# Patient Record
Sex: Female | Born: 1955
Health system: Southern US, Community
[De-identification: ages and names within clinical notes are randomized; demographics above are authoritative.]

## PROBLEM LIST (undated history)

## (undated) DIAGNOSIS — F419 Anxiety disorder, unspecified: Secondary | ICD-10-CM

## (undated) DIAGNOSIS — C801 Malignant (primary) neoplasm, unspecified: Secondary | ICD-10-CM

## (undated) DIAGNOSIS — H269 Unspecified cataract: Secondary | ICD-10-CM

## (undated) DIAGNOSIS — Z9889 Other specified postprocedural states: Secondary | ICD-10-CM

## (undated) DIAGNOSIS — F32A Depression, unspecified: Secondary | ICD-10-CM

## (undated) DIAGNOSIS — F329 Major depressive disorder, single episode, unspecified: Secondary | ICD-10-CM

## (undated) DIAGNOSIS — M1990 Unspecified osteoarthritis, unspecified site: Secondary | ICD-10-CM

## (undated) DIAGNOSIS — R112 Nausea with vomiting, unspecified: Secondary | ICD-10-CM

## (undated) DIAGNOSIS — R51 Headache: Secondary | ICD-10-CM

## (undated) HISTORY — PX: FRACTURE SURGERY: SHX138

## (undated) HISTORY — PX: OTHER SURGICAL HISTORY: SHX169

## (undated) HISTORY — DX: Anxiety disorder, unspecified: F41.9

## (undated) HISTORY — PX: TUBAL LIGATION: SHX77

## (undated) HISTORY — DX: Depression, unspecified: F32.A

## (undated) HISTORY — DX: Unspecified cataract: H26.9

## (undated) HISTORY — DX: Headache: R51

## (undated) HISTORY — PX: CHOLECYSTECTOMY: SHX55

## (undated) HISTORY — PX: ROTATOR CUFF REPAIR: SHX139

## (undated) HISTORY — PX: KNEE ARTHROSCOPY W/ MENISCAL REPAIR: SHX1877

## (undated) HISTORY — PX: COLONOSCOPY: SHX174

## (undated) HISTORY — DX: Major depressive disorder, single episode, unspecified: F32.9

## (undated) HISTORY — PX: BUNIONECTOMY: SHX129

---

## 1997-10-05 ENCOUNTER — Ambulatory Visit (HOSPITAL_COMMUNITY): Admission: RE | Admit: 1997-10-05 | Discharge: 1997-10-05 | Payer: Self-pay | Admitting: Obstetrics & Gynecology

## 1998-08-16 ENCOUNTER — Other Ambulatory Visit: Admission: RE | Admit: 1998-08-16 | Discharge: 1998-08-16 | Payer: Self-pay | Admitting: Obstetrics & Gynecology

## 1999-08-27 ENCOUNTER — Ambulatory Visit (HOSPITAL_BASED_OUTPATIENT_CLINIC_OR_DEPARTMENT_OTHER): Admission: RE | Admit: 1999-08-27 | Discharge: 1999-08-27 | Payer: Self-pay | Admitting: Orthopedic Surgery

## 1999-11-23 ENCOUNTER — Other Ambulatory Visit: Admission: RE | Admit: 1999-11-23 | Discharge: 1999-11-23 | Payer: Self-pay | Admitting: Obstetrics & Gynecology

## 2000-12-23 ENCOUNTER — Other Ambulatory Visit: Admission: RE | Admit: 2000-12-23 | Discharge: 2000-12-23 | Payer: Self-pay | Admitting: Obstetrics & Gynecology

## 2001-12-28 ENCOUNTER — Other Ambulatory Visit: Admission: RE | Admit: 2001-12-28 | Discharge: 2001-12-28 | Payer: Self-pay | Admitting: Obstetrics & Gynecology

## 2003-04-18 ENCOUNTER — Other Ambulatory Visit: Admission: RE | Admit: 2003-04-18 | Discharge: 2003-04-18 | Payer: Self-pay | Admitting: Obstetrics & Gynecology

## 2004-04-19 ENCOUNTER — Other Ambulatory Visit: Admission: RE | Admit: 2004-04-19 | Discharge: 2004-04-19 | Payer: Self-pay | Admitting: Obstetrics & Gynecology

## 2004-08-21 ENCOUNTER — Ambulatory Visit (HOSPITAL_COMMUNITY): Admission: RE | Admit: 2004-08-21 | Discharge: 2004-08-21 | Payer: Self-pay | Admitting: Gastroenterology

## 2005-05-29 ENCOUNTER — Other Ambulatory Visit: Admission: RE | Admit: 2005-05-29 | Discharge: 2005-05-29 | Payer: Self-pay | Admitting: Obstetrics & Gynecology

## 2006-01-28 ENCOUNTER — Ambulatory Visit (HOSPITAL_COMMUNITY): Admission: RE | Admit: 2006-01-28 | Discharge: 2006-01-28 | Payer: Self-pay | Admitting: Gastroenterology

## 2006-06-19 ENCOUNTER — Ambulatory Visit: Payer: Self-pay | Admitting: Vascular Surgery

## 2006-12-26 ENCOUNTER — Emergency Department (HOSPITAL_COMMUNITY): Admission: EM | Admit: 2006-12-26 | Discharge: 2006-12-26 | Payer: Self-pay | Admitting: Emergency Medicine

## 2007-07-06 ENCOUNTER — Encounter: Admission: RE | Admit: 2007-07-06 | Discharge: 2007-07-06 | Payer: Self-pay | Admitting: Obstetrics & Gynecology

## 2008-05-02 ENCOUNTER — Emergency Department (HOSPITAL_COMMUNITY): Admission: EM | Admit: 2008-05-02 | Discharge: 2008-05-02 | Payer: Self-pay | Admitting: Family Medicine

## 2008-05-11 ENCOUNTER — Encounter: Admission: RE | Admit: 2008-05-11 | Discharge: 2008-05-11 | Payer: Self-pay | Admitting: Obstetrics & Gynecology

## 2008-08-05 ENCOUNTER — Encounter: Admission: RE | Admit: 2008-08-05 | Discharge: 2008-08-05 | Payer: Self-pay | Admitting: Obstetrics & Gynecology

## 2008-10-28 ENCOUNTER — Emergency Department (HOSPITAL_COMMUNITY): Admission: EM | Admit: 2008-10-28 | Discharge: 2008-10-28 | Payer: Self-pay | Admitting: Family Medicine

## 2009-08-11 ENCOUNTER — Encounter: Admission: RE | Admit: 2009-08-11 | Discharge: 2009-08-11 | Payer: Self-pay | Admitting: Obstetrics & Gynecology

## 2010-02-12 ENCOUNTER — Encounter: Admission: RE | Admit: 2010-02-12 | Discharge: 2010-02-12 | Payer: Self-pay | Admitting: Family Medicine

## 2010-04-08 ENCOUNTER — Encounter: Payer: Self-pay | Admitting: Gastroenterology

## 2010-07-26 ENCOUNTER — Other Ambulatory Visit: Payer: Self-pay | Admitting: Obstetrics & Gynecology

## 2010-07-26 DIAGNOSIS — Z1231 Encounter for screening mammogram for malignant neoplasm of breast: Secondary | ICD-10-CM

## 2010-08-03 NOTE — Op Note (Signed)
[place]  Patient:    Sandra Clay, Sandra Clay                          MRN: [id number] Proc. Date: 08/27/99 Adm. Date:  [id number] Disc. Date: [id number] Attending:  Twana First                           Operative Report  PREOPERATIVE DIAGNOSIS:  Right knee medial meniscus tear.  POSTOPERATIVE DIAGNOSIS:  Right knee medial and lateral meniscal tears.  OPERATION:  Right knee EUA followed by arthroscopic partial medial and lateral meniscectomies.  SURGEON:  Elana Alm. Thurston Hole, M.D.  ASSISTANT:  Kirstin Adelberger, P.A.  ANESTHESIA:  Local MAC  OPERATIVE TIME:  30 minutes  COMPLICATIONS:  None.  INDICATION FOR PROCEDURE:  Ms. Brull is a 55 year old woman who has had six months of increasing right knee pain secondary to a twisting injury with signs and symptoms and MRI documenting meniscal tear who has failed conservative care and is now to undergo arthroscopy.  DESCRIPTION OF PROCEDURE:  Ms. Pellicano was brought to the operating room on August 27, 1999 after a block had been placed in the holding room. Placed on the operative table in the supine position. Right knee was examined under anesthesia.  Full range of motion.  1+ Lachman with a firm end point and an otherwise stable ligamentous exam and normal patellar tracking.  The knee was prepped using sterile Betadine and draped using sterile technique. Originally through an inferior lateral portal the arthroscope with a pump attached was placed and through an inferior medial portal an arthroscopic probe was placed. On initial inspection of the medial compartment, she had grade I and mild grade II changes median femoral condyle and medial tibial plateau which was debrided.  Medial meniscus tear noted posterior horn of which 30% was resected back to a stable rim.  Intercondylar notch inspected.  Anterior cruciate was intact but moderately lax with 3 to 5 mm of anterior laxity but did have an end point.  Posterior cruciate was intact and stable.  Lateral compartment was inspected.  Arterial cartilage, lateral femoral condyle and lateral tibial plateau showed mild grade I to II changes.  Lateral meniscus was probed. She had a tear of the lateral horn of which 20% was resected back to a stable rim. The patellar femoral joint showed mild grade I to II changes.   Patella tracked normally.  Moderate synovitis in the medial lateral gutters were debrided otherwise they are free of pathology.  After this was done and it is felt that all pathology had been satisfactorily addressed, the instruments were removed.  Portal was closed with 3-0 nylon suture after an intra-articular catheter was placed for postoperative pain pump due to the fact that the patient could not tolerate narcotics for pain medication.  We elected to use this postoperative pain pump. The catheter was placed in the inferior medial portal and then the portal was closed with 3-0 nylon suture and then also the joint was injected with 0.25% Marcaine with epinephrine and 5 mg of morphine and then the pump was set up as well. Sterile dressings were applied.  She did receive Ancef 1 gm IV intraoperatively for prophylaxis.  FOLLOW UP CARE:  She will be treated as an outpatient on ibuprofen and with her postoperative pain pump monitored closely.  See her back in the office in a week  for sutures out and follow up. The pain pump will be used for the next 48 hours. DD:  08/27/99 TD:  08/29/99 Job: [id number]

## 2010-08-03 NOTE — Op Note (Signed)
NAMEDIM, MEISINGER                   ACCOUNT NO.:  [id number]   MEDICAL RECORD NO.:  [id number]          PATIENT TYPE:  AMB   LOCATION:  ENDO                         FACILITY:  [facility]   PHYSICIAN:  Bernette Redbird, M.D.   DATE OF BIRTH:  Sep 30, 1955   DATE OF PROCEDURE:  08/21/2004  DATE OF DISCHARGE:                                 OPERATIVE REPORT   PROCEDURE:  Colonoscopy.   INDICATIONS:  Colon cancer screening (initial exam) in a patient without  worrisome risk factors or symptoms.   FINDINGS:  Normal exam to the terminal ileum.   DESCRIPTION OF PROCEDURE:  The nature, purpose and risks of the procedure  had been discussed with the patient who provided written consent. Sedation  was fentanyl 100 mcg and Versed 10 mg IV without arrhythmias or  desaturation. The Olympus adjustable tension pediatric video colonoscope was  advanced to the terminal ileum which had a normal appearance and pullback  was then performed. The quality of the prep was excellent and it is felt  that all areas were well seen.   This was a normal examination. No polyps, cancer, colitis, vascular  malformations or diverticulosis were noted. Retroflexion in the rectum  showed small internal hemorrhoids. Reinspection of the rectum was  unremarkable. No biopsies were obtained. The patient tolerated the procedure  well and there were no apparent complications.   IMPRESSION:  Normal screening colonoscopy.   PLAN:  Consider flexible sigmoidoscopy in five years for continued  screening.      RB/MEDQ  D:  08/21/2004  T:  08/21/2004  Job:  [id number]   cc:   Freddy Finner, M.D.  Fax: [phone]

## 2010-08-14 ENCOUNTER — Ambulatory Visit
Admission: RE | Admit: 2010-08-14 | Discharge: 2010-08-14 | Disposition: A | Discharge status: Home / Self Care | Payer: PRIVATE HEALTH INSURANCE | Source: Ambulatory Visit | Attending: Obstetrics & Gynecology | Admitting: Obstetrics & Gynecology

## 2010-08-14 DIAGNOSIS — Z1231 Encounter for screening mammogram for malignant neoplasm of breast: Secondary | ICD-10-CM

## 2010-12-27 LAB — POCT URINALYSIS DIP (DEVICE)
Ketones, ur: NEGATIVE
Nitrite: POSITIVE — AB
Operator id: 116391
Protein, ur: NEGATIVE
Urobilinogen, UA: 0.2
pH: 6

## 2011-06-26 ENCOUNTER — Other Ambulatory Visit: Payer: Self-pay | Admitting: Internal Medicine

## 2011-09-06 ENCOUNTER — Other Ambulatory Visit: Payer: Self-pay | Admitting: Obstetrics & Gynecology

## 2011-09-06 DIAGNOSIS — Z1231 Encounter for screening mammogram for malignant neoplasm of breast: Secondary | ICD-10-CM

## 2011-09-16 ENCOUNTER — Ambulatory Visit
Admission: RE | Admit: 2011-09-16 | Discharge: 2011-09-16 | Disposition: A | Discharge status: Home / Self Care | Payer: 59 | Source: Ambulatory Visit | Attending: Obstetrics & Gynecology | Admitting: Obstetrics & Gynecology

## 2011-09-16 DIAGNOSIS — Z1231 Encounter for screening mammogram for malignant neoplasm of breast: Secondary | ICD-10-CM

## 2012-05-14 ENCOUNTER — Ambulatory Visit (INDEPENDENT_AMBULATORY_CARE_PROVIDER_SITE_OTHER): Payer: 59 | Admitting: Licensed Clinical Social Worker

## 2012-05-14 ENCOUNTER — Encounter (HOSPITAL_COMMUNITY): Payer: Self-pay | Admitting: Licensed Clinical Social Worker

## 2012-05-14 DIAGNOSIS — F4323 Adjustment disorder with mixed anxiety and depressed mood: Secondary | ICD-10-CM
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
## 2012-05-21 ENCOUNTER — Ambulatory Visit (INDEPENDENT_AMBULATORY_CARE_PROVIDER_SITE_OTHER): Payer: 59 | Admitting: Licensed Clinical Social Worker

## 2012-05-21 DIAGNOSIS — F4323 Adjustment disorder with mixed anxiety and depressed mood: Secondary | ICD-10-CM
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
## 2012-05-28 ENCOUNTER — Ambulatory Visit (INDEPENDENT_AMBULATORY_CARE_PROVIDER_SITE_OTHER): Payer: 59 | Admitting: Licensed Clinical Social Worker

## 2012-05-28 DIAGNOSIS — F4323 Adjustment disorder with mixed anxiety and depressed mood: Secondary | ICD-10-CM
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
## 2012-06-04 ENCOUNTER — Ambulatory Visit (INDEPENDENT_AMBULATORY_CARE_PROVIDER_SITE_OTHER): Payer: 59 | Admitting: Licensed Clinical Social Worker

## 2012-06-04 DIAGNOSIS — F4323 Adjustment disorder with mixed anxiety and depressed mood: Secondary | ICD-10-CM
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
## 2012-06-11 ENCOUNTER — Ambulatory Visit (INDEPENDENT_AMBULATORY_CARE_PROVIDER_SITE_OTHER): Payer: 59 | Admitting: Licensed Clinical Social Worker

## 2012-06-11 DIAGNOSIS — F4323 Adjustment disorder with mixed anxiety and depressed mood: Secondary | ICD-10-CM

## 2012-06-11 NOTE — Progress Notes (Signed)
   THERAPIST PROGRESS NOTE  Session Time: 3:00pm-3:50pm  Participation Level: Active  Behavioral Response: Well GroomedAlertAnxious  Type of Therapy: Individual Therapy  Treatment Goals addressed: Coping  Interventions: CBT, Solution Focused, Strength-based, Supportive, Family Systems and Reframing  Summary: Sandra Clay is a 57 y.o. female who presents with anxious mood and affect. She reports that she only talked to one of her son's and avoided the other one because cutting off his phone feels like she is not loving him. She is willing to focus on this again this week. She feels disconnected from her daughter, feels as though she is neglecting her and has increased anxiety as a result of this. She is now exercising each night and feels good physically, but feels that this is more time spent away from her daughter. She struggles to find a way to express her love towards her daughter without rescuing her. She was able to step back and allow her daughter to experience natural negative consequences for not doing her homework. She felt the desire to call the teacher and the tutor and tell them not to come to the house, but she resisted. Her sleep and appetite are wnl.    Suicidal/Homicidal: Nowithout intent/plan  Therapist Response: Assessed patients current functioning and reviewed progress. Reviewed coping strategies. Assessed patients safety and assisted in identifying protective factors.  Reviewed crisis plan with patient. Assisted patient with the expression of anxiety. Reviewed patients self care plan. Assessed progress related to self care. Patients self care is good. Recommend daily exercise, increased socialization and recreation. Used CBT to assist patient with the identification of negative distortions and irrational thoughts. Encouraged patient to verbalize alternative and factual responses which challenge thought distortions. Reviewed healthy boundaries and assertive communication.  Patient is making consistent progress despite her increased anxiety. She needs frequent reassurance that her change in behavior towards her daughter is not neglectful, but rather an expression of love. Discussed talking to her daughter about the changes in their interactions and communication, to look for opportunities to connect.   Plan: Return again in one weeks.  Diagnosis: Axis I: Adjustment Disorder with Mixed Emotional Features    Axis II: No diagnosis    Alyce Inscore, LCSW 06/11/2012

## 2012-06-18 ENCOUNTER — Ambulatory Visit (INDEPENDENT_AMBULATORY_CARE_PROVIDER_SITE_OTHER): Payer: 59 | Admitting: Licensed Clinical Social Worker

## 2012-06-18 DIAGNOSIS — F4323 Adjustment disorder with mixed anxiety and depressed mood: Secondary | ICD-10-CM
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
## 2012-06-23 ENCOUNTER — Ambulatory Visit (INDEPENDENT_AMBULATORY_CARE_PROVIDER_SITE_OTHER): Payer: 59 | Admitting: Licensed Clinical Social Worker

## 2012-06-23 DIAGNOSIS — F4323 Adjustment disorder with mixed anxiety and depressed mood: Secondary | ICD-10-CM

## 2012-06-23 NOTE — Progress Notes (Signed)
   THERAPIST PROGRESS NOTE  Session Time: 8:30am-9:20am  Participation Level: Active  Behavioral Response: Well GroomedAlertAnxious  Type of Therapy: Individual Therapy  Treatment Goals addressed: Coping  Interventions: CBT, Solution Focused, Strength-based, Supportive and Reframing  Summary: Sandra Clay is a 57 y.o. female who presents with anxious mood and bright affect. She is pleased that she was able to follow up on one of her goals regarding telling her son in [place] that she will no longer pay for his phone bill. She processes her anxiety related to doing this and how she had to couple this with gifts for Easter to tolerate the experience herself.  She reports receiving an orchid from her daughter as a gift and how this is the first time she has ever received anything from her like this. She continues to struggle with the lack of movement in her daughters life. Every night she struggles with an argument about what to eat for dinner. Patient realizes that she tries to offer her daughter many choices for dinner only to feel hurt and frustrated. She wants to find a way to avoid this conflict every night.   Suicidal/Homicidal: Nowithout intent/plan  Therapist Response: Assessed patients current functioning and reviewed progress. Reviewed coping strategies. Assessed patients safety and assisted in identifying protective factors.  Reviewed crisis plan with patient. Assisted patient with the expression of frustration and guilt. Reviewed patients self care plan. Assessed progress related to self care. Patients self care is good. Recommend daily exercise, increased socialization and recreation. Reviewed healthy boundaries and assertive communication. Used CBT to assist patient with the identification of negative distortions and irrational thoughts. Encouraged patient to verbalize alternative and factual responses which challenge thought distortions. Discussed a specific plan of communication to address  dinner time conflict with daughter and put responsibility back on daughter.   Plan: Return again in one weeks.  Diagnosis: Axis I: Adjustment Disorder with Mixed Emotional Features    Axis II: No diagnosis    Antron Seth, LCSW 06/23/2012

## 2012-06-25 ENCOUNTER — Ambulatory Visit (HOSPITAL_COMMUNITY): Payer: Self-pay | Admitting: Licensed Clinical Social Worker

## 2012-07-01 ENCOUNTER — Ambulatory Visit (INDEPENDENT_AMBULATORY_CARE_PROVIDER_SITE_OTHER): Payer: 59 | Admitting: Licensed Clinical Social Worker

## 2012-07-01 DIAGNOSIS — F4323 Adjustment disorder with mixed anxiety and depressed mood: Secondary | ICD-10-CM

## 2012-07-01 NOTE — Progress Notes (Signed)
   THERAPIST PROGRESS NOTE  Session Time: 8:30am-9:20am  Participation Level: Active  Behavioral Response: Well GroomedAlertAnxious  Type of Therapy: Individual Therapy  Treatment Goals addressed: Anxiety and Coping  Interventions: CBT, Solution Focused, Strength-based, Supportive and Reframing  Summary: Sandra Clay is a 57 y.o. female who presents with anxious mood and affect. She reports having a very stressful and emotional week due to poor choices her daughter has made. She describes calling home and finding her daughter drunk or high. She processes her anger and fear over her daughters poor life choices. She processes her doubt that her and her ex-husband may have made the wrong decision to allow their daughter to go to the beach for Spring break. She is angry over how poorly this trip has been handled by the parent in charge. She questions her reactions in comparison to others since other parents are responding quite differently. She feels that she is not a good parent. Her sleep has been disrupted by this stress. Her appetite is wnl.    Suicidal/Homicidal: Nowithout intent/plan  Therapist Response: Assessed patients current functioning and reviewed progress. Reviewed coping strategies. Assessed patients safety and assisted in identifying protective factors.  Reviewed crisis plan with patient. Assisted patient with the expression of anxiety and fear. Reviewed patients self care plan. Assessed progress related to self care. Patients self care is good. Recommend daily exercise, increased socialization and recreation. Used CBT to assist patient with the identification of negative distortions and irrational thoughts. Encouraged patient to verbalize alternative and factual responses which challenge thought distortions. Discussed next steps over holding her daughter responsible for her poor choices. Discussed specific means of communication.   Plan: Return again in one weeks.  Diagnosis: Axis  I: Adjustment Disorder with Mixed Emotional Features    Axis II: No diagnosis    Zeth Buday, LCSW 07/01/2012

## 2012-07-08 ENCOUNTER — Ambulatory Visit (INDEPENDENT_AMBULATORY_CARE_PROVIDER_SITE_OTHER): Payer: 59 | Admitting: Licensed Clinical Social Worker

## 2012-07-08 DIAGNOSIS — F4323 Adjustment disorder with mixed anxiety and depressed mood: Secondary | ICD-10-CM

## 2012-07-08 NOTE — Progress Notes (Signed)
   THERAPIST PROGRESS NOTE  Session Time: 8:30am-9:20am  Participation Level: Active  Behavioral Response: Well GroomedAlertAnxious  Type of Therapy: Individual Therapy  Treatment Goals addressed: Anxiety and Coping  Interventions: CBT, Solution Focused, Supportive and Reframing  Summary: Sandra Clay is a 57 y.o. female who presents with anxious mood and bright affect. She is pleased to report that her daughter came home safely from the beach and describes her efforts to talk to her daughter about her substance abuse. She is uncertain how to proceed with the urine drug screens and plans to discuss this in her daughters session tomorrow with Cleophas Dunker, LMFT. She is pleased that she has been able to connect with her daughter a bit more through cooking and sharing a meal together. She processes her frustration with her boyfriend and how she is not made to feel a priority with him.  She is uncertain how to navigate a major ongoing conflict in their relationship. Her sleep and appetite are wnl.   Suicidal/Homicidal: Nowithout intent/plan  Therapist Response: Assessed patients current functioning and reviewed progress. Reviewed coping strategies. Assessed patients safety and assisted in identifying protective factors.  Reviewed crisis plan with patient. Assisted patient with the expression of frustration and anxiety. Reviewed patients self care plan. Assessed progress related to self care. Patients self care is good. Recommend daily exercise, increased socialization and recreation. Used CBT to assist patient with the identification of negative distortions and irrational thoughts. Encouraged patient to verbalize alternative and factual responses which challenge thought distortions. Reviewed healthy boundaries and assertive communication.   Plan: Return again in one weeks.  Diagnosis: Axis I: Adjustment Disorder with Mixed Emotional Features    Axis II: No diagnosis    Claudie Rathbone,  LCSW 07/08/2012

## 2012-07-15 ENCOUNTER — Ambulatory Visit (INDEPENDENT_AMBULATORY_CARE_PROVIDER_SITE_OTHER): Payer: 59 | Admitting: Licensed Clinical Social Worker

## 2012-07-15 DIAGNOSIS — F4323 Adjustment disorder with mixed anxiety and depressed mood: Secondary | ICD-10-CM

## 2012-07-15 NOTE — Progress Notes (Signed)
   THERAPIST PROGRESS NOTE  Session Time: 8:30am-9:20am  Participation Level: Active  Behavioral Response: Well GroomedAlertAnxious  Type of Therapy: Individual Therapy  Treatment Goals addressed: Anxiety and Coping  Interventions: CBT, Solution Focused, Strength-based, Supportive and Reframing  Summary: Sandra Clay is a 57 y.o. female who presents with euthymic mood and anxious affect. She reports that the situation with her daughter remains the same. She has begun drug testing and her daughter tested positive for THC and amphetamines from [facility]. She is not surprised by this test result. She remains frustrated with her daughters lack of willingness to participate in treatment and she questions what will become of her daughter. She frequently imagines her daughters life as she would like it to be. She continues to experience ongoing frustration in her romantic relationship and endorses growing feelings of resentment and anger. She questions if and how she can feel differently about the situation. She wants to be first in her boyfriends life and is not. She remains busy at work and enjoys her job. Her sleep and appetite are wnl.   Suicidal/Homicidal: Nowithout intent/plan  Therapist Response: Assessed patients current functioning and reviewed progress. Reviewed coping strategies. Assessed patients safety and assisted in identifying protective factors.  Reviewed crisis plan with patient. Assisted patient with the expression of frustration. Reviewed patients self care plan. Assessed progress related to self care. Patients self care is good. Recommend daily exercise, increased socialization and recreation. Reviewed healthy boundaries and assertive communication. Used CBT to assist patient with the identification of negative distortions and irrational thoughts. Encouraged patient to verbalize alternative and factual responses which challenge thought distortions. Patient continues to make strides in  managing her anxiety related to her daughter. She plans to consider expressing herself differently with her boyfriend.   Plan: Return again in one weeks.  Diagnosis: Axis I: Adjustment Disorder with Mixed Emotional Features    Axis II: No diagnosis    Lyn Joens, LCSW 07/15/2012

## 2012-07-23 ENCOUNTER — Telehealth (HOSPITAL_COMMUNITY): Payer: Self-pay

## 2012-07-23 ENCOUNTER — Ambulatory Visit (HOSPITAL_COMMUNITY): Payer: Self-pay | Admitting: Licensed Clinical Social Worker

## 2012-07-31 ENCOUNTER — Ambulatory Visit (INDEPENDENT_AMBULATORY_CARE_PROVIDER_SITE_OTHER): Payer: 59 | Admitting: Licensed Clinical Social Worker

## 2012-07-31 DIAGNOSIS — F4323 Adjustment disorder with mixed anxiety and depressed mood: Secondary | ICD-10-CM

## 2012-07-31 NOTE — Progress Notes (Signed)
   THERAPIST PROGRESS NOTE  Session Time: 8:30am-9:20am  Participation Level: Active  Behavioral Response: Well GroomedAlertAnxious and tearful  Type of Therapy: Individual Therapy  Treatment Goals addressed: Coping  Interventions: CBT, DBT, Strength-based, Supportive and Reframing  Summary: Sandra Clay is a 57 y.o. female who presents with anxious mood and tearful affect. She reports that that the past two weeks have been terrible. Her daughter has quit therapy and patient discovered more drugs in her daughters car, her room and saw that she pawned something on her twitter account. She tried to take away her daughters phone and car but she reports that her daughters sadness and despair are to painful for her to see and experience that she was unable to uphold this consequence. She becomes tearful throughout the session as she describes her fear that her daughter will kill herself in response to patients limit setting. She endorses shame associated with this and feels she is a "bad parent". She endorses feelings of hopelessness and helplessness about this situation. She understands that her daughter needs to attend rehab, but does not know what to do because her daughter refuses to go and patient does not have the financial resources to send her somewhere. She is able to work, but her sleep has been disrupted due to stress and her appetite is wnl.    Suicidal/Homicidal: Nowithout intent/plan  Therapist Response: Assessed patients current functioning and reviewed progress. Reviewed coping strategies. Assessed patients safety and assisted in identifying protective factors.  Reviewed crisis plan with patient. Assisted patient with the expression of fear and anxiety. Reviewed patients self care plan. Assessed progress related to self care. Patients self care is good. Recommend daily exercise, increased socialization and recreation. Reviewed healthy boundaries and assertive communication. Used CBT to  assist patient with the identification of negative distortions and irrational thoughts. Encouraged patient to verbalize alternative and factual responses which challenge thought distortions. Used DBT to practice mindfulness, review distraction list and improve distress tolerance skills. Used motivational interviewing to assist and encourage patient through the change process. Explored patients barriers to change. Patient struggles with distress tolerance. Her fear over her daughters manipulative use of suicidal threats paralyzes and she frequently states that she "can't" do it. Discussed consequences to paitent of allowing her daughter to avoid natural consequences associated with drug use. Patient left with a plan to systematically increase her distress with small steps she feels she can manage.   Plan: Return again in one weeks.  Diagnosis: Axis I: Adjustment Disorder with Mixed Emotional Features    Axis II: No diagnosis    Florabel Faulks, LCSW 07/31/2012

## 2012-08-06 ENCOUNTER — Ambulatory Visit (INDEPENDENT_AMBULATORY_CARE_PROVIDER_SITE_OTHER): Payer: 59 | Admitting: Licensed Clinical Social Worker

## 2012-08-06 DIAGNOSIS — F4323 Adjustment disorder with mixed anxiety and depressed mood: Secondary | ICD-10-CM

## 2012-08-06 NOTE — Progress Notes (Signed)
   THERAPIST PROGRESS NOTE  Session Time: 4:00pm-4:50pm  Participation Level: Active  Behavioral Response: Well GroomedAlertAnxious and Euthymic  Type of Therapy: Individual Therapy  Treatment Goals addressed: Coping  Interventions: CBT, DBT, Strength-based, Supportive and Reframing  Summary: Sandra Clay is a 57 y.o. female who presents with euthymic mood and anxious affect. She reports that she is pleased with the progress she is making. She has been focusing on increasing her distress tolerance skills and tolerating her daughters unhappiness. She admits that this will be easier in the summer because she her daughter won't be in school and she won't be concerned about "runing her day". She admits that this is an excuse. She processes her frustration and hopelessness over her daughter. She expresses fear that she will one day find her dead. She wants to bring her boyfriend in next session and talk about how she feels he judges her daughter and how painful that is for her. Her sleep and appetite are wnl.    Suicidal/Homicidal: Nowithout intent/plan  Therapist Response: Assessed patients current functioning and reviewed progress. Reviewed coping strategies. Assessed patients safety and assisted in identifying protective factors.  Reviewed crisis plan with patient. Assisted patient with the expression of fear and anxiety. Reviewed patients self care plan. Assessed progress related to self care. Patients self care is good. Recommend daily exercise, increased socialization and recreation. Used CBT to assist patient with the identification of negative distortions and irrational thoughts. Encouraged patient to verbalize alternative and factual responses which challenge thought distortions. Used DBT to practice mindfulness, review distraction list and improve distress tolerance skills. Reviewed healthy boundaries and assertive communication.   Plan: Return again in one weeks.  Diagnosis: Axis I:  Adjustment Disorder with Mixed Emotional Features    Axis II: Deferred    Firmin Belisle, LCSW 08/06/2012

## 2012-08-12 ENCOUNTER — Ambulatory Visit (INDEPENDENT_AMBULATORY_CARE_PROVIDER_SITE_OTHER): Payer: 59 | Admitting: Licensed Clinical Social Worker

## 2012-08-12 DIAGNOSIS — F4323 Adjustment disorder with mixed anxiety and depressed mood: Secondary | ICD-10-CM

## 2012-08-12 NOTE — Progress Notes (Signed)
   THERAPIST PROGRESS NOTE  Session Time: 8:30am-9:20am  Participation Level: Active  Behavioral Response: Well GroomedAlertAnxious  Type of Therapy: Family Therapy  Treatment Goals addressed: Coping  Interventions: CBT, DBT, Strength-based, Supportive and Reframing  Summary: Sandra Clay is a 57 y.o. female who presents with her boyfriend Sandra Clay. She has brought him along today to discuss their relationship and how it is impacted by her daughter. Patient reports that her daughter continues with the same behavior. She endorses feelings helplessness and is concerned about how this impacts her relationship with Sandra Clay. She has spoken to him about his frustration and expression of this which upsets her at times. Both patient and Sandra Clay process how they are impacted, how their time together is limited and driven by [name]. Patient expresses concern that this stress will drive Sandra Clay away. She is able to express her need for reassurance. Patient expresses her continued struggle to tolerate her daughters pain.   Suicidal/Homicidal: Nowithout intent/plan  Therapist Response: Assessed patients current functioning and reviewed progress. Reviewed coping strategies. Assessed patients safety and assisted in identifying protective factors.  Reviewed crisis plan with patient. Assisted patient with the expression of anxiety and fear. Reviewed patients self care plan. Assessed progress related to self care. Patients self care is good. Recommend daily exercise, increased socialization and recreation. Used CBT to assist patient with the identification of negative distortions and irrational thoughts. Encouraged patient to verbalize alternative and factual responses which challenge thought distortions. Used DBT to practice mindfulness, review distraction list and improve distress tolerance skills. Reviewed healthy boundaries and assertive communication. Processed and normalized patients grief reaction.   Plan: Return  again in one weeks.  Diagnosis: Axis I: Adjustment Disorder with Mixed Emotional Features    Axis II: No diagnosis    Willoughby Doell, LCSW 08/12/2012

## 2012-08-19 ENCOUNTER — Ambulatory Visit (INDEPENDENT_AMBULATORY_CARE_PROVIDER_SITE_OTHER): Payer: 59 | Admitting: Licensed Clinical Social Worker

## 2012-08-19 DIAGNOSIS — F4323 Adjustment disorder with mixed anxiety and depressed mood: Secondary | ICD-10-CM

## 2012-08-19 NOTE — Progress Notes (Signed)
   THERAPIST PROGRESS NOTE  Session Time: 8:30am-9:20am  Participation Level: Active  Behavioral Response: Well GroomedAlertAnxious and Euthymic  Type of Therapy: Individual Therapy  Treatment Goals addressed: Coping  Interventions: CBT, DBT, Motivational Interviewing, Solution Focused, Strength-based, Supportive and Reframing  Summary: Sandra Clay is a 57 y.o. female who presents with euthymic mood and anxious affect. She reports that both her and her boyfriend found the last session very helpful. She has an improved understanding of her boyfriends intentions and frustrations and is able to understand that his frustration does not reflect upon his feelings about her daughter. Patient processes her frustration with her boyfriend's poor attempts to assert himself on patients behalf in front of his ex wife and children. She explores her options and how she plans to interact with his children when they are on vacation together. She continues to focus on communication of boundaries and expectations with her daughter. She has a desire to "make things happen" for her over the summer, but is able to redirect her intentions to the realization that her daughter is responsible for her own wellness. She seeks assistance with communicating her expectations about her daughters drug use and how she does not clean up after herself. She is sleeping and eating well.    Suicidal/Homicidal: Nowithout intent/plan  Therapist Response: Assessed patients current functioning and reviewed progress. Reviewed coping strategies. Assessed patients safety and assisted in identifying protective factors.  Reviewed crisis plan with patient. Assisted patient with the expression of frustration. Reviewed patients self care plan. Assessed progress related to self care. Patients self care is good. Recommend daily exercise, increased socialization and recreation. Used CBT to assist patient with the identification of negative  distortions and irrational thoughts. Encouraged patient to verbalize alternative and factual responses which challenge thought distortions. Reviewed healthy boundaries and assertive communication. Used DBT to practice mindfulness, review distraction list and improve distress tolerance skills. Patient is developing improved distress tolerance skills for her daughters sadness.   Plan: Return again in one weeks.  Diagnosis: Axis I: Adjustment Disorder with Mixed Emotional Features    Axis II: No diagnosis    Emmarae Cowdery, LCSW 08/19/2012

## 2012-08-27 ENCOUNTER — Ambulatory Visit (HOSPITAL_COMMUNITY): Payer: Self-pay | Admitting: Licensed Clinical Social Worker

## 2012-09-03 ENCOUNTER — Ambulatory Visit (INDEPENDENT_AMBULATORY_CARE_PROVIDER_SITE_OTHER): Payer: 59 | Admitting: Licensed Clinical Social Worker

## 2012-09-03 DIAGNOSIS — F4323 Adjustment disorder with mixed anxiety and depressed mood: Secondary | ICD-10-CM

## 2012-09-03 NOTE — Progress Notes (Signed)
   THERAPIST PROGRESS NOTE  Session Time: 8:30am-9:20am  Participation Level: Active  Behavioral Response: Well GroomedAlertAnxious  Type of Therapy: Individual Therapy  Treatment Goals addressed: Coping  Interventions: CBT, Strength-based, Supportive and Reframing  Summary: Sandra Clay is a 57 y.o. female who presents with anxious mood and affect. She reports that things are about the same at home and she continues to experience stress related to her daughter's depression and her lack of motivation to make changes in her life. She continues to struggle with tolerance around her daughters sadness, but she is making progress in this area. She wants to get her daughter involved in activities over the summer and has come up with a spread sheet outlining her goals for her. She is eager to see her daughter make changes that her daughter is not interested in making. She struggles to adjust her hopes for her daughter to her daughters readiness for change. Her sleep and appetite are wnl.   Suicidal/Homicidal: Nowithout intent/plan  Therapist Response: Assessed patients current functioning and reviewed progress. Reviewed coping strategies. Assessed patients safety and assisted in identifying protective factors.  Reviewed crisis plan with patient. Assisted patient with the expression of frustration. Reviewed patients self care plan. Assessed progress related to self care. Patients self care is good. Recommend daily exercise, increased socialization and recreation. Reviewed healthy boundaries and assertive communication. Used CBT to assist patient with the identification of negative distortions and irrational thoughts. Encouraged patient to verbalize alternative and factual responses which challenge thought distortions. Used DBT to practice mindfulness, review distraction list and improve distress tolerance skills.   Plan: Return again in one weeks.  Diagnosis: Axis I: Adjustment Disorder with Mixed  Emotional Features    Axis II: No diagnosis    Joban Colledge, LCSW 09/03/2012

## 2012-09-10 ENCOUNTER — Ambulatory Visit (HOSPITAL_COMMUNITY): Payer: Self-pay | Admitting: Licensed Clinical Social Worker

## 2012-09-17 ENCOUNTER — Ambulatory Visit (INDEPENDENT_AMBULATORY_CARE_PROVIDER_SITE_OTHER): Payer: 59 | Admitting: Licensed Clinical Social Worker

## 2012-09-17 DIAGNOSIS — F4323 Adjustment disorder with mixed anxiety and depressed mood: Secondary | ICD-10-CM

## 2012-09-17 NOTE — Progress Notes (Signed)
   THERAPIST PROGRESS NOTE  Session Time: 8:30am-9:20am  Participation Level: Active  Behavioral Response: Well GroomedAlertAnxious  Type of Therapy: Individual Therapy  Treatment Goals addressed: Coping  Interventions: CBT, DBT, Play Therapy, Supportive and Reframing  Summary: Sandra Clay is a 57 y.o. female who presents with euthymic mood and anxious affect. She reports that her and her daughter went on a vacation and that she was surprised by her daughters positive response many times during the vacation. She remains concerned and fearful for her daughters well being and her future. She is busy at work and endorses stress related to this. She endorses anxiety, sadness and frustration with her boyfriend and this lack of clear commitment. She processes her fear to allow him to see how much she is hurt by his actions as she is fearful that he will leave. She continues to work on building distress tolerance skills. Her sleep and appetite are wnl.    Suicidal/Homicidal: Nowithout intent/plan  Therapist Response: Assessed patients current functioning and reviewed progress. Reviewed coping strategies. Assessed patients safety and assisted in identifying protective factors.  Reviewed crisis plan with patient. Assisted patient with the expression of anxiety and fear. Reviewed patients self care plan. Assessed progress related to self care. Patients self care is very good. Recommend daily exercise, increased socialization and recreation. Reviewed healthy boundaries and assertive communication. Used CBT to assist patient with the identification of negative distortions and irrational thoughts. Encouraged patient to verbalize alternative and factual responses which challenge thought distortions. Used DBT to practice mindfulness, review distraction list and improve distress tolerance skills.   Plan: Return again in one weeks.  Diagnosis: Axis I: Adjustment Disorder with Mixed Emotional Features    Axis  II: No diagnosis    [name], LCSW 09/17/2012

## 2012-09-23 ENCOUNTER — Ambulatory Visit (INDEPENDENT_AMBULATORY_CARE_PROVIDER_SITE_OTHER): Payer: 59 | Admitting: Licensed Clinical Social Worker

## 2012-09-23 DIAGNOSIS — F4323 Adjustment disorder with mixed anxiety and depressed mood: Secondary | ICD-10-CM

## 2012-09-23 NOTE — Progress Notes (Signed)
   THERAPIST PROGRESS NOTE  Session Time: 9:30am-10:20am  Participation Level: Active  Behavioral Response: Well GroomedAlertAnxious  Type of Therapy: Individual Therapy  Treatment Goals addressed: Coping  Interventions: CBT, Solution Focused, Strength-based, Supportive and Reframing  Summary: Sandra Clay is a 57 y.o. female who presents with anxious mood and affect. She endorses anxiety as she prepares to go on vacation with her boyfriend and his children. She processes her frustration with her boyfriends behavior when around his children. She finds herself growing more intolerant of having two different lives and his refusal to fully integrate his life into her's. She reports realizing that she thinks about this "all the time" and how exhausting this is for her. Her tolerance for her daughters sadness is improving. She expresses concern over her daughters desire to return to [employer]. She explores new boundaries she wants to uphold and not to rescue her daughter from poor decisions. Her sleep and appetite are wnl.    Suicidal/Homicidal: Nowithout intent/plan  Therapist Response: Assessed patients current functioning and reviewed progress. Reviewed coping strategies. Assessed patients safety and assisted in identifying protective factors.  Reviewed crisis plan with patient. Assisted patient with the expression of anxiety and frustration. Reviewed patients self care plan. Assessed progress related to self care. Patients self care is very good. Recommend daily exercise, increased socialization and recreation. Used CBT to assist patient with the identification of negative distortions and irrational thoughts. Encouraged patient to verbalize alternative and factual responses which challenge thought distortions. Used DBT to practice mindfulness, review distraction list and improve distress tolerance skills. Reviewed healthy boundaries and assertive communication.   Plan: Return again in two  weeks.  Diagnosis: Axis I: Adjustment Disorder with Mixed Emotional Features    Axis II: No diagnosis    Danyele Smejkal, LCSW 09/23/2012

## 2012-09-24 ENCOUNTER — Other Ambulatory Visit: Payer: Self-pay

## 2012-09-24 DIAGNOSIS — Z1231 Encounter for screening mammogram for malignant neoplasm of breast: Secondary | ICD-10-CM

## 2012-10-09 ENCOUNTER — Ambulatory Visit
Admission: RE | Admit: 2012-10-09 | Discharge: 2012-10-09 | Disposition: A | Discharge status: Home / Self Care | Payer: 59 | Source: Ambulatory Visit

## 2012-10-09 DIAGNOSIS — Z1231 Encounter for screening mammogram for malignant neoplasm of breast: Secondary | ICD-10-CM

## 2012-10-19 ENCOUNTER — Ambulatory Visit (INDEPENDENT_AMBULATORY_CARE_PROVIDER_SITE_OTHER): Payer: 59 | Admitting: Licensed Clinical Social Worker

## 2012-10-19 DIAGNOSIS — F4323 Adjustment disorder with mixed anxiety and depressed mood: Secondary | ICD-10-CM

## 2012-10-19 NOTE — Progress Notes (Signed)
   THERAPIST PROGRESS NOTE  Session Time: 9:30am-10:20am  Participation Level: Active  Behavioral Response: Well GroomedAlertAnxious and Euthymic  Type of Therapy: Individual Therapy  Treatment Goals addressed: Coping  Interventions: CBT, Motivational Interviewing, Strength-based, Supportive and Reframing  Summary: Sandra Clay is a 57 y.o. female who presents with euthymic mood and bright affect. She has recently returned from a vacation with her boyfriend and his family and is pleased that this went much better than she expected. She got engaged, but it has not been made public yet. She feels patient and compassionate towards her fiancee's need for time to deal with his ex-wife. She processes her time spent with his children and her improved confidence. She continues to experience frustration with her daughter and endorses feelings of helplessness. She fears that once her daughter turns 15, she will leave home. Processed ways in which she enables her daughters drug use. Her sleep and appetite are wnl.    Suicidal/Homicidal: Nowithout intent/plan  Therapist Response: Assessed patients current functioning and reviewed progress. Reviewed coping strategies. Assessed patients safety and assisted in identifying protective factors.  Reviewed crisis plan with patient. Assisted patient with the expression of anxeity. Reviewed patients self care plan. Assessed progress related to self care. Patients self care is good. Recommend daily exercise, increased socialization and recreation. Reviewed healthy boundaries and assertive communication. Used CBT to assist patient with the identification of negative distortions and irrational thoughts. Encouraged patient to verbalize alternative and factual responses which challenge thought distortions. Used motivational interviewing to assist and encourage patient through the change process. Explored patients barriers to change.   Plan: Return again in one  weeks.  Diagnosis: Axis I: Adjustment Disorder with Mixed Emotional Features    Axis II: No diagnosis    Brinden Kincheloe, LCSW 10/19/2012

## 2012-11-04 ENCOUNTER — Ambulatory Visit (INDEPENDENT_AMBULATORY_CARE_PROVIDER_SITE_OTHER): Payer: 59 | Admitting: Licensed Clinical Social Worker

## 2012-11-04 DIAGNOSIS — F4323 Adjustment disorder with mixed anxiety and depressed mood: Secondary | ICD-10-CM

## 2012-11-04 NOTE — Progress Notes (Signed)
   THERAPIST PROGRESS NOTE  Session Time: 8:30am-9:20am  Participation Level: Active  Behavioral Response: Well GroomedAlertAnxious  Type of Therapy: Individual Therapy  Treatment Goals addressed: Coping  Interventions: CBT, Strength-based, Supportive and Reframing  Summary: Sandra Clay is a 57 y.o. female who presents with anxious mood and affect. She reports an increase in anxiety related to parenting her daughter and endorses strong negative self talk about being incompetent as a mother. She processes her ongoing frustration about the right choices to make and how to set and uphold firm and clear limits with her daughter. She questions if the limits she has set are clear and firm enough. She questions if she is sending her daughter any wrong messages. She is taking good care of herself and her relationship with her fiancee is going well. She processes some anxiety related to this and to his follow through on commitment. Her sleep and appetite are wnl.    Suicidal/Homicidal: Nowithout intent/plan  Therapist Response: Assessed patients current functioning and reviewed progress. Reviewed coping strategies. Assessed patients safety and assisted in identifying protective factors.  Reviewed crisis plan with patient. Assisted patient with the expression of anxiety. Reviewed patients self care plan. Assessed progress related to self care. Patients self care is good. Recommend daily exercise, increased socialization and recreation. Reviewed healthy boundaries and assertive communication. Used CBT to assist patient with the identification of negative distortions and irrational thoughts. Encouraged patient to verbalize alternative and factual responses which challenge thought distortions. Used motivational interviewing to assist and encourage patient through the change process. Explored patients barriers to change.   Plan: Return again in two weeks.  Diagnosis: Axis I: Adjustment Disorder with Mixed  Emotional Features    Axis II: No diagnosis    Esmay Amspacher, LCSW 11/04/2012

## 2012-11-18 ENCOUNTER — Ambulatory Visit (INDEPENDENT_AMBULATORY_CARE_PROVIDER_SITE_OTHER): Payer: 59 | Admitting: Licensed Clinical Social Worker

## 2012-11-18 DIAGNOSIS — F4323 Adjustment disorder with mixed anxiety and depressed mood: Secondary | ICD-10-CM

## 2012-11-18 NOTE — Progress Notes (Signed)
   THERAPIST PROGRESS NOTE  Session Time: 8:30am-9:20am  Participation Level: Active  Behavioral Response: Well GroomedAlertAnxious and Euthymic  Type of Therapy: Individual Therapy  Treatment Goals addressed: Coping  Interventions: CBT, DBT, Strength-based, Supportive and Reframing  Summary: Sandra Clay is a 57 y.o. female who presents with euthymic mood and anxious affect. She reports ongoing anxiety and stress related to her daughter. She processes her frustration with her daughters lack of motivation and willingness to accept so little for herself. Patient continues to work on developing tolerance for her own anxiety related to her daughters condition. She is pleased that her fiancee is making progress related to moving in and she trusts his motivation and momentum. She endorses some work related stress but is able to manage this. She processes her grief related to her daughters high school experience and her hopes. Her sleep and appetite are wnl.    Suicidal/Homicidal: Nowithout intent/plan  Therapist Response: Assessed patients current functioning and reviewed progress. Reviewed coping strategies. Assessed patients safety and assisted in identifying protective factors.  Reviewed crisis plan with patient. Assisted patient with the expression of anxiety. Reviewed patients self care plan. Assessed progress related to self care. Patients self care is good. Recommend daily exercise, increased socialization and recreation. Used CBT to assist patient with the identification of negative distortions and irrational thoughts. Encouraged patient to verbalize alternative and factual responses which challenge thought distortions. Used DBT to practice mindfulness, review distraction list and improve distress tolerance skills. Reviewed healthy boundaries and assertive communication. Used motivational interviewing to assist and encourage patient through the change process. Explored patients barriers to  change.   Plan: Return again in two weeks.  Diagnosis: Axis I: Adjustment Disorder with Mixed Emotional Features    Axis II: No diagnosis    Seona Clemenson, LCSW 11/18/2012

## 2012-12-02 ENCOUNTER — Ambulatory Visit (INDEPENDENT_AMBULATORY_CARE_PROVIDER_SITE_OTHER): Payer: 59 | Admitting: Licensed Clinical Social Worker

## 2012-12-02 DIAGNOSIS — F4323 Adjustment disorder with mixed anxiety and depressed mood: Secondary | ICD-10-CM

## 2012-12-02 NOTE — Progress Notes (Signed)
   THERAPIST PROGRESS NOTE  Session Time: 8:30am-9:20am  Participation Level: Active  Behavioral Response: Well GroomedAlertAnxious and Euthymic  Type of Therapy: Individual Therapy  Treatment Goals addressed: Coping  Interventions: CBT, Strength-based, Supportive and Reframing  Summary: Sandra Clay is a 57 y.o. female who presents with euthymic mood and bright affect. She reports that she is doing well with some work related stress and anxiety. She processes her ongoing frustration with her daughter, but is pleased that she is attending school regularly without any argument. She notices decreased irritability in her daughter since she discontinued all her medication. Patient endorses difficulty dealing with her daughters lack of involvement in the world and her limited social interactions. She wants desperately for her daughter to demonstrate improved motivation, but patient is developing improved distress tolerance. Her sleep and appetite are wnl.    Suicidal/Homicidal: Nowithout intent/plan  Therapist Response: Assessed patients current functioning and reviewed progress. Reviewed coping strategies. Assessed patients safety and assisted in identifying protective factors.  Reviewed crisis plan with patient. Assisted patient with the expression of anxiety. Reviewed patients self care plan. Assessed progress related to self care. Patients self care is very good. Recommend daily exercise, increased socialization and recreation. Used CBT to assist patient with the identification of negative distortions and irrational thoughts. Encouraged patient to verbalize alternative and factual responses which challenge thought distortions. Reviewed healthy boundaries and assertive communication.   Plan: Return again in two weeks.  Diagnosis: Axis I: Adjustment Disorder with Mixed Emotional Features    Axis II: No diagnosis    Arvid Marengo, LCSW 12/02/2012

## 2012-12-16 ENCOUNTER — Ambulatory Visit (INDEPENDENT_AMBULATORY_CARE_PROVIDER_SITE_OTHER): Payer: 59 | Admitting: Licensed Clinical Social Worker

## 2012-12-16 DIAGNOSIS — F4323 Adjustment disorder with mixed anxiety and depressed mood: Secondary | ICD-10-CM

## 2012-12-16 NOTE — Progress Notes (Signed)
   THERAPIST PROGRESS NOTE  Session Time: 8:30am-9:20am  Participation Level: Active  Behavioral Response: Well GroomedAlertAnxious and Euthymic  Type of Therapy: Individual Therapy  Treatment Goals addressed: Coping  Interventions: CBT, Strength-based, Supportive and Reframing  Summary: Sandra Clay is a 57 y.o. female who presents with euthymic mood and bright affect. She reports that she is doing well, with continued, but well managed anxiety related to her daughter and her job. She processes her surprise that her daughters behavior and mood have dramatically improved since she stopped all her medication. She is concerned that her daughter continues to smoke marijuana and has communicated that to her. She processes her grief and sadness over the loss of milestones that both her and her daughter would have experienced while she is in [employer]. She is taking very good care of herself. Her sleep and appetite are wnl.    Suicidal/Homicidal: Nowithout intent/plan  Therapist Response: Assessed patients current functioning and reviewed progress. Reviewed coping strategies. Assessed patients safety and assisted in identifying protective factors.  Reviewed crisis plan with patient. Assisted patient with the expression of anxeity. Reviewed patients self care plan. Assessed progress related to self care. Patients self care is good. Recommend daily exercise, increased socialization and recreation. Used CBT to assist patient with the identification of negative distortions and irrational thoughts. Encouraged patient to verbalize alternative and factual responses which challenge thought distortions. Reviewed healthy boundaries and assertive communication.   Plan: Return again in two weeks.  Diagnosis: Axis I: Adjustment Disorder with Mixed Emotional Features    Axis II: No diagnosis    Sandra Fabela, LCSW 12/16/2012

## 2012-12-31 ENCOUNTER — Ambulatory Visit (INDEPENDENT_AMBULATORY_CARE_PROVIDER_SITE_OTHER): Payer: 59 | Admitting: Licensed Clinical Social Worker

## 2012-12-31 ENCOUNTER — Encounter (INDEPENDENT_AMBULATORY_CARE_PROVIDER_SITE_OTHER): Payer: Self-pay

## 2012-12-31 DIAGNOSIS — F4323 Adjustment disorder with mixed anxiety and depressed mood: Secondary | ICD-10-CM

## 2012-12-31 NOTE — Progress Notes (Signed)
   THERAPIST PROGRESS NOTE  Session Time: 9:30am-10:20am  Participation Level: Active  Behavioral Response: Well GroomedAlertAnxious  Type of Therapy: Individual Therapy  Treatment Goals addressed: Coping  Interventions: CBT, Motivational Interviewing, Strength-based, Supportive and Reframing  Summary: Sandra Clay is a 57 y.o. female who presents with euthymic mood and anxious affect. She reports increased anxiety about her daughter and reports that her daughter has come home the last three nights under the influence of either drugs or alcohol or both. She is angry that her daughter is driving under the influence and she knows she must set firmer limits. She reports realizing that her life would be better if her daughter did not live with her so she didn't have to watch her destroy herself. She struggles with distorted thinking, self doubt about her decisions as a mother and fear that her daughter will die. She is able to formulate a plan where in she will verbally set firmer boundaries with daughter, which will include taking the car away and eventually talking to her about moving out if she does not stop using drugs and alcohol. Her sleep has been poor lately and her appetite is wnl.  Suicidal/Homicidal: Nowithout intent/plan  Therapist Response: Assessed patients current functioning and reviewed progress. Reviewed coping strategies. Assessed patients safety and assisted in identifying protective factors.  Reviewed crisis plan with patient. Assisted patient with the expression of anxiety. Reviewed patients self care plan. Assessed progress related to self care. Patients self care is good. Recommend daily exercise, increased socialization and recreation. Used CBT to assist patient with the identification of negative distortions and irrational thoughts. Encouraged patient to verbalize alternative and factual responses which challenge thought distortions. Reviewed healthy boundaries and assertive  communication. Used motivational interviewing to assist and encourage patient through the change process. Explored patients barriers to change.   Plan: Return again in two weeks.  Diagnosis: Axis I: Adjustment Disorder with Mixed Emotional Features    Axis II: No diagnosis    Jullian Previti, LCSW 12/31/2012

## 2013-01-13 ENCOUNTER — Ambulatory Visit (INDEPENDENT_AMBULATORY_CARE_PROVIDER_SITE_OTHER): Payer: 59 | Admitting: Licensed Clinical Social Worker

## 2013-01-13 DIAGNOSIS — F4323 Adjustment disorder with mixed anxiety and depressed mood: Secondary | ICD-10-CM

## 2013-01-13 NOTE — Progress Notes (Signed)
   THERAPIST PROGRESS NOTE  Session Time: 8:30am-9:20am  Participation Level: Active  Behavioral Response: Well GroomedAlertAnxious and Irritable  Type of Therapy: Individual Therapy  Treatment Goals addressed: Coping  Interventions: CBT, Strength-based, Supportive and Reframing  Summary: Sandra Clay is a 57 y.o. female who presents with irritable mood and frustrated affect. She is upset over billing issues related to her appointments here. She says she was prepared to come in today and cancel all her future appointments because of the billing problems. She processes her anxiety related to financial issues, that she feels out of control and that she feels threatened. She questions herself and immediately blames herself for any financial problems which may not be her responsibility. She is hard on herself but responds well to reframing and redirection. She feels she is making progress with her daughter by forcing her daughter to make her own choices, rather than seek approval from patient for things which she disagrees with. Her sleep and appetite are wnl.   Suicidal/Homicidal: Nowithout intent/plan  Therapist Response: Assessed patients current functioning and reviewed progress. Reviewed coping strategies. Assessed patients safety and assisted in identifying protective factors.  Reviewed crisis plan with patient. Assisted patient with the expression of frustration and anger. Reviewed patients self care plan. Assessed progress related to self care. Patients self care is good. Recommend daily exercise, increased socialization and recreation. Reviewed healthy boundaries and assertive communication. Used CBT to assist patient with the identification of negative distortions and irrational thoughts. Encouraged patient to verbalize alternative and factual responses which challenge thought distortions. Used motivational interviewing to assist and encourage patient through the change process. Explored  patients barriers to change.   Plan: Return again in two weeks.  Diagnosis: Axis I: Adjustment Disorder with Mixed Emotional Features    Axis II: No diagnosis    Nils Thor, LCSW 01/13/2013

## 2013-01-27 ENCOUNTER — Ambulatory Visit (INDEPENDENT_AMBULATORY_CARE_PROVIDER_SITE_OTHER): Payer: 59 | Admitting: Licensed Clinical Social Worker

## 2013-01-27 DIAGNOSIS — F4323 Adjustment disorder with mixed anxiety and depressed mood: Secondary | ICD-10-CM

## 2013-01-27 NOTE — Progress Notes (Signed)
   THERAPIST PROGRESS NOTE  Session Time: 8:30am-9:20am  Participation Level: Active  Behavioral Response: Well GroomedAlertEuthymic  Type of Therapy: Individual Therapy  Treatment Goals addressed: Coping  Interventions: CBT, Strength-based, Supportive and Reframing  Summary: Sandra Clay is a 57 y.o. female who presents with euthymic mood and bright affect. She reports that she is doing well, managing her anxiety well and feels that she has made significant progress in therapy. She remains frustrated with her daughter's life choices, but she is better able to manage her expectations. She processes her frustration with her boyfriend and how he expects her to wait for him. She explores her disappointment and action she may take regarding this relationship. Her sleep and appetite are wnl.   Suicidal/Homicidal: Nowithout intent/plan  Therapist Response: Assessed patients current functioning and reviewed progress. Reviewed coping strategies. Assessed patients safety and assisted in identifying protective factors.  Reviewed crisis plan with patient. Assisted patient with the expression of anxiety and frustration. Reviewed patients self care plan. Assessed progress related to self care. Patients self care is good. Recommend daily exercise, increased socialization and recreation. Used CBT to assist patient with the identification of negative distortions and irrational thoughts. Encouraged patient to verbalize alternative and factual responses which challenge thought distortions. Reviewed healthy boundaries and assertive communication.   Plan: Return again in four weeks.  Diagnosis: Axis I: Adjustment Disorder with Mixed Emotional Features    Axis II: No diagnosis    Jolane Bankhead, LCSW 01/27/2013

## 2013-02-10 ENCOUNTER — Ambulatory Visit (HOSPITAL_COMMUNITY): Payer: Self-pay | Admitting: Licensed Clinical Social Worker

## 2013-02-24 ENCOUNTER — Ambulatory Visit (HOSPITAL_COMMUNITY): Payer: Self-pay | Admitting: Licensed Clinical Social Worker

## 2013-03-09 ENCOUNTER — Ambulatory Visit (HOSPITAL_COMMUNITY): Payer: Self-pay | Admitting: Licensed Clinical Social Worker

## 2013-03-24 ENCOUNTER — Ambulatory Visit (HOSPITAL_COMMUNITY): Payer: Self-pay | Admitting: Licensed Clinical Social Worker

## 2013-04-22 ENCOUNTER — Ambulatory Visit (INDEPENDENT_AMBULATORY_CARE_PROVIDER_SITE_OTHER): Payer: 59 | Admitting: Licensed Clinical Social Worker

## 2013-04-22 DIAGNOSIS — F4323 Adjustment disorder with mixed anxiety and depressed mood: Secondary | ICD-10-CM

## 2013-04-22 NOTE — Progress Notes (Signed)
   THERAPIST PROGRESS NOTE  Session Time: 4:00pm-4:50pm  Participation Level: Active  Behavioral Response: Well GroomedAlertAnxious  Type of Therapy: Individual Therapy  Treatment Goals addressed: Coping  Interventions: CBT, Strength-based, Supportive and Reframing  Summary: ESHAAL DUBY is a 57 y.o. female who presents with anxious mood and affect. Patient reports an increase in feelings of anger towards her children, her fiancee and her job. She is angry that her life is negatively affected by others poor choices. She processes her feelings. Will discuss further next week and then discuss a referral.    Suicidal/Homicidal: Nowithout intent/plan  Therapist Response: Assessed patients current functioning and reviewed progress. Reviewed coping strategies. Assessed patients safety and assisted in identifying protective factors.  Reviewed crisis plan with patient. Assisted patient with the expression of frustration and anger. Reviewed patients self care plan. Assessed progress related to self care. Patients self care is good. Recommend daily exercise, increased socialization and recreation. Used CBT to assist patient with the identification of negative distortions and irrational thoughts. Encouraged patient to verbalize alternative and factual responses which challenge thought distortions.   Plan: Return again in one weeks.  Diagnosis: Axis I: Adjustment Disorder with Mixed Emotional Features    Axis II: No diagnosis    Khyle Goodell, LCSW 04/22/2013

## 2013-04-27 ENCOUNTER — Ambulatory Visit (INDEPENDENT_AMBULATORY_CARE_PROVIDER_SITE_OTHER): Payer: 59 | Admitting: Licensed Clinical Social Worker

## 2013-04-27 DIAGNOSIS — F4323 Adjustment disorder with mixed anxiety and depressed mood: Secondary | ICD-10-CM

## 2013-04-27 NOTE — Progress Notes (Signed)
   THERAPIST PROGRESS NOTE  Session Time: 4:00pm-4:50pm  Participation Level: Active  Behavioral Response: Well GroomedAlertEuthymic  Type of Therapy: Individual Therapy  Treatment Goals addressed: Coping  Interventions: CBT, Strength-based, Supportive and Reframing  Summary: Sandra Clay is a 58 y.o. female who presents with euthymic mood and bright affect. Patient processes her ongoing frustration with her daughter. She reports feelings of resentment which she is uncomfortable with. She continues to struggle with tolerating her daughters pain, but has made significant progress in this area. Discussed her treatment, progress and strategies to use to manage her anxiety.    Suicidal/Homicidal: Nowithout intent/plan  Therapist Response: Assessed patients current functioning and reviewed progress. Reviewed coping strategies. Assessed patients safety and assisted in identifying protective factors.  Reviewed crisis plan with patient. Assisted patient with the expression of frustration . Reviewed patients self care plan. Assessed progress related to self care. Patients self care is good. Recommend daily exercise, increased socialization and recreation. Used CBT to assist patient with the identification of negative distortions and irrational thoughts. Encouraged patient to verbalize alternative and factual responses which challenge thought distortions. Reviewed healthy boundaries and assertive communication.   Plan Patient plans to take a break from therapy at this time and plans to follow up with [facility] if she wants to return to treatment. .  Diagnosis: Axis I: Adjustment Disorder with Mixed Emotional Features    Axis II: No diagnosis    Esma Kilts, LCSW 04/27/2013

## 2013-05-06 ENCOUNTER — Ambulatory Visit (HOSPITAL_COMMUNITY): Payer: Self-pay | Admitting: Licensed Clinical Social Worker

## 2013-05-20 ENCOUNTER — Ambulatory Visit (HOSPITAL_COMMUNITY): Payer: Self-pay | Admitting: Licensed Clinical Social Worker

## 2013-06-03 ENCOUNTER — Ambulatory Visit (HOSPITAL_COMMUNITY): Payer: Self-pay | Admitting: Licensed Clinical Social Worker

## 2013-10-29 ENCOUNTER — Other Ambulatory Visit: Payer: Self-pay

## 2013-10-29 DIAGNOSIS — Z1231 Encounter for screening mammogram for malignant neoplasm of breast: Secondary | ICD-10-CM

## 2013-11-09 ENCOUNTER — Encounter (INDEPENDENT_AMBULATORY_CARE_PROVIDER_SITE_OTHER): Payer: Self-pay

## 2013-11-09 ENCOUNTER — Ambulatory Visit
Admission: RE | Admit: 2013-11-09 | Discharge: 2013-11-09 | Disposition: A | Discharge status: Home / Self Care | Payer: Self-pay | Source: Ambulatory Visit

## 2013-11-09 DIAGNOSIS — Z1231 Encounter for screening mammogram for malignant neoplasm of breast: Secondary | ICD-10-CM

## 2014-12-02 ENCOUNTER — Other Ambulatory Visit: Payer: Self-pay

## 2014-12-02 DIAGNOSIS — Z1231 Encounter for screening mammogram for malignant neoplasm of breast: Secondary | ICD-10-CM

## 2014-12-30 ENCOUNTER — Ambulatory Visit
Admission: RE | Admit: 2014-12-30 | Discharge: 2014-12-30 | Disposition: A | Discharge status: Home / Self Care | Payer: 59 | Source: Ambulatory Visit

## 2014-12-30 DIAGNOSIS — Z1231 Encounter for screening mammogram for malignant neoplasm of breast: Secondary | ICD-10-CM

## 2015-10-05 DIAGNOSIS — R87610 Atypical squamous cells of undetermined significance on cytologic smear of cervix (ASC-US): Secondary | ICD-10-CM | POA: Diagnosis not present

## 2015-10-05 DIAGNOSIS — Z01419 Encounter for gynecological examination (general) (routine) without abnormal findings: Secondary | ICD-10-CM | POA: Diagnosis not present

## 2015-10-05 DIAGNOSIS — Z6826 Body mass index (BMI) 26.0-26.9, adult: Secondary | ICD-10-CM | POA: Diagnosis not present

## 2015-10-20 DIAGNOSIS — Z8249 Family history of ischemic heart disease and other diseases of the circulatory system: Secondary | ICD-10-CM | POA: Diagnosis not present

## 2015-10-20 DIAGNOSIS — Z1321 Encounter for screening for nutritional disorder: Secondary | ICD-10-CM | POA: Diagnosis not present

## 2015-10-20 DIAGNOSIS — E559 Vitamin D deficiency, unspecified: Secondary | ICD-10-CM | POA: Diagnosis not present

## 2015-10-20 DIAGNOSIS — Z833 Family history of diabetes mellitus: Secondary | ICD-10-CM | POA: Diagnosis not present

## 2015-10-20 DIAGNOSIS — E785 Hyperlipidemia, unspecified: Secondary | ICD-10-CM | POA: Diagnosis not present

## 2015-12-25 ENCOUNTER — Other Ambulatory Visit: Payer: Self-pay | Admitting: Obstetrics & Gynecology

## 2015-12-25 DIAGNOSIS — Z1231 Encounter for screening mammogram for malignant neoplasm of breast: Secondary | ICD-10-CM

## 2015-12-29 DIAGNOSIS — E559 Vitamin D deficiency, unspecified: Secondary | ICD-10-CM | POA: Diagnosis not present

## 2016-01-05 ENCOUNTER — Ambulatory Visit
Admission: RE | Admit: 2016-01-05 | Discharge: 2016-01-05 | Disposition: A | Discharge status: Home / Self Care | Payer: 59 | Source: Ambulatory Visit | Attending: Obstetrics & Gynecology | Admitting: Obstetrics & Gynecology

## 2016-01-05 DIAGNOSIS — Z1231 Encounter for screening mammogram for malignant neoplasm of breast: Secondary | ICD-10-CM

## 2016-03-17 DIAGNOSIS — Z0100 Encounter for examination of eyes and vision without abnormal findings: Secondary | ICD-10-CM | POA: Diagnosis not present

## 2016-03-18 HISTORY — PX: ROTATOR CUFF REPAIR: SHX139

## 2016-04-29 MED FILL — valACYclovir HCL 1 GM TABS: 1 | 30 days supply | Qty: 30 | Fill #0

## 2016-05-28 MED FILL — valACYclovir HCL 1 GM TABS: 1 | 30 days supply | Qty: 30 | Fill #1

## 2016-06-28 MED FILL — valACYclovir HCL 1 GM TABS: 1 | 30 days supply | Qty: 30 | Fill #2

## 2016-06-29 DIAGNOSIS — Z760 Encounter for issue of repeat prescription: Secondary | ICD-10-CM | POA: Diagnosis not present

## 2016-06-29 DIAGNOSIS — B001 Herpesviral vesicular dermatitis: Secondary | ICD-10-CM | POA: Diagnosis not present

## 2016-07-05 DIAGNOSIS — I788 Other diseases of capillaries: Secondary | ICD-10-CM | POA: Diagnosis not present

## 2016-07-12 DIAGNOSIS — M1612 Unilateral primary osteoarthritis, left hip: Secondary | ICD-10-CM | POA: Diagnosis not present

## 2016-07-31 MED FILL — valACYclovir HCL 1 GM TABS: 1 | 30 days supply | Qty: 30 | Fill #3

## 2016-08-20 ENCOUNTER — Encounter (HOSPITAL_COMMUNITY): Payer: Self-pay | Admitting: Emergency Medicine

## 2016-08-20 ENCOUNTER — Ambulatory Visit (HOSPITAL_COMMUNITY)
Admission: EM | Admit: 2016-08-20 | Discharge: 2016-08-20 | Disposition: A | Discharge status: Home / Self Care | Payer: 59 | Attending: Internal Medicine | Admitting: Internal Medicine

## 2016-08-20 DIAGNOSIS — R238 Other skin changes: Secondary | ICD-10-CM

## 2016-08-20 NOTE — ED Provider Notes (Signed)
\]  CSN: [id number]     Arrival date & time 08/20/16  1010 History   None    Chief Complaint  Patient presents with  . Foot Pain   (Consider location/radiation/quality/duration/timing/severity/associated sxs/prior Treatment) 61 year old female underwent sclerotherapy to bilateral legs approximate one week ago. She states that she was 18 well until a couple days ago when she spotted some redness and swelling around the ankles, left greater than the right. There was also tenderness. There is minor, light erythema surrounding the injection site on each ankle. On the left ankle there are 2 "blood blisters" at the site of injections one on each side of the posterior ankle. These are well marginated and relatively small. Mildly tender.      Past Medical History:  Diagnosis Date  . Anxiety   . Depression   . [id number](588.5)    Past Surgical History:  Procedure Laterality Date  . FRACTURE SURGERY    . TUBAL LIGATION     Family History  Problem Relation Age of Onset  . Alcohol abuse Mother   . Alcohol abuse Father   . Alcohol abuse Brother   . Depression Sister   . Alcohol abuse Sister    Social History  Substance Use Topics  . Smoking status: Former Research scientist (life sciences)  . Smokeless tobacco: Not on file  . Alcohol use Not on file     Comment: one to two beers per day    OB History    No data available     Review of Systems  Constitutional: Negative.   Respiratory: Negative.   Cardiovascular: Negative.   Gastrointestinal: Negative.   Skin:       As per history of present illness  All other systems reviewed and are negative.   Allergies  Dilaudid [hydromorphone]  Home Medications   Prior to Admission medications   Not on File   Meds Ordered and Administered this Visit  Medications - No data to display  BP 130/79 (BP Location: Right Arm)   Pulse 77   Temp 98.1 F (36.7 C) (Oral)   Resp 18   SpO2 97%  No data found.   Physical Exam  Constitutional: She is oriented  to person, place, and time. She appears well-developed and well-nourished. No distress.  Neck: Neck supple.  Cardiovascular: Normal rate.   Pulmonary/Chest: Effort normal.  Musculoskeletal: Normal range of motion. She exhibits no edema.  tenderness, discoloration or swelling.  Neurological: She is alert and oriented to person, place, and time.  Skin: Skin is warm and dry. Capillary refill takes less than 2 seconds.  No edema to the ankles. No edema to the calves. There are 2 dark areas one on each side of the left ankle there are roughly annular and are vesicles containing dark material likely blood. Well marginated. No surrounding erythema. No evidence of cellulitis, no lymphangitis. There is a smaller similar lesion to the medial ankle of the right leg. I can well marginated and no sign of cellulitis. Minor surrounding tenderness. This is likely secondary to the puncture wound and extravasation of the saline that was used for the procedure.  Psychiatric: She has a normal mood and affect.  Nursing note and vitals reviewed.   Urgent Care Course     Procedures (including critical care time)  Labs Review Labs Reviewed - No data to display  Imaging Review No results found.   Visual Acuity Review  Right Eye Distance:   Left Eye Distance:   Bilateral Distance:  Right Eye Near:   Left Eye Near:    Bilateral Near:         MDM   1. Vesicles    Post sclerotherapy with normal saline Reassurance. Right now there does not appear to be a sign of cellulitis. It looks more like the injection site has caused some bruising and a blood blister. Do not pop these. Just let them run their course. If you see increased redness or swelling or swelling of the calf muscle and pain in the calf seek medical attention promptly.     Janne Napoleon, NP 08/20/16 1216

## 2016-08-20 NOTE — ED Triage Notes (Signed)
The patient presented to the [facility] with a complaint of pain to her left foot secondary to sclerotherapy that took place last week.

## 2016-08-20 NOTE — Discharge Instructions (Signed)
Right now there does not appear to be a sign of cellulitis. It looks more like the injection site has caused some bruising and a blood blister. Do not pop these. Just let them run their course. If you see increased redness or swelling or swelling of the calf muscle and pain in the calf seek medical attention promptly.

## 2016-08-30 MED FILL — valACYclovir HCL 1 GM TABS: 1 | 30 days supply | Qty: 30 | Fill #4

## 2016-09-06 DIAGNOSIS — L089 Local infection of the skin and subcutaneous tissue, unspecified: Secondary | ICD-10-CM | POA: Diagnosis not present

## 2016-09-06 DIAGNOSIS — L905 Scar conditions and fibrosis of skin: Secondary | ICD-10-CM | POA: Diagnosis not present

## 2016-09-06 MED FILL — SULFAMETHOXAZOLE/TMP DS TAB: 800-160 | 10 days supply | Qty: 20 | Fill #0

## 2016-10-02 MED FILL — MUPIROCIN 2% OINTMENT: 2 | 10 days supply | Qty: 22 | Fill #0

## 2016-10-03 MED FILL — valACYclovir HCL 1 GM TABS: 1 | 30 days supply | Qty: 30 | Fill #5

## 2016-10-07 DIAGNOSIS — J019 Acute sinusitis, unspecified: Secondary | ICD-10-CM | POA: Diagnosis not present

## 2016-10-07 MED FILL — predniSONE 10 MG (21) TBPK: 10 | 6 days supply | Qty: 21 | Fill #0

## 2016-10-07 MED FILL — BROMIPHENIR-PSEUDOEPHED-DM: 30-2-10 | 5 days supply | Qty: 302 | Fill #0

## 2016-10-07 MED FILL — AMOXICILLIN 875 MG TABLET: 875 | 10 days supply | Qty: 20 | Fill #0

## 2016-11-01 MED FILL — valACYclovir HCL 1 GM TABS: 1 | 30 days supply | Qty: 30 | Fill #0

## 2016-11-14 DIAGNOSIS — Z01419 Encounter for gynecological examination (general) (routine) without abnormal findings: Secondary | ICD-10-CM | POA: Diagnosis not present

## 2016-11-14 DIAGNOSIS — Z13820 Encounter for screening for osteoporosis: Secondary | ICD-10-CM | POA: Diagnosis not present

## 2016-11-14 DIAGNOSIS — Z6826 Body mass index (BMI) 26.0-26.9, adult: Secondary | ICD-10-CM | POA: Diagnosis not present

## 2016-12-03 MED FILL — ESTRADIOL 0.1 MG/GM CRM: 0.1 | 30 days supply | Qty: 43 | Fill #0

## 2016-12-04 MED FILL — valACYclovir HCL 1 GM TABS: 1 | 30 days supply | Qty: 30 | Fill #0

## 2017-01-01 ENCOUNTER — Other Ambulatory Visit: Payer: Self-pay | Admitting: Obstetrics & Gynecology

## 2017-01-01 DIAGNOSIS — Z1231 Encounter for screening mammogram for malignant neoplasm of breast: Secondary | ICD-10-CM

## 2017-01-01 MED FILL — valACYclovir HCL 1 GM TABS: 1 | 30 days supply | Qty: 30 | Fill #1

## 2017-01-22 ENCOUNTER — Ambulatory Visit
Admission: RE | Admit: 2017-01-22 | Discharge: 2017-01-22 | Disposition: A | Discharge status: Home / Self Care | Payer: 59 | Source: Ambulatory Visit | Attending: Obstetrics & Gynecology | Admitting: Obstetrics & Gynecology

## 2017-01-22 DIAGNOSIS — Z1231 Encounter for screening mammogram for malignant neoplasm of breast: Secondary | ICD-10-CM

## 2017-01-23 DIAGNOSIS — M1612 Unilateral primary osteoarthritis, left hip: Secondary | ICD-10-CM | POA: Diagnosis not present

## 2017-01-31 ENCOUNTER — Emergency Department (HOSPITAL_COMMUNITY): Payer: 59

## 2017-01-31 ENCOUNTER — Emergency Department (HOSPITAL_COMMUNITY)
Admission: EM | Admit: 2017-01-31 | Discharge: 2017-01-31 | Disposition: A | Discharge status: Home / Self Care | Payer: 59 | Attending: Emergency Medicine | Admitting: Emergency Medicine

## 2017-01-31 ENCOUNTER — Other Ambulatory Visit: Payer: Self-pay

## 2017-01-31 ENCOUNTER — Encounter (HOSPITAL_COMMUNITY): Payer: Self-pay | Admitting: Emergency Medicine

## 2017-01-31 DIAGNOSIS — Y9289 Other specified places as the place of occurrence of the external cause: Secondary | ICD-10-CM | POA: Diagnosis not present

## 2017-01-31 DIAGNOSIS — W000XXA Fall on same level due to ice and snow, initial encounter: Secondary | ICD-10-CM | POA: Diagnosis not present

## 2017-01-31 DIAGNOSIS — Z87891 Personal history of nicotine dependence: Secondary | ICD-10-CM | POA: Insufficient documentation

## 2017-01-31 DIAGNOSIS — Y9389 Activity, other specified: Secondary | ICD-10-CM | POA: Insufficient documentation

## 2017-01-31 DIAGNOSIS — M25511 Pain in right shoulder: Secondary | ICD-10-CM | POA: Diagnosis not present

## 2017-01-31 DIAGNOSIS — Y999 Unspecified external cause status: Secondary | ICD-10-CM | POA: Insufficient documentation

## 2017-01-31 DIAGNOSIS — S40011A Contusion of right shoulder, initial encounter: Secondary | ICD-10-CM | POA: Diagnosis not present

## 2017-01-31 MED ORDER — CYCLOBENZAPRINE HCL 10 MG PO TABS: 10.0000 mg | ORAL_TABLET | Freq: Two times a day (BID) | ORAL | 0 refills | Status: DC | PRN

## 2017-01-31 MED ORDER — IBUPROFEN 200 MG PO TABS
600.0000 mg | ORAL_TABLET | Freq: Once | ORAL | Status: AC
  Administered 2017-01-31: 600 mg via ORAL
  Filled 2017-01-31: qty 3

## 2017-01-31 MED ORDER — NAPROXEN 500 MG PO TABS: 500.0000 mg | ORAL_TABLET | Freq: Two times a day (BID) | ORAL | 0 refills | Status: DC

## 2017-01-31 MED FILL — valACYclovir HCL 1 GM TABS: 1 | 30 days supply | Qty: 30 | Fill #2

## 2017-01-31 MED FILL — traMADol HCL 50 MG TABS: 50 | 7 days supply | Qty: 30 | Fill #0

## 2017-01-31 MED FILL — CYCLOBENZAPRINE 10 MG TAB: 10 | 10 days supply | Qty: 20 | Fill #0

## 2017-01-31 MED FILL — NAPROXEN 500 MG TABLET: 500 | 15 days supply | Qty: 30 | Fill #0

## 2017-01-31 NOTE — Discharge Instructions (Signed)
Please read and follow all provided instructions.  You have been seen today for right shoulder pain  Tests performed today include: An x-ray of the affected area - does NOT show any broken bones or dislocations.  Vital signs. See below for your results today.   Home care instructions: -- *PRICE in the first 24-48 hours after injury: Protect (with brace, splint, sling), if given by your provider -take your arm out of the sling at least once per day and perform shoulder range of motion exercises to prevent frozen shoulder.  Rest Ice- Do not apply ice pack directly to your skin, place towel or similar between your skin and ice/ice pack. Apply ice for 20 min, then remove for 40 min while awake Compression- Wear brace, elastic bandage, splint as directed by your provider Elevate affected extremity above the level of your heart when not walking around for the first 24-48 hours   Use Naproxen as directed with food. Take muscle relaxers as needed. This medication can make you drowsy. Do not drive or operate heavy machinery while using this medication.   Follow-up instructions: Please follow-up the provided orthopedic physician (bone specialist) for further care.   Return instructions:  Please return if your toes or feet are numb or tingling, appear gray or blue, or you have severe pain (also elevate the leg and loosen splint or wrap if you were given one) Please return to the Emergency Department if you experience worsening symptoms.  Please return if you have any other emergent concerns. Additional Information:  Your vital signs today were: BP (!) 144/92    Pulse 87    Temp 98.2 F (36.8 C)    Resp 16    SpO2 96%  If your blood pressure (BP) was elevated above 135/85 this visit, please have this repeated by your doctor within one month. ---------------

## 2017-01-31 NOTE — ED Triage Notes (Signed)
Pt report she slipped on some ice on her deck this am. Golden Circle on R shoulder. Pt reports she is unable to move shoulder. Able to move lower arm. No obvious deformity.

## 2017-01-31 NOTE — ED Notes (Signed)
ED Provider at bedside. 

## 2017-01-31 NOTE — ED Provider Notes (Signed)
[facility] DEPT Provider Note   CSN: [id number] Arrival date & time: 01/31/17  0910     History   Chief Complaint Chief Complaint  Patient presents with  . Shoulder Injury    HPI Sandra Clay is a 60 y.o. female who presents the ED today for fall and right shoulder pain.  Patient states that she was walking onto her deck this morning to feed the birds, when she slipped on a patch of ice, falling onto her right shoulder and right hip.  Since the event patient has been having right shoulder pain that is worsened with movement.  She has significant decreased range of motion 2/2 pain.  There is no obvious deformity.  She is still able to move elbow, wrist and hand without difficulty.  She denies any hip pain or decreased range of motion.  She was able to ambulate after the event.  She denies any head trauma or loss of consciousness.  No headache or neck pain.  No nausea or vomiting since the event.  HPI  Past Medical History:  Diagnosis Date  . Anxiety   . Depression   . FYBOFBPZ(025.8)     Patient Active Problem List   Diagnosis Date Noted  . Adjustment disorder with mixed anxiety and depressed mood 05/14/2012    Past Surgical History:  Procedure Laterality Date  . FRACTURE SURGERY    . TUBAL LIGATION      OB History    No data available       Home Medications    Prior to Admission medications   Not on File    Family History Family History  Problem Relation Age of Onset  . Alcohol abuse Mother   . Breast cancer Mother   . Alcohol abuse Father   . Alcohol abuse Brother   . Depression Sister   . Alcohol abuse Sister   . Breast cancer Maternal Aunt     Social History Social History   Tobacco Use  . Smoking status: Former Smoker  Substance Use Topics  . Alcohol use: Not on file    Comment: one to two beers per day   . Drug use: No     Allergies   Dilaudid [hydromorphone] and Sulfa antibiotics   Review of  Systems Review of Systems  Eyes: Negative for visual disturbance.  Cardiovascular: Negative for chest pain.  Gastrointestinal: Negative for nausea and vomiting.  Musculoskeletal: Positive for arthralgias. Negative for gait problem.  Skin: Negative for color change and wound.  Neurological: Negative for dizziness, weakness, light-headedness, numbness and headaches.     Physical Exam Updated Vital Signs BP (!) 144/92   Pulse 87   Temp 98.2 F (36.8 C)   Resp 16   SpO2 96%   Physical Exam  Constitutional: She appears well-developed and well-nourished.  HENT:  Head: Normocephalic and atraumatic.  Right Ear: External ear normal.  Left Ear: External ear normal.  Eyes: Conjunctivae are normal. Right eye exhibits no discharge. Left eye exhibits no discharge. No scleral icterus.  Pulmonary/Chest: Effort normal. No respiratory distress.  Musculoskeletal:       Right hip: Normal.  Cervical Spine: Appearance normal. No obvious bony deformity. No skin swelling, erythema, heat, fluctuance or break of the skin. No TTP over the cervical spinous processes. No paraspinal tenderness. No step-offs. Patient is able to actively rotate their neck 45 degrees left and right voluntarily without pain and flex and extend the neck without pain.  Does show  internal and external rotation intact with arm at side.  Right Shoulder: Appearance normal. No obvious bony deformity. No skin swelling, erythema, heat, fluctuance or break of the skin. No clavicular deformity or TTP. TTP over posterior shoulder and along scapular spine. Patient able to touch contralateral shoulder with hand. She is unable to show abduction, or flexion of the shoulder actively. Intact passively. Not able to keep arm up against gravity at 90 with abduction.  Does show internal and external rotation intact with arm at side.  Right Elbow: Appearance normal. No obvious bony deformity. No skin swelling, erythema, heat, fluctuance or break of the  skin. No TTP over joint. Active flexion, extension, supination and pronation full and intact without pain. Strength able and appropriate for age for flexion and extension.  Radial Pulse 2+. Cap refill <2 seconds. SILT for M/U/R distributions. Compartments soft.   Neurological: She is alert.  Skin: No pallor.  Psychiatric: She has a normal mood and affect.  Nursing note and vitals reviewed.    ED Treatments / Results  Labs (all labs ordered are listed, but only abnormal results are displayed) Labs Reviewed - No data to display  EKG  EKG Interpretation None       Radiology Dg Shoulder Right  Result Date: 01/31/2017 CLINICAL DATA:  Golden Circle due to ice today; pain in right shoulder, unable to move it by herself but tech was able to move it for her; pt states that she has been seeing dr Kristine Linea for right shoulder pain, has been getting injection EXAM: RIGHT SHOULDER - 2+ VIEW COMPARISON:  None. FINDINGS: There is no evidence of acute fracture or dislocation. No acute appearing cortical irregularity or osseous lesion. Probable old Hill-Sachs fracture deformity within the superolateral portion of the humeral head suggesting previous dislocation. No degenerative change appreciated at the glenohumeral joint space. Soft tissues about the right shoulder are unremarkable. IMPRESSION: No acute findings. Probable old Hill-Sachs fracture deformity within the humeral head suggesting previous dislocation. Electronically Signed   By: Franki Cabot M.D.   On: 01/31/2017 09:58    Procedures Procedures (including critical care time)  Medications Ordered in ED Medications  ibuprofen (ADVIL,MOTRIN) tablet 600 mg (600 mg Oral Given 01/31/17 0948)     Initial Impression / Assessment and Plan / ED Course  I have reviewed the triage vital signs and the nursing notes.  Pertinent labs & imaging results that were available during my care of the patient were reviewed by me and considered in my medical decision  making (see chart for details).     Patient with fall and right shoulder pain.  No head trauma or LOC. Patient is not on blood thinners.  Patient X-Ray negative for obvious fracture or dislocation.  There is concern for rotator cuff pathology as the patient is not able to keep her shoulder adducted against gravity when held at 90 degrees.Pain managed in ED. Pt advised to follow up with orthopedics. Patient given shoulder sling while in ED.  I recommended Price therapy.  Patient is to remove her arm from the shoulder sling at least 1 time per day and perform shoulder range of motion exercises in order to prevent frozen shoulder.  Return precautions discussed. Patient will be dc home & is agreeable with above plan.   Final Clinical Impressions(s) / ED Diagnoses   Final diagnoses:  Acute pain of right shoulder    ED Discharge Orders        Ordered    naproxen (NAPROSYN) 500  MG tablet  2 times daily     01/31/17 1147    cyclobenzaprine (FLEXERIL) 10 MG tablet  2 times daily PRN     01/31/17 1147       Lorelle Gibbs 01/31/17 1615    Dorie Rank, MD 02/02/17 [id number]

## 2017-02-03 DIAGNOSIS — S40011D Contusion of right shoulder, subsequent encounter: Secondary | ICD-10-CM | POA: Diagnosis not present

## 2017-02-04 DIAGNOSIS — S46011D Strain of muscle(s) and tendon(s) of the rotator cuff of right shoulder, subsequent encounter: Secondary | ICD-10-CM | POA: Diagnosis not present

## 2017-02-11 DIAGNOSIS — S46011D Strain of muscle(s) and tendon(s) of the rotator cuff of right shoulder, subsequent encounter: Secondary | ICD-10-CM | POA: Diagnosis not present

## 2017-02-25 DIAGNOSIS — S46011A Strain of muscle(s) and tendon(s) of the rotator cuff of right shoulder, initial encounter: Secondary | ICD-10-CM | POA: Diagnosis not present

## 2017-02-25 DIAGNOSIS — I890 Lymphedema, not elsewhere classified: Secondary | ICD-10-CM | POA: Diagnosis not present

## 2017-02-25 DIAGNOSIS — M7521 Bicipital tendinitis, right shoulder: Secondary | ICD-10-CM | POA: Diagnosis not present

## 2017-02-25 DIAGNOSIS — M19011 Primary osteoarthritis, right shoulder: Secondary | ICD-10-CM | POA: Diagnosis not present

## 2017-02-25 DIAGNOSIS — S46091A Other injury of muscle(s) and tendon(s) of the rotator cuff of right shoulder, initial encounter: Secondary | ICD-10-CM | POA: Diagnosis not present

## 2017-02-25 DIAGNOSIS — M7541 Impingement syndrome of right shoulder: Secondary | ICD-10-CM | POA: Diagnosis not present

## 2017-02-25 DIAGNOSIS — M25511 Pain in right shoulder: Secondary | ICD-10-CM | POA: Diagnosis not present

## 2017-02-25 DIAGNOSIS — S46111A Strain of muscle, fascia and tendon of long head of biceps, right arm, initial encounter: Secondary | ICD-10-CM | POA: Diagnosis not present

## 2017-02-25 DIAGNOSIS — M75121 Complete rotator cuff tear or rupture of right shoulder, not specified as traumatic: Secondary | ICD-10-CM | POA: Diagnosis not present

## 2017-02-25 DIAGNOSIS — G8918 Other acute postprocedural pain: Secondary | ICD-10-CM | POA: Diagnosis not present

## 2017-02-25 MED FILL — HYDROmorphone HCL 2 MG TABS: 2 | 1 days supply | Qty: 20 | Fill #0

## 2017-02-25 MED FILL — ONDANSETRON HCL 4 MG TABLET: 4 | 3 days supply | Qty: 15 | Fill #0

## 2017-02-25 MED FILL — NAPROXEN 500 MG TABLET: 500 | 30 days supply | Qty: 60 | Fill #0

## 2017-02-25 MED FILL — traMADol HCL 50 MG TABS: 50 | 6 days supply | Qty: 40 | Fill #0

## 2017-02-25 MED FILL — diazePAM 5 MG TABS: 5 | 7 days supply | Qty: 30 | Fill #0

## 2017-03-07 MED FILL — valACYclovir HCL 1 GM TABS: 1 | 30 days supply | Qty: 30 | Fill #3

## 2017-03-18 DIAGNOSIS — I890 Lymphedema, not elsewhere classified: Secondary | ICD-10-CM | POA: Diagnosis not present

## 2017-03-18 DIAGNOSIS — S46011A Strain of muscle(s) and tendon(s) of the rotator cuff of right shoulder, initial encounter: Secondary | ICD-10-CM | POA: Diagnosis not present

## 2017-03-18 DIAGNOSIS — M25511 Pain in right shoulder: Secondary | ICD-10-CM | POA: Diagnosis not present

## 2017-03-18 DIAGNOSIS — M19011 Primary osteoarthritis, right shoulder: Secondary | ICD-10-CM | POA: Diagnosis not present

## 2017-04-09 MED FILL — valACYclovir HCL 1 GM TABS: 1 | 30 days supply | Qty: 30 | Fill #4

## 2017-04-17 ENCOUNTER — Encounter: Payer: Self-pay | Admitting: Physical Therapy

## 2017-04-17 ENCOUNTER — Other Ambulatory Visit: Payer: Self-pay

## 2017-04-17 ENCOUNTER — Ambulatory Visit: Payer: 59 | Attending: Orthopedic Surgery | Admitting: Physical Therapy

## 2017-04-17 DIAGNOSIS — M25621 Stiffness of right elbow, not elsewhere classified: Secondary | ICD-10-CM | POA: Diagnosis not present

## 2017-04-17 DIAGNOSIS — M25611 Stiffness of right shoulder, not elsewhere classified: Secondary | ICD-10-CM | POA: Insufficient documentation

## 2017-04-17 DIAGNOSIS — M25511 Pain in right shoulder: Secondary | ICD-10-CM | POA: Insufficient documentation

## 2017-04-17 DIAGNOSIS — M6281 Muscle weakness (generalized): Secondary | ICD-10-CM | POA: Insufficient documentation

## 2017-04-17 NOTE — Patient Instructions (Addendum)
Order over the door pulley for arms  Flexion: ROM (Supine)    Position (A) Helper: Hold left arm close to side of trunk. Motion (B) - Lift arm over head in line with trunk, palm turned inward. CAUTION: Do not push into shoulder joint. Do not force movement if painful. Repeat __15_ times. Repeat with other arm. Do _2__ sessions per day. Variation: Contract method: Resist ___ seconds. (see card G.G.-14) Patient is passive.   Copyright  VHI. All rights reserved.   Abduction: ROM (Supine)    Position (A) Helper: Hold left arm at elbow and wrist. Elbow may be bent or straight. Motion (B) -Glide arm out to side. -Do not allow arm to go beyond shoulder level. CAUTION: Do not push into shoulder joint. Repeat _15__ times. Repeat with other arm. Do _2__ sessions per day. Variation: Contract method: Resist __1_ seconds. (see card G.G.-14) Patient is passive.   Copyright  VHI. All rights reserved.  External Rotation: ROM (Sitting)    Position (A) Helper: Hold left arm close to side of trunk, elbow bent to 90. Motion (B) -Bring hand out to side. -Elbow remains in place, wrist straight. CAUTION: Stop at point of tension in muscle or joint. Repeat _15__ times. Repeat with other arm. Do _1__ sessions per day. Variation: Contract method: Resist _1__ seconds. (see card G.G.-14) Patient is passive.  Copyright  VHI. All rights reserved.   PROM: Elbow Flexion / Extension    Grasp left arm at wrist and gently bend elbow as far as possible. Then straighten arm as far as possible. Hold each position __5__ seconds. Repeat __10__ times per set. Do __2__ sets per session. Do _1___ sessions per day.  Copyright  VHI. All rights reserved.   [facility] [address], [place], [place] [address] Phone # [phone] Fax [phone]

## 2017-04-17 NOTE — Therapy (Addendum)
[facility] [address], [place], [place], [address] Phone: [phone]   Fax:  [phone]  Physical Therapy Evaluation  Patient Details  Name: Sandra Clay MRN: [id number] Date of Birth: Nov 19, 1955 Referring Provider: Dr. Jenetta Loges, PA-C   Encounter Date: 04/17/2017  PT End of Session - 04/17/17 2231    Visit Number  1    Date for PT Re-Evaluation  07/10/17    Authorization Type  [facility]    PT Start Time  1615    PT Stop Time  1700    PT Time Calculation (min)  45 min    Activity Tolerance  Patient tolerated treatment well    Behavior During Therapy  [facility] for tasks assessed/performed;Anxious       Past Medical History:  Diagnosis Date  . Anxiety   . Depression   . XNATFTDD(220.2)     Past Surgical History:  Procedure Laterality Date  . FRACTURE SURGERY    . TUBAL LIGATION      There were no vitals filed for this visit.   Subjective Assessment - 04/17/17 1618    Subjective  Patient had surgery 02/25/2017 right shoulder. Patient injured her shoulder when she slid on the deck due to ice.    Patient is accompained by:  -- daughter    Pertinent History  s/p Right shoulder scope, SAD, DCR, RCR, Biceps tenodesis on 02/25/2017    Patient Stated Goals  pain relief, increase of ROM and strength    Currently in Pain?  Yes    Pain Score  6  2/10 low    Pain Location  Shoulder    Pain Orientation  Right    Pain Descriptors / Indicators  Stabbing;Constant    Pain Type  Surgical pain    Pain Onset  More than a month ago    Pain Frequency  Constant    Aggravating Factors   movement, rest    Pain Relieving Factors  ice    Multiple Pain Sites  No         [facility] PT Assessment - 04/17/17 0001      Assessment   Medical Diagnosis  s/p right shoulder scope, SAD, DCR, RCR and biceps tenodesis    Referring Provider  Dr. Jenetta Loges, PA-C    Onset Date/Surgical Date  02/25/17    Hand Dominance  Right    Prior  Therapy  none      Precautions   Precautions  Shoulder    Shoulder Interventions  Shoulder sling/immobilizer    Precaution Comments  follow protocol      Restrictions   Weight Bearing Restrictions  No      Balance Screen   Has the patient fallen in the past 6 months  Yes    How many times?  1 slipped on ice    Has the patient had a decrease in activity level because of a fear of falling?   No    Is the patient reluctant to leave their home because of a fear of falling?   No      Home Environment   Living Environment  Private residence    Available Help at Discharge  Family      Prior Function   Level of Independence  Independent    Vocation  Full time employment    Vocation Requirements  sitting, on computer      Cognition   Overall Cognitive Status  Within Functional Limits for  tasks assessed      Observation/Other Assessments   Focus on Therapeutic Outcomes (FOTO)   81% limitation and goal is 42% limitation      Posture/Postural Control   Posture/Postural Control  Postural limitations    Postural Limitations  Forward head;Rounded Shoulders    Posture Comments  keeps right arm to her side      ROM / Strength   AROM / PROM / Strength  AROM;PROM;Strength      AROM   Overall AROM   Unable to assess      PROM   PROM Assessment Site  Shoulder    Right/Left Shoulder  Right    Right Shoulder Flexion  70 Degrees    Right Shoulder ABduction  50 Degrees    Right Shoulder Internal Rotation  41 Degrees 25 degrees abduction    Right Shoulder External Rotation  -5 Degrees 25 degrees abduction    Right Elbow Flexion  135    Right Elbow Extension  -40      Strength   Overall Strength  Unable to assess    Right Hand Gross Grasp  -- 28#    Left Hand Gross Grasp  -- 50#             Objective measurements completed on examination: See above findings.              PT Education - 04/17/17 2230    Education provided  Yes    Education Details  where to get  over the door pulleys, PROM to right shoulder and elbow with daughter return demonstration    Person(s) Educated  Patient;Child(ren) daughter    Methods  Explanation;Demonstration;Verbal cues;Handout    Comprehension  Verbalized understanding;Returned demonstration       PT Short Term Goals - 04/17/17 2246      PT SHORT TERM GOAL #1   Title  independent with HEP and has shown her daughter to assist her    Time  4    Period  Weeks    Status  New    Target Date  05/15/17      PT SHORT TERM GOAL #2   Title  right shoulder pain is intermittent due to improve mobiltiy of shoulder and soft tissue    Time  4    Period  Weeks    Status  New    Target Date  05/15/17      PT SHORT TERM GOAL #3   Title  able to lay in bed with pillows to support her right shoulder due to reduction in pain and understanding how to position her right arm    Time  4    Period  Weeks    Status  New    Target Date  05/15/17      PT SHORT TERM GOAL #4   Title  right shoulder flexion >/= 110 degrees PROM    Time  4    Period  Weeks    Status  New    Target Date  05/15/17        PT Long Term Goals - 04/17/17 2250      PT LONG TERM GOAL #1   Title  Independent with HEP and understand how to progress herself    Time  12    Period  Weeks    Status  New    Target Date  07/10/17      PT LONG TERM GOAL #2   Title  right shoulder pain decreased >/= 75% with daily activities due to strength >/= 4-/5    Time  12    Period  Weeks    Status  New    Target Date  07/10/17      PT LONG TERM GOAL #3   Title  ability to pluck her eyebrows with right arm due to AROM for flexion and abduction >/= 120 degrees    Time  12    Period  Weeks    Status  New    Target Date  07/10/17      PT LONG TERM GOAL #4   Title  abilty to reach in a cabinet with right hand to get a dish or item out of the refrigertor due to right shoulder AROM for flexion >/= 120 degrees for flexion    Time  12    Period  Weeks    Status   New    Target Date  06/12/17      PT LONG TERM GOAL #5   Title  ability to dress herself without difficulty due to ER >/= 65 degrees AROM    Time  12    Period  Weeks    Status  New    Target Date  07/10/17      Additional Long Term Goals   Additional Long Term Goals  Yes      PT LONG TERM GOAL #6   Title  abilty to type on the computer for work without difficulty due to improved right shoulder and elbow AROM    Time  12    Period  Weeks    Status  New    Target Date  07/10/17             Plan - 04/17/17 2232    Clinical Impression Statement  Patient is a 62 year old female who feel in December on ice when she injured her right shoulder.  Patient is s/p right shoulder scope, SAD, DCR, RCR, and biceps tenodesis on 02/25/2017.  Patient is 7 weeks out from surgery.  Patient has just stopped wearing her sling on the right arm.  Patient reports constant pain 2-6/10.  Patient reports she has not moved her right shoulder in 7 weeks.  Patient strength and AROM was not measured at this time for the right shoulder due to protocol.  Right elbow extension PROM is -40 degrees.  Right shoulder PROM is very limited due to lack of movement at thei time.  Patient is to avoid resisted elevation 8 weeks post op due to right biceps tenodesis.  MD reports all early ROM is within pain tolerance during 6-10 weeks post op due to large Rotator cuff repair.  Patient is very anxious about tearing the rotator cuff again.  Patient is only able to come 1 time per week due to finances. Patient was educated on getting an overhead pulley system to work on her shoulder ROM.  Patient is right handed. Her grip on the right is 28 pounds and left is 50#.  Patient will benefit from skilled therapy to improve right shoulder and elbow ROM and strength while following the protocol and montioring for pain with ROM due to large RTC tear.     History and Personal Factors relevant to plan of care:  s/p right shoulder scope , SAD,  DCR, RCR and biceps tenodesis on 02/25/2017    Clinical Presentation  Evolving    Clinical Presentation due to:  Patient is  not able to use her right arm with any activity     Clinical Decision Making  Low    Rehab Potential  Excellent    Clinical Impairments Affecting Rehab Potential  avoid resistive elevation x 8 weeks post op due to biceps tenodesis; all early ROM is withitn pain tolerance in weeks 6-10 s/p surgery due to large RTC repair    PT Frequency  1x / week    PT Duration  12 weeks    PT Treatment/Interventions  Cryotherapy;Electrical Stimulation;Iontophoresis 89m/ml Dexamethasone;Moist Heat;Ultrasound;Therapeutic exercise;Therapeutic activities;Neuromuscular re-education;Patient/family education;Passive range of motion;Scar mobilization;Manual techniques;Dry needling;Taping    PT Next Visit Plan  soft tissue work to right shoulder; joint mobilization; scapula mobilization; AAROM to right shoulder within pain tolerance; overhead pulleys, table slides, ball on table ABC's, seated scapular depression/retraction;     PT Home Exercise Plan  coming 1 time per week so wants strong HEP each visit     Consulted and Agree with Plan of Care  Patient;Family member/caregiver    Family Member Consulted  daughter       Patient will benefit from skilled therapeutic intervention in order to improve the following deficits and impairments:  Increased fascial restricitons, Pain, Decreased mobility, Decreased scar mobility, Increased muscle spasms, Decreased strength, Decreased range of motion, Decreased activity tolerance, Impaired flexibility  Visit Diagnosis: Acute pain of right shoulder - Plan: PT plan of care cert/re-cert  Stiffness of right shoulder, not elsewhere classified - Plan: PT plan of care cert/re-cert  Muscle weakness (generalized) - Plan: PT plan of care cert/re-cert  Stiffness of right elbow, not elsewhere classified - Plan: PT plan of care cert/re-cert     Problem  List Patient Active Problem List   Diagnosis Date Noted  . Adjustment disorder with mixed anxiety and depressed mood 05/14/2012    CEarlie Counts PT 04/17/17 11:00 PM   [facility] [address] [place] [address]Phone: [phone]  Fax:  [phone] Name: MPAXTON KANAANMRN: [id number] of Birth: 125-Apr-1957PHYSICAL THERAPY DISCHARGE SUMMARY  Visits from Start of Care: 1  Current functional level related to goals / functional outcomes: See above.    Remaining deficits: See above. Patient called on 04/21/2017 and reported she [facility] therapist [facility]  Patient wanted to be discharged from this facility.    Education / Equipment: HEP Plan: Patient agrees to discharge.  Patient goals were not met. Patient is being discharged due to the patient's request.  Thank you for the referral. CEarlie Counts PT 04/21/17 2:33 PM '?????

## 2017-04-19 DIAGNOSIS — S43421A Sprain of right rotator cuff capsule, initial encounter: Secondary | ICD-10-CM | POA: Diagnosis not present

## 2017-04-21 DIAGNOSIS — Z4789 Encounter for other orthopedic aftercare: Secondary | ICD-10-CM | POA: Diagnosis not present

## 2017-04-21 DIAGNOSIS — M25611 Stiffness of right shoulder, not elsewhere classified: Secondary | ICD-10-CM | POA: Diagnosis not present

## 2017-04-21 DIAGNOSIS — M75121 Complete rotator cuff tear or rupture of right shoulder, not specified as traumatic: Secondary | ICD-10-CM | POA: Diagnosis not present

## 2017-04-21 DIAGNOSIS — R531 Weakness: Secondary | ICD-10-CM | POA: Diagnosis not present

## 2017-04-22 ENCOUNTER — Ambulatory Visit: Payer: 59 | Admitting: Physical Therapy

## 2017-04-24 DIAGNOSIS — M25611 Stiffness of right shoulder, not elsewhere classified: Secondary | ICD-10-CM | POA: Diagnosis not present

## 2017-04-24 DIAGNOSIS — R531 Weakness: Secondary | ICD-10-CM | POA: Diagnosis not present

## 2017-04-24 DIAGNOSIS — Z4789 Encounter for other orthopedic aftercare: Secondary | ICD-10-CM | POA: Diagnosis not present

## 2017-04-24 DIAGNOSIS — M75121 Complete rotator cuff tear or rupture of right shoulder, not specified as traumatic: Secondary | ICD-10-CM | POA: Diagnosis not present

## 2017-04-30 ENCOUNTER — Encounter: Payer: 59 | Admitting: Physical Therapy

## 2017-04-30 DIAGNOSIS — S43421A Sprain of right rotator cuff capsule, initial encounter: Secondary | ICD-10-CM | POA: Diagnosis not present

## 2017-05-05 DIAGNOSIS — S43421A Sprain of right rotator cuff capsule, initial encounter: Secondary | ICD-10-CM | POA: Diagnosis not present

## 2017-05-06 ENCOUNTER — Encounter: Payer: 59 | Admitting: Physical Therapy

## 2017-05-07 DIAGNOSIS — Z4789 Encounter for other orthopedic aftercare: Secondary | ICD-10-CM | POA: Diagnosis not present

## 2017-05-07 DIAGNOSIS — S46091D Other injury of muscle(s) and tendon(s) of the rotator cuff of right shoulder, subsequent encounter: Secondary | ICD-10-CM | POA: Diagnosis not present

## 2017-05-07 DIAGNOSIS — M7501 Adhesive capsulitis of right shoulder: Secondary | ICD-10-CM | POA: Diagnosis not present

## 2017-05-07 DIAGNOSIS — S43421A Sprain of right rotator cuff capsule, initial encounter: Secondary | ICD-10-CM | POA: Diagnosis not present

## 2017-05-10 DIAGNOSIS — Z0100 Encounter for examination of eyes and vision without abnormal findings: Secondary | ICD-10-CM | POA: Diagnosis not present

## 2017-05-13 MED FILL — valACYclovir HCL 1 GM TABS: 1 | 30 days supply | Qty: 30 | Fill #5

## 2017-05-14 ENCOUNTER — Encounter: Payer: 59 | Admitting: Physical Therapy

## 2017-05-14 DIAGNOSIS — S43421A Sprain of right rotator cuff capsule, initial encounter: Secondary | ICD-10-CM | POA: Diagnosis not present

## 2017-05-16 DIAGNOSIS — M7501 Adhesive capsulitis of right shoulder: Secondary | ICD-10-CM | POA: Diagnosis not present

## 2017-05-20 DIAGNOSIS — S43421A Sprain of right rotator cuff capsule, initial encounter: Secondary | ICD-10-CM | POA: Diagnosis not present

## 2017-05-21 ENCOUNTER — Encounter: Payer: 59 | Admitting: Physical Therapy

## 2017-05-23 DIAGNOSIS — S43421A Sprain of right rotator cuff capsule, initial encounter: Secondary | ICD-10-CM | POA: Diagnosis not present

## 2017-05-27 DIAGNOSIS — S43421A Sprain of right rotator cuff capsule, initial encounter: Secondary | ICD-10-CM | POA: Diagnosis not present

## 2017-05-29 DIAGNOSIS — S43421A Sprain of right rotator cuff capsule, initial encounter: Secondary | ICD-10-CM | POA: Diagnosis not present

## 2017-06-02 DIAGNOSIS — S43421A Sprain of right rotator cuff capsule, initial encounter: Secondary | ICD-10-CM | POA: Diagnosis not present

## 2017-06-04 DIAGNOSIS — S43421A Sprain of right rotator cuff capsule, initial encounter: Secondary | ICD-10-CM | POA: Diagnosis not present

## 2017-06-09 DIAGNOSIS — S43421A Sprain of right rotator cuff capsule, initial encounter: Secondary | ICD-10-CM | POA: Diagnosis not present

## 2017-06-13 DIAGNOSIS — S43421A Sprain of right rotator cuff capsule, initial encounter: Secondary | ICD-10-CM | POA: Diagnosis not present

## 2017-06-16 DIAGNOSIS — S43421A Sprain of right rotator cuff capsule, initial encounter: Secondary | ICD-10-CM | POA: Diagnosis not present

## 2017-06-18 DIAGNOSIS — S43421A Sprain of right rotator cuff capsule, initial encounter: Secondary | ICD-10-CM | POA: Diagnosis not present

## 2017-06-20 MED FILL — valACYclovir HCL 1 GM TABS: 1 | 30 days supply | Qty: 30 | Fill #6

## 2017-06-23 DIAGNOSIS — S43421A Sprain of right rotator cuff capsule, initial encounter: Secondary | ICD-10-CM | POA: Diagnosis not present

## 2017-06-30 DIAGNOSIS — S43421A Sprain of right rotator cuff capsule, initial encounter: Secondary | ICD-10-CM | POA: Diagnosis not present

## 2017-07-11 DIAGNOSIS — S43421A Sprain of right rotator cuff capsule, initial encounter: Secondary | ICD-10-CM | POA: Diagnosis not present

## 2017-07-14 ENCOUNTER — Ambulatory Visit (INDEPENDENT_AMBULATORY_CARE_PROVIDER_SITE_OTHER): Payer: Self-pay | Admitting: Nurse Practitioner

## 2017-07-14 VITALS — BP 130/82 | HR 86 | Temp 98.8°F | Resp 18 | Wt 176.0 lb

## 2017-07-14 DIAGNOSIS — J019 Acute sinusitis, unspecified: Secondary | ICD-10-CM

## 2017-07-14 MED ORDER — AMOXICILLIN-POT CLAVULANATE 875-125 MG PO TABS: 1.0000 | ORAL_TABLET | Freq: Two times a day (BID) | ORAL | 0 refills | Status: AC

## 2017-07-14 MED ORDER — PSEUDOEPH-BROMPHEN-DM 30-2-10 MG/5ML PO SYRP: 5.0000 mL | ORAL_SOLUTION | Freq: Four times a day (QID) | ORAL | 0 refills | Status: AC | PRN

## 2017-07-14 MED FILL — AMOX TR-K CLV 875-125 MG TA: 875-125 | 10 days supply | Qty: 20 | Fill #0

## 2017-07-14 MED FILL — BROMIPHENIR-PSEUDOEPHED-DM: 30-2-10 | 7 days supply | Qty: 150 | Fill #0

## 2017-07-14 NOTE — Patient Instructions (Signed)

## 2017-07-14 NOTE — Progress Notes (Signed)
Subjective:  Sandra Clay is a 62 y.o. female who presents for evaluation of possible sinusitis.  Symptoms include facial pain, headache described as aching, nasal congestion, no fever, post nasal drip, productive cough with green colored sputum, sinus pressure, sinus pain and sore throat.  Onset of symptoms was 7 days ago, and has been gradually worsening since that time. Patient states cough has become more persistent, worse at night.  Treatment to date:  nasal steroids and rest.  High risk factors for influenza complications:  none.  The following portions of the patient's history were reviewed and updated as appropriate:  allergies, current medications and past medical history.  Constitutional: positive for fatigue, negative for anorexia, chills, malaise and sweats Eyes: negative Ears, nose, mouth, throat, and face: positive for nasal congestion and sore throat, negative for ear drainage and earaches Respiratory: positive for cough and sputum, negative for asthma, chronic bronchitis, pneumonia, stridor and wheezing Cardiovascular: negative Gastrointestinal: negative Neurological: positive for headaches, negative for coordination problems, dizziness, speech problems, vertigo and weakness Allergic/Immunologic: positive for hay fever, negative for anaphylaxis and urticaria Objective:  BP 130/82 (BP Location: Right Arm, Patient Position: Sitting, Cuff Size: Normal)   Pulse 86   Temp 98.8 F (37.1 C) (Oral)   Resp 18   Wt 176 lb (79.8 kg)   SpO2 96%  General appearance: alert, cooperative, fatigued and no distress Head: Normocephalic, without obvious abnormality, atraumatic Eyes: conjunctivae/corneas clear. PERRL, EOM's intact. Fundi benign. Ears: normal TM's and external ear canals both ears Nose: no discharge, turbinates swollen, inflamed, moderate maxillary sinus tenderness bilateral, mild frontal sinus tenderness bilateral Throat: lips, mucosa, and tongue normal; teeth and gums  normal Lungs: clear to auscultation bilaterally Heart: regular rate and rhythm, S1, S2 normal, no murmur, click, rub or gallop Abdomen: soft, non-tender; bowel sounds normal; no masses,  no organomegaly Pulses: 2+ and symmetric Skin: Skin color, texture, turgor normal. No rashes or lesions Lymph nodes: cervical and submandibular nodes normal Neurologic: Grossly normal    Assessment:  Acute Sinusitis    Plan:  Discussed diagnosis and treatment of sinusitis. Educational material distributed and questions answered. Suggested symptomatic OTC remedies. Supportive care with appropriate antipyretics and fluids. Nasal saline spray for congestion. Augmentin per orders. Nasal steroids per orders. Follow up as needed.  Use humidifier at night for cough.  Sleep elevated on 2 pillows.  Increase fluids to help thin mucus.  Patient verbalizes understanding and has no questions at time of discharge. Meds ordered this encounter  Medications  . amoxicillin-clavulanate (AUGMENTIN) 875-125 MG tablet    Sig: Take 1 tablet by mouth 2 (two) times daily for 10 days.    Dispense:  20 tablet    Refill:  0    Order Specific Question:   Supervising Provider    Answer:   Ricard Dillon [[id number]]  . brompheniramine-pseudoephedrine-DM 30-2-10 MG/5ML syrup    Sig: Take 5 mLs by mouth 4 (four) times daily as needed for up to 7 days.    Dispense:  150 mL    Refill:  0    Order Specific Question:   Supervising Provider    Answer:   Ricard Dillon [phone]

## 2017-07-15 DIAGNOSIS — S43421A Sprain of right rotator cuff capsule, initial encounter: Secondary | ICD-10-CM | POA: Diagnosis not present

## 2017-07-17 ENCOUNTER — Telehealth: Payer: Self-pay

## 2017-07-17 NOTE — Telephone Encounter (Signed)
Patient states the cough is better and she feels better.

## 2017-07-18 DIAGNOSIS — M7501 Adhesive capsulitis of right shoulder: Secondary | ICD-10-CM | POA: Diagnosis not present

## 2017-07-18 DIAGNOSIS — S46091D Other injury of muscle(s) and tendon(s) of the rotator cuff of right shoulder, subsequent encounter: Secondary | ICD-10-CM | POA: Diagnosis not present

## 2017-07-29 MED FILL — valACYclovir HCL 1 GM TABS: 1 | 30 days supply | Qty: 30 | Fill #7

## 2017-08-04 DIAGNOSIS — S43421A Sprain of right rotator cuff capsule, initial encounter: Secondary | ICD-10-CM | POA: Diagnosis not present

## 2017-08-18 DIAGNOSIS — S46091D Other injury of muscle(s) and tendon(s) of the rotator cuff of right shoulder, subsequent encounter: Secondary | ICD-10-CM | POA: Diagnosis not present

## 2017-08-18 DIAGNOSIS — M7501 Adhesive capsulitis of right shoulder: Secondary | ICD-10-CM | POA: Diagnosis not present

## 2017-08-27 MED FILL — valACYclovir HCL 1 GM TABS: 1 | 30 days supply | Qty: 30 | Fill #8

## 2017-09-30 MED FILL — valACYclovir HCL 1 GM TABS: 1 | 30 days supply | Qty: 30 | Fill #9

## 2017-10-29 MED FILL — valACYclovir HCL 1 GM TABS: 1 | 30 days supply | Qty: 30 | Fill #10

## 2017-11-26 DIAGNOSIS — Z803 Family history of malignant neoplasm of breast: Secondary | ICD-10-CM | POA: Diagnosis not present

## 2017-11-26 DIAGNOSIS — Z808 Family history of malignant neoplasm of other organs or systems: Secondary | ICD-10-CM | POA: Diagnosis not present

## 2017-11-26 DIAGNOSIS — Z6827 Body mass index (BMI) 27.0-27.9, adult: Secondary | ICD-10-CM | POA: Diagnosis not present

## 2017-11-26 DIAGNOSIS — Z8042 Family history of malignant neoplasm of prostate: Secondary | ICD-10-CM | POA: Diagnosis not present

## 2017-11-26 DIAGNOSIS — Z800 Family history of malignant neoplasm of digestive organs: Secondary | ICD-10-CM | POA: Diagnosis not present

## 2017-11-26 DIAGNOSIS — Z01419 Encounter for gynecological examination (general) (routine) without abnormal findings: Secondary | ICD-10-CM | POA: Diagnosis not present

## 2017-11-28 MED FILL — valACYclovir HCL 1 GM TABS: 1 | 30 days supply | Qty: 30 | Fill #0

## 2017-12-16 DIAGNOSIS — Z803 Family history of malignant neoplasm of breast: Secondary | ICD-10-CM | POA: Diagnosis not present

## 2017-12-16 DIAGNOSIS — Z809 Family history of malignant neoplasm, unspecified: Secondary | ICD-10-CM | POA: Diagnosis not present

## 2017-12-23 DIAGNOSIS — Z13220 Encounter for screening for lipoid disorders: Secondary | ICD-10-CM | POA: Diagnosis not present

## 2017-12-23 DIAGNOSIS — Z130 Encounter for screening for diseases of the blood and blood-forming organs and certain disorders involving the immune mechanism: Secondary | ICD-10-CM | POA: Diagnosis not present

## 2017-12-23 DIAGNOSIS — Z1321 Encounter for screening for nutritional disorder: Secondary | ICD-10-CM | POA: Diagnosis not present

## 2017-12-23 DIAGNOSIS — Z131 Encounter for screening for diabetes mellitus: Secondary | ICD-10-CM | POA: Diagnosis not present

## 2017-12-23 DIAGNOSIS — Z13228 Encounter for screening for other metabolic disorders: Secondary | ICD-10-CM | POA: Diagnosis not present

## 2017-12-30 ENCOUNTER — Encounter: Payer: Self-pay | Admitting: Family Medicine

## 2017-12-30 ENCOUNTER — Ambulatory Visit (INDEPENDENT_AMBULATORY_CARE_PROVIDER_SITE_OTHER): Payer: 59 | Admitting: Family Medicine

## 2017-12-30 VITALS — BP 116/68 | HR 74 | Temp 97.9°F | Ht 66.0 in | Wt 172.6 lb

## 2017-12-30 DIAGNOSIS — A6000 Herpesviral infection of urogenital system, unspecified: Secondary | ICD-10-CM | POA: Diagnosis not present

## 2017-12-30 DIAGNOSIS — R2241 Localized swelling, mass and lump, right lower limb: Secondary | ICD-10-CM | POA: Diagnosis not present

## 2017-12-30 MED FILL — valACYclovir HCL 1 GM TABS: 1 | 30 days supply | Qty: 30 | Fill #1

## 2017-12-30 NOTE — Progress Notes (Signed)
Subjective:  Sandra Clay is a 62 y.o. female who presents today with a chief complaint of skin nodules and to establish care.   HPI:  Skin nodules, new problem Started about a week ago.  Located on the posterior aspect of her right leg.  No history of trauma or other obvious precipitating events.  No specific treatments tried.  No reported Clay.  No discharge.  No redness.  No other obvious alleviating or aggravating factors.  Lesions seem to be getting bigger.  History of genital herpes, chronic problem, new to provider Several year history.  Currently on Valtrex 1000 mg daily.  Has not had an outbreak in the past 5 to 6 years.  ROS: Per HPI, otherwise a complete review of systems was negative.   PMH:  The following were reviewed and entered/updated in epic: Past Medical History:  Diagnosis Date  . Anxiety   . Depression   . YFVCBSWH(675.9)    Patient Active Problem List   Diagnosis Date Noted  . Skin lump of leg, right 12/30/2017  . Genital herpes 12/30/2017   Past Surgical History:  Procedure Laterality Date  . BUNIONECTOMY    . CESAREAN SECTION     x3  . CHOLECYSTECTOMY    . FRACTURE SURGERY    . KNEE ARTHROSCOPY W/ MENISCAL REPAIR    . ROTATOR CUFF REPAIR    . TUBAL LIGATION      Family History  Problem Relation Age of Onset  . Alcohol abuse Mother   . Breast cancer Mother   . Alcohol abuse Father   . Arthritis Father   . Cancer Father   . Diabetes Father   . Hearing loss Father   . Alcohol abuse Brother   . Cancer Brother   . Depression Brother   . Drug abuse Brother   . Early death Brother   . Hearing loss Brother   . Depression Sister   . Alcohol abuse Sister   . Breast cancer Maternal Aunt   . Colon cancer Neg Hx     Medications- reviewed and updated Current Outpatient Medications  Medication Sig Dispense Refill  . Calcium 200 MG TABS Take by mouth.    . cholecalciferol (VITAMIN D) 1000 units tablet Take 1,000 Units by mouth daily.    .  valACYclovir (VALTREX) 1000 MG tablet Take 1 g by mouth daily.  12   No current facility-administered medications for this visit.     Allergies-reviewed and updated Allergies  Allergen Reactions  . Dilaudid [Hydromorphone] Itching  . Sulfa Antibiotics     rash    Social History   Socioeconomic History  . Marital status: Legally Separated    Spouse name: Not on file  . Number of children: Not on file  . Years of education: Not on file  . Highest education level: Not on file  Occupational History  . Not on file  Social Needs  . Financial resource strain: Not on file  . Food insecurity:    Worry: Not on file    Inability: Not on file  . Transportation needs:    Medical: Not on file    Non-medical: Not on file  Tobacco Use  . Smoking status: Former Research scientist (life sciences)  . Smokeless tobacco: Never Used  Substance and Sexual Activity  . Alcohol use: Yes    Alcohol/week: 1.0 - 2.0 standard drinks    Types: 1 - 2 Cans of beer per week    Comment: one to two beers  per day   . Drug use: No  . Sexual activity: Yes    Birth control/protection: Post-menopausal  Lifestyle  . Physical activity:    Days per week: Not on file    Minutes per session: Not on file  . Stress: Not on file  Relationships  . Social connections:    Talks on phone: Not on file    Gets together: Not on file    Attends religious service: Not on file    Active member of club or organization: Not on file    Attends meetings of clubs or organizations: Not on file    Relationship status: Not on file  Other Topics Concern  . Not on file  Social History Narrative  . Not on file     Objective:  Physical Exam: BP 116/68 (BP Location: Left Arm, Patient Position: Sitting, Cuff Size: Normal)   Pulse 74   Temp 97.9 F (36.6 C) (Oral)   Ht 5\' 6"  (1.676 m)   Wt 172 lb 9.6 oz (78.3 kg)   SpO2 98%   BMI 27.86 kg/m   Gen: NAD, resting comfortably CV: RRR with no murmurs appreciated Pulm: NWOB, CTAB with no crackles,  wheezes, or rhonchi GI: Normal bowel sounds present. Soft, Nontender, Nondistended. MSK:  - Right leg: Several small, discrete nodules noted on posterior aspect of right lower leg.  Lesions approximately 4 to 5 mm in diameter.  No fluctuance.   -Lower extremities: Spider veins noted bilaterally as well as mild varicosities bilaterally. Skin: Warm, dry Neuro: Grossly normal, moves all extremities Psych: Normal affect and thought content  Assessment/Plan:  Genital herpes Stable.  Continue suppressive dose of Valtrex thousand milligrams once daily.  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  No red flag signs or symptoms.  Given that lesions have only been present for a week, and are increasing in size, we will obtain ultrasound to rule out other abnormalities.  Sandra Clay. Sandra Pain, MD 12/30/2017 12:29 PM

## 2017-12-30 NOTE — Assessment & Plan Note (Signed)
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  No red flag signs or symptoms.  Given that lesions have only been present for a week, and are increasing in size, we will obtain ultrasound to rule out other abnormalities.

## 2017-12-30 NOTE — Assessment & Plan Note (Signed)
Stable.  Continue suppressive dose of Valtrex thousand milligrams once daily.

## 2017-12-30 NOTE — Patient Instructions (Signed)
It was very nice to see you today!  I think the lump on your leg is most lilkely a superficial clot. These are benign.  We will check an ultrasound to make sure there is nothing else going on.  I would like to get records from your OBGYN.  Come back to see me in 1 year, or sooner as needed.   Take care, Dr [name]

## 2018-01-06 ENCOUNTER — Telehealth: Payer: Self-pay | Admitting: Family Medicine

## 2018-01-06 NOTE — Telephone Encounter (Signed)
Copied from [place] [id number]. Topic: Referral - Status >> Jan 06, 2018  9:36 AM Percell Belt A wrote: Reason for CRM:   Pt called in to check on the status of her [id number] - US SOFT TISSUE LOWER EXTREMITY LIMITED RIGHT (NON-VASCULAR), ultrasound, please advise   Best number  [phone]

## 2018-01-08 ENCOUNTER — Ambulatory Visit
Admission: RE | Admit: 2018-01-08 | Discharge: 2018-01-08 | Disposition: A | Discharge status: Home / Self Care | Payer: 59 | Source: Ambulatory Visit | Attending: Family Medicine | Admitting: Family Medicine

## 2018-01-08 DIAGNOSIS — R2241 Localized swelling, mass and lump, right lower limb: Secondary | ICD-10-CM

## 2018-01-09 ENCOUNTER — Other Ambulatory Visit: Payer: Self-pay

## 2018-01-09 DIAGNOSIS — M7989 Other specified soft tissue disorders: Secondary | ICD-10-CM

## 2018-01-09 NOTE — Telephone Encounter (Signed)
Patient has been given the result of her ultrasound.

## 2018-01-09 NOTE — Progress Notes (Signed)
Please inform patient of the following:  Her ultrasound was inconclusive. The radiologist recommended obtaining an MRI for further evaluation. Please order right lower extremity MRI with and without contrast for soft tissue masses.   [name]. [name], MD 01/09/2018 1:11 PM

## 2018-01-16 ENCOUNTER — Telehealth: Payer: Self-pay

## 2018-01-16 NOTE — Telephone Encounter (Signed)
Copied from [place] [id number]. Topic: General - Inquiry >> Jan 16, 2018  1:33 PM Conception Chancy, NT wrote: Reason for CRM: patient states she received a call last week and states she was supposed to get a xray but the radiologist recommended getting a MRI. She states she has not heard from anyone to schedule.  Looks like it has been approved but not sure what number she needs to call to make app can you help me with this.

## 2018-01-19 NOTE — Telephone Encounter (Signed)
Pt called in to be advised. She is following up on MRI . Advised per the below. Advised pt that the office will call her to advise of the order information once completed.

## 2018-01-19 NOTE — Telephone Encounter (Signed)
See note

## 2018-01-26 ENCOUNTER — Ambulatory Visit (HOSPITAL_COMMUNITY)
Admission: RE | Admit: 2018-01-26 | Discharge: 2018-01-26 | Disposition: A | Discharge status: Home / Self Care | Payer: 59 | Source: Ambulatory Visit | Attending: Family Medicine | Admitting: Family Medicine

## 2018-01-26 ENCOUNTER — Ambulatory Visit: Payer: Self-pay | Admitting: Family Medicine

## 2018-01-26 DIAGNOSIS — M7989 Other specified soft tissue disorders: Secondary | ICD-10-CM | POA: Diagnosis not present

## 2018-01-26 DIAGNOSIS — R2241 Localized swelling, mass and lump, right lower limb: Secondary | ICD-10-CM | POA: Diagnosis not present

## 2018-01-26 LAB — POCT I-STAT CREATININE: Creatinine, Ser: 0.8 mg/dL (ref 0.44–1.00)

## 2018-01-26 MED ORDER — GADOBUTROL 1 MMOL/ML IV SOLN
7.5000 mL | Freq: Once | INTRAVENOUS | Status: AC | PRN
  Administered 2018-01-26: 7.5 mL via INTRAVENOUS

## 2018-01-27 NOTE — Progress Notes (Signed)
Please inform patient of the following:  The radiologist reviewed her MRI and it looks like the areas of concern are most likely scar tissue and areas of prior inflammation.  There were no signs of cancer or any other concerning findings.  They did not recommend any further work up at this time.  We should continue to keep an eye on the area. She should let us know if they increase in size or change in any other way.  [name]. [name], MD 01/27/2018 9:05 AM

## 2018-02-04 MED FILL — valACYclovir HCL 1 GM TABS: 1 | 30 days supply | Qty: 30 | Fill #2

## 2018-02-19 DIAGNOSIS — M1612 Unilateral primary osteoarthritis, left hip: Secondary | ICD-10-CM | POA: Diagnosis not present

## 2018-02-19 DIAGNOSIS — M25552 Pain in left hip: Secondary | ICD-10-CM | POA: Diagnosis not present

## 2018-03-06 MED FILL — valACYclovir HCL 1 GM TABS: 1 | 30 days supply | Qty: 30 | Fill #3

## 2018-03-26 ENCOUNTER — Telehealth: Payer: Self-pay

## 2018-03-26 NOTE — Telephone Encounter (Signed)
Could you please call and schedule this patient for a surgical clearance appointment?  Thank you!

## 2018-03-26 NOTE — Telephone Encounter (Signed)
Called pt and did not get an answer. LVM and sent CRM!

## 2018-04-06 MED FILL — valACYclovir HCL 1 GM TABS: 1 | 30 days supply | Qty: 30 | Fill #4

## 2018-04-24 ENCOUNTER — Other Ambulatory Visit: Payer: Self-pay | Admitting: Obstetrics & Gynecology

## 2018-04-24 DIAGNOSIS — Z1231 Encounter for screening mammogram for malignant neoplasm of breast: Secondary | ICD-10-CM

## 2018-05-05 MED FILL — valACYclovir HCL 1 GM TABS: 1 | 30 days supply | Qty: 30 | Fill #5

## 2018-05-13 NOTE — H&P (Signed)
TOTAL HIP ADMISSION H&P  Patient is admitted for left total hip arthroplasty.  Subjective:  Chief Complaint: left hip pain  HPI: Sandra Clay, 63 y.o. female, has a history of pain and functional disability in the left hip(s) due to arthritis and patient has failed non-surgical conservative treatments for greater than 12 weeks to include activity modification.  Onset of symptoms was gradual starting several years ago with gradually worsening course since that time.The patient noted no past surgery on the left hip(s).  Patient currently rates pain in the left hip at 7 out of 10 with activity. Patient has worsening of pain with activity and weight bearing, pain that interfers with activities of daily living and instability. Patient has evidence of bone-on-bone arthritis with large osteophyte formation and subchondral cysts by imaging studies. This condition presents safety issues increasing the risk of falls. There is no current active infection.  Patient Active Problem List   Diagnosis Date Noted  . Skin lump of leg, right 12/30/2017  . Genital herpes 12/30/2017   Past Medical History:  Diagnosis Date  . Anxiety   . Depression   . DSKAJGOT(157.2)     Past Surgical History:  Procedure Laterality Date  . BUNIONECTOMY    . CESAREAN SECTION     x3  . CHOLECYSTECTOMY    . FRACTURE SURGERY    . KNEE ARTHROSCOPY W/ MENISCAL REPAIR    . ROTATOR CUFF REPAIR    . TUBAL LIGATION      No current facility-administered medications for this encounter.    Current Outpatient Medications  Medication Sig Dispense Refill Last Dose  . Calcium 200 MG TABS Take by mouth.   Taking  . cholecalciferol (VITAMIN D) 1000 units tablet Take 1,000 Units by mouth daily.   Taking  . valACYclovir (VALTREX) 1000 MG tablet Take 1 g by mouth daily.  12 Taking   Allergies  Allergen Reactions  . Dilaudid [Hydromorphone] Itching  . Sulfa Antibiotics     rash    Social History   Tobacco Use  . Smoking status:  Former Research scientist (life sciences)  . Smokeless tobacco: Never Used  Substance Use Topics  . Alcohol use: Yes    Alcohol/week: 1.0 - 2.0 standard drinks    Types: 1 - 2 Cans of beer per week    Comment: one to two beers per day     Family History  Problem Relation Age of Onset  . Alcohol abuse Mother   . Breast cancer Mother   . Alcohol abuse Father   . Arthritis Father   . Cancer Father   . Diabetes Father   . Hearing loss Father   . Alcohol abuse Brother   . Cancer Brother   . Depression Brother   . Drug abuse Brother   . Early death Brother   . Hearing loss Brother   . Depression Sister   . Alcohol abuse Sister   . Breast cancer Maternal Aunt   . Colon cancer Neg Hx      Review of Systems  Constitutional: Negative for chills and fever.  HENT: Negative for congestion, sore throat and tinnitus.   Eyes: Negative for double vision, photophobia and pain.  Respiratory: Negative for cough, shortness of breath and wheezing.   Cardiovascular: Negative for chest pain, palpitations and orthopnea.  Gastrointestinal: Negative for heartburn, nausea and vomiting.  Genitourinary: Negative for dysuria, frequency and urgency.  Musculoskeletal: Positive for joint pain.  Neurological: Negative for dizziness, weakness and headaches.  Objective:  Physical Exam  Well nourished and well developed.  General: Alert and oriented x3, cooperative and pleasant, no acute distress.  Head: normocephalic, atraumatic, neck supple.  Eyes: EOMI.  Respiratory: breath sounds clear in all fields, no wheezing, rales, or rhonchi. Cardiovascular: Regular rate and rhythm, no murmurs, gallops or rubs.  Abdomen: non-tender to palpation and soft, normoactive bowel sounds. Musculoskeletal:  Left Hip Exam: ROM: Flexion to 110, Internal Rotation is minimal, External Rotation 10-20, and Abduction 20 degrees. There is no tenderness over the greater trochanter bursa.   Calves soft and nontender. Motor function intact in LE.  Strength 5/5 LE bilaterally. Neuro: Distal pulses 2+. Sensation to light touch intact in LE.  Vital signs in last 24 hours: Blood pressure: 144/80 mmHg Pulse: 80 bpm  Labs:   Estimated body mass index is 27.86 kg/m as calculated from the following:   Height as of 12/30/17: 5\' 6"  (1.676 m).   Weight as of 12/30/17: 78.3 kg.   Imaging Review Plain radiographs demonstrate severe degenerative joint disease of the left hip(s). The bone quality appears to be adequate for age and reported activity level.      Assessment/Plan:  End stage arthritis, left hip(s)  The patient history, physical examination, clinical judgement of the provider and imaging studies are consistent with end stage degenerative joint disease of the left hip(s) and total hip arthroplasty is deemed medically necessary. The treatment options including medical management, injection therapy, arthroscopy and arthroplasty were discussed at length. The risks and benefits of total hip arthroplasty were presented and reviewed. The risks due to aseptic loosening, infection, stiffness, dislocation/subluxation,  thromboembolic complications and other imponderables were discussed.  The patient acknowledged the explanation, agreed to proceed with the plan and consent was signed. Patient is being admitted for inpatient treatment for surgery, pain control, PT, OT, prophylactic antibiotics, VTE prophylaxis, progressive ambulation and ADL's and discharge planning.The patient is planning to be discharged home.   Anticipated LOS equal to or greater than 2 midnights due to - Age 64 and older with one or more of the following:  - Obesity  - Expected need for hospital services (PT, OT, Nursing) required for safe  discharge  - Anticipated need for postoperative skilled nursing care or inpatient rehab  - Active co-morbidities: None OR   - Unanticipated findings during/Post Surgery: None  - Patient is a high risk of re-admission due to:  None   Therapy Plans: HEP Disposition: Home with fiance Planned DVT Prophylaxis: Aspirin 325 mg BID DME needed: [facility] PCP: Dimas Chyle, MD TXA: IV Allergies: Sulfa (rash), opioids (nausea) Anesthesia Concerns: None BMI: 26.2  - Patient was instructed on what medications to stop prior to surgery. - Follow-up visit in 2 weeks with Dr. Wynelle Link - Begin physical therapy following surgery - Pre-operative lab work as pre-surgical testing - Prescriptions will be provided in hospital at time of discharge  Theresa Duty, PA-C Orthopedic Surgery [facility] [place]

## 2018-05-18 ENCOUNTER — Encounter: Payer: Self-pay | Admitting: Family Medicine

## 2018-05-18 ENCOUNTER — Ambulatory Visit: Payer: 59 | Admitting: Family Medicine

## 2018-05-18 VITALS — BP 132/74 | HR 75 | Temp 97.7°F | Ht 66.0 in | Wt 176.4 lb

## 2018-05-18 DIAGNOSIS — Z01818 Encounter for other preprocedural examination: Secondary | ICD-10-CM | POA: Diagnosis not present

## 2018-05-18 DIAGNOSIS — R739 Hyperglycemia, unspecified: Secondary | ICD-10-CM | POA: Insufficient documentation

## 2018-05-18 LAB — POCT URINALYSIS DIPSTICK
BILIRUBIN UA: NEGATIVE
Blood, UA: NEGATIVE
Glucose, UA: NEGATIVE
Ketones, UA: NEGATIVE
Nitrite, UA: NEGATIVE
Protein, UA: NEGATIVE
Spec Grav, UA: 1.02 (ref 1.010–1.025)
Urobilinogen, UA: 0.2 E.U./dL
pH, UA: 6 (ref 5.0–8.0)

## 2018-05-18 LAB — COMPREHENSIVE METABOLIC PANEL
ALT: 19 U/L (ref 0–35)
AST: 25 U/L (ref 0–37)
Albumin: 4.3 g/dL (ref 3.5–5.2)
Alkaline Phosphatase: 87 U/L (ref 39–117)
BUN: 15 mg/dL (ref 6–23)
CO2: 27 mEq/L (ref 19–32)
Calcium: 9.3 mg/dL (ref 8.4–10.5)
Chloride: 104 mEq/L (ref 96–112)
Creatinine, Ser: 0.82 mg/dL (ref 0.40–1.20)
GFR: 69.94 mL/min (ref >60.00)
Glucose, Bld: 83 mg/dL (ref 70–99)
Potassium: 4.1 mEq/L (ref 3.5–5.1)
Sodium: 140 mEq/L (ref 135–145)
Total Bilirubin: 0.6 mg/dL (ref 0.2–1.2)
Total Protein: 7.3 g/dL (ref 6.0–8.3)

## 2018-05-18 LAB — CBC
HCT: 41.4 % (ref 36.0–46.0)
Hemoglobin: 14.2 g/dL (ref 12.0–15.0)
MCHC: 34.1 g/dL (ref 30.0–36.0)
MCV: 96.2 fl (ref 78.0–100.0)
Platelets: 254 10*3/uL (ref 150.0–400.0)
RBC: 4.31 Mil/uL (ref 3.87–5.11)
RDW: 13.4 % (ref 11.5–15.5)
WBC: 5.1 10*3/uL (ref 4.0–10.5)

## 2018-05-18 LAB — HEMOGLOBIN A1C: Hgb A1c MFr Bld: 5.9 % (ref 4.6–6.5)

## 2018-05-18 NOTE — Progress Notes (Signed)
Please inform patient of the following:  Her A1c was a little high but everything else was NORMAL. It is ok for her to have surgery - we can fax letter to her surgeon.   Regarding her sugar, would not recommend medications at this point. Would like for her to continue working on healthy diet and regular exercise and we can recheck in a year.  [name]. [name], MD 05/18/2018 2:05 PM

## 2018-05-18 NOTE — Progress Notes (Signed)
Chief Complaint:  Sandra Clay is a 63 y.o. female who presents today as a consult at the request of Dr Wynelle Link for surgical clearance for left total hip replacement.  Assessment/Plan:  Pre-Op Examination / Left Hip Pain Patient is overall low cardiovascular risk.  Her EKG shows normal sinus rhythm with no ischemic changes.  Will check CBC, C met, A1c, and urinalysis.  Pending results of blood work, she will be cleared to have left hip replacement later this month.  In the interim, she will continue using over-the-counter medications as needed for her pain.  A copy of this note will be sent to Dr Aluisio's office.    Subjective:  HPI:  Left Hip Pain  Patient symptoms started a couple of years ago.  She has been seeing orthopedics for this.  She has failed conservative management.  She occasionally takes ibuprofen which modestly helps with her symptoms.  She has tentatively scheduled to have surgery in about 3 weeks.  She will be undergoing total left hip replacement.  Overall, her symptoms are stable.  Denies any sort of chest pain, shortness of breath, weakness, numbness nausea, or vomiting.  She has excellent functional status and is able to climb a flight of stairs without becoming short of breath or experiencing chest pain.  ROS: Per HPI, otherwise a complete review of systems was negative.   PMH:  The following were reviewed and entered/updated in epic: Past Medical History:  Diagnosis Date  . Anxiety   . Depression   . DGUYQIHK(742.5)    Patient Active Problem List   Diagnosis Date Noted  . Skin lump of leg, right 12/30/2017  . Genital herpes 12/30/2017   Past Surgical History:  Procedure Laterality Date  . BUNIONECTOMY    . CESAREAN SECTION     x3  . CHOLECYSTECTOMY    . FRACTURE SURGERY    . KNEE ARTHROSCOPY W/ MENISCAL REPAIR    . ROTATOR CUFF REPAIR    . TUBAL LIGATION      Family History  Problem Relation Age of Onset  . Alcohol abuse Mother   . Breast  cancer Mother   . Alcohol abuse Father   . Arthritis Father   . Cancer Father   . Diabetes Father   . Hearing loss Father   . Alcohol abuse Brother   . Cancer Brother   . Depression Brother   . Drug abuse Brother   . Early death Brother   . Hearing loss Brother   . Depression Sister   . Alcohol abuse Sister   . Breast cancer Maternal Aunt   . Colon cancer Neg Hx     Medications- reviewed and updated Current Outpatient Medications  Medication Sig Dispense Refill  . Calcium 200 MG TABS Take by mouth.    . cholecalciferol (VITAMIN D) 1000 units tablet Take 1,000 Units by mouth daily.    . valACYclovir (VALTREX) 1000 MG tablet Take 1 g by mouth daily.  12   No current facility-administered medications for this visit.     Allergies-reviewed and updated Allergies  Allergen Reactions  . Dilaudid [Hydromorphone] Itching  . Sulfa Antibiotics     rash    Social History   Socioeconomic History  . Marital status: Single    Spouse name: Not on file  . Number of children: Not on file  . Years of education: Not on file  . Highest education level: Not on file  Occupational History  . Not on file  Social Needs  . Financial resource strain: Not on file  . Food insecurity:    Worry: Not on file    Inability: Not on file  . Transportation needs:    Medical: Not on file    Non-medical: Not on file  Tobacco Use  . Smoking status: Former Research scientist (life sciences)  . Smokeless tobacco: Never Used  Substance and Sexual Activity  . Alcohol use: Yes    Alcohol/week: 1.0 - 2.0 standard drinks    Types: 1 - 2 Cans of beer per week    Comment: one to two beers per day   . Drug use: No  . Sexual activity: Yes    Birth control/protection: Post-menopausal  Lifestyle  . Physical activity:    Days per week: Not on file    Minutes per session: Not on file  . Stress: Not on file  Relationships  . Social connections:    Talks on phone: Not on file    Gets together: Not on file    Attends religious  service: Not on file    Active member of club or organization: Not on file    Attends meetings of clubs or organizations: Not on file    Relationship status: Not on file  Other Topics Concern  . Not on file  Social History Narrative  . Not on file         Objective:  Physical Exam: BP 132/74 (BP Location: Left Arm, Patient Position: Sitting, Cuff Size: Normal)   Pulse 75   Temp 97.7 F (36.5 C) (Oral)   Ht _0  (1.676 m)   Wt 176 lb 6.1 oz (80 kg)   SpO2 96%   BMI 28.47 kg/m   Gen: NAD, resting comfortably CV: Regular rate and rhythm with no murmurs appreciated Pulm: Normal work of breathing, clear to auscultation bilaterally with no crackles, wheezes, or rhonchi GI: Normal bowel sounds present. Soft, Nontender, Nondistended. MSK: No edema, cyanosis, or clubbing noted Skin: Warm, dry Neuro: Grossly normal, moves all extremities Psych: Normal affect and thought content  EKG: NSR no ischemic changes.     [name]. [name], MD 05/18/2018 8:37 AM

## 2018-05-18 NOTE — Patient Instructions (Signed)
It was very nice to see you today!  Your EKG is normal.  We will check blood work and a urine sample today.  Assuming all this is normal, you will be cleared for surgery.  We will fax these results over to Dr. Anne Fu office.  Take care, Dr [name]

## 2018-05-25 ENCOUNTER — Ambulatory Visit
Admission: RE | Admit: 2018-05-25 | Discharge: 2018-05-25 | Disposition: A | Discharge status: Home / Self Care | Payer: 59 | Source: Ambulatory Visit | Attending: Obstetrics & Gynecology | Admitting: Obstetrics & Gynecology

## 2018-05-25 DIAGNOSIS — Z1231 Encounter for screening mammogram for malignant neoplasm of breast: Secondary | ICD-10-CM | POA: Diagnosis not present

## 2018-05-26 DIAGNOSIS — M1612 Unilateral primary osteoarthritis, left hip: Secondary | ICD-10-CM | POA: Diagnosis not present

## 2018-06-02 MED FILL — valACYclovir HCL 1 GM TABS: 1 | 90 days supply | Qty: 90 | Fill #6

## 2018-06-05 ENCOUNTER — Encounter (HOSPITAL_COMMUNITY): Admission: RE | Admit: 2018-06-05 | Payer: 59 | Source: Ambulatory Visit

## 2018-06-10 ENCOUNTER — Encounter (HOSPITAL_COMMUNITY): Admission: RE | Payer: Self-pay | Source: Home / Self Care

## 2018-06-10 ENCOUNTER — Inpatient Hospital Stay (HOSPITAL_COMMUNITY): Admission: RE | Admit: 2018-06-10 | Payer: 59 | Source: Home / Self Care | Admitting: Orthopedic Surgery

## 2018-06-10 SURGERY — ARTHROPLASTY, HIP, TOTAL, ANTERIOR APPROACH
Anesthesia: Choice | Laterality: Left

## 2018-07-24 NOTE — Patient Instructions (Addendum)
Sandra Clay  07/24/2018   Your procedure is scheduled on: Monday 08/03/2018   Report to [facility] Main  Entrance              Report to Short Stay  At 5:30  AM    Call this number if you have problems the morning of surgery [phone]    Remember: Do not eat food After Midnight. YOU MAY HAVE CLEAR LIQUIDS FROM MIDNIGHT UNTIL 4:30AM. At 4:30AM Please finish the prescribed Pre-Surgery ENSURE drink. Nothing by mouth after you finish the ENSURE drink !     CLEAR LIQUID DIET   Foods Allowed                                                                     Foods Excluded  Coffee and tea, regular and decaf                             liquids that you cannot  Plain Jell-O in any flavor                                             see through such as: Fruit ices (not with fruit pulp)                                     milk, soups, orange juice  Iced Popsicles                                    All solid food Carbonated beverages, regular and diet                                    Cranberry, grape and apple juices Sports drinks like Gatorade Lightly seasoned clear broth or consume(fat free) Sugar, honey syrup  Sample Menu Breakfast                                Lunch                                     Supper Cranberry juice                    Beef broth                            Chicken broth Jell-O                                     Grape juice  Apple juice Coffee or tea                        Jell-O                                      Popsicle                                                Coffee or tea                        Coffee or tea  _____________________________________________________________________     BRUSH YOUR TEETH MORNING OF SURGERY AND RINSE YOUR MOUTH OUT, NO CHEWING GUM CANDY OR MINTS.     Take these medicines the morning of surgery with A SIP OF WATER: none                                 You may  not have any metal on your body including hair pins and              piercings  Do not wear jewelry, make-up, lotions, powders or perfumes, deodorant             Do not wear nail polish.  Do not shave  48 hours prior to surgery.               Do not bring valuables to the hospital. [place].  Contacts, dentures or bridgework may not be worn into surgery.  Leave suitcase in the car. After surgery it may be brought to your room.                   Please read over the following fact sheets you were given: _____________________________________________________________________             [facility] - Preparing for Surgery Before surgery, you can play an important role.  Because skin is not sterile, your skin needs to be as free of germs as possible.  You can reduce the number of germs on your skin by washing with CHG (chlorahexidine gluconate) soap before surgery.  CHG is an antiseptic cleaner which kills germs and bonds with the skin to continue killing germs even after washing. Please DO NOT use if you have an allergy to CHG or antibacterial soaps.  If your skin becomes reddened/irritated stop using the CHG and inform your nurse when you arrive at Short Stay. Do not shave (including legs and underarms) for at least 48 hours prior to the first CHG shower.  You may shave your face/neck. Please follow these instructions carefully:  1.  Shower with CHG Soap the night before surgery and the  morning of Surgery.  2.  If you choose to wash your hair, wash your hair first as usual with your  normal  shampoo.  3.  After you shampoo, rinse your hair and body thoroughly to remove the  shampoo.  4.  Use CHG as you would any other liquid soap.  You can apply chg directly  to the skin and wash                       Gently with a scrungie or clean washcloth.  5.  Apply the CHG Soap to your body ONLY FROM THE NECK DOWN.   Do not use  on face/ open                           Wound or open sores. Avoid contact with eyes, ears mouth and genitals (private parts).                       Wash face,  Genitals (private parts) with your normal soap.             6.  Wash thoroughly, paying special attention to the area where your surgery  will be performed.  7.  Thoroughly rinse your body with warm water from the neck down.  8.  DO NOT shower/wash with your normal soap after using and rinsing off  the CHG Soap.                9.  Pat yourself dry with a clean towel.            10.  Wear clean pajamas.            11.  Place clean sheets on your bed the night of your first shower and do not  sleep with pets. Day of Surgery : Do not apply any lotions/deodorants the morning of surgery.  Please wear clean clothes to the hospital/surgery center.  FAILURE TO FOLLOW THESE INSTRUCTIONS MAY RESULT IN THE CANCELLATION OF YOUR SURGERY PATIENT SIGNATURE_________________________________  NURSE SIGNATURE__________________________________  ________________________________________________________________________   Sandra Clay  An incentive spirometer is a tool that can help keep your lungs clear and active. This tool measures how well you are filling your lungs with each breath. Taking long deep breaths may help reverse or decrease the chance of developing breathing (pulmonary) problems (especially infection) following:  A long period of time when you are unable to move or be active. BEFORE THE PROCEDURE   If the spirometer includes an indicator to show your best effort, your nurse or respiratory therapist will set it to a desired goal.  If possible, sit up straight or lean slightly forward. Try not to slouch.  Hold the incentive spirometer in an upright position. INSTRUCTIONS FOR USE  1. Sit on the edge of your bed if possible, or sit up as far as you can in bed or on a chair. 2. Hold the incentive spirometer in an upright  position. 3. Breathe out normally. 4. Place the mouthpiece in your mouth and seal your lips tightly around it. 5. Breathe in slowly and as deeply as possible, raising the piston or the ball toward the top of the column. 6. Hold your breath for 3-5 seconds or for as long as possible. Allow the piston or ball to fall to the bottom of the column. 7. Remove the mouthpiece from your mouth and breathe out normally. 8. Rest for a few seconds and repeat Steps 1 through 7 at least 10 times every 1-2 hours when you are awake. Take your time and take a few normal breaths between deep breaths. 9. The spirometer may include an indicator to  show your best effort. Use the indicator as a goal to work toward during each repetition. 10. After each set of 10 deep breaths, practice coughing to be sure your lungs are clear. If you have an incision (the cut made at the time of surgery), support your incision when coughing by placing a pillow or rolled up towels firmly against it. Once you are able to get out of bed, walk around indoors and cough well. You may stop using the incentive spirometer when instructed by your caregiver.  RISKS AND COMPLICATIONS  Take your time so you do not get dizzy or light-headed.  If you are in pain, you may need to take or ask for pain medication before doing incentive spirometry. It is harder to take a deep breath if you are having pain. AFTER USE  Rest and breathe slowly and easily.  It can be helpful to keep track of a log of your progress. Your caregiver can provide you with a simple table to help with this. If you are using the spirometer at home, follow these instructions: [place] IF:   You are having difficultly using the spirometer.  You have trouble using the spirometer as often as instructed.  Your pain medication is not giving enough relief while using the spirometer.  You develop fever of 100.5 F (38.1 C) or higher. SEEK IMMEDIATE MEDICAL CARE IF:    You cough up bloody sputum that had not been present before.  You develop fever of 102 F (38.9 C) or greater.  You develop worsening pain at or near the incision site. MAKE SURE YOU:   Understand these instructions.  Will watch your condition.  Will get help right away if you are not doing well or get worse. Document Released: 07/15/2006 Document Revised: 05/27/2011 Document Reviewed: 09/15/2006 ExitCare Patient Information 2014 Exit[employer], [place].   ________________________________________________________________________  WHAT IS A BLOOD TRANSFUSION? Blood Transfusion Information  A transfusion is the replacement of blood or some of its parts. Blood is made up of multiple cells which provide different functions.  Red blood cells carry oxygen and are used for blood loss replacement.  White blood cells fight against infection.  Platelets control bleeding.  Plasma helps clot blood.  Other blood products are available for specialized needs, such as hemophilia or other clotting disorders. BEFORE THE TRANSFUSION  Who gives blood for transfusions?   Healthy volunteers who are fully evaluated to make sure their blood is safe. This is blood bank blood. Transfusion therapy is the safest it has ever been in the practice of medicine. Before blood is taken from a donor, a complete history is taken to make sure that person has no history of diseases nor engages in risky social behavior (examples are intravenous drug use or sexual activity with multiple partners). The donor's travel history is screened to minimize risk of transmitting infections, such as malaria. The donated blood is tested for signs of infectious diseases, such as HIV and hepatitis. The blood is then tested to be sure it is compatible with you in order to minimize the chance of a transfusion reaction. If you or a relative donates blood, this is often done in anticipation of surgery and is not appropriate for emergency  situations. It takes many days to process the donated blood. RISKS AND COMPLICATIONS Although transfusion therapy is very safe and saves many lives, the main dangers of transfusion include:   Getting an infectious disease.  Developing a transfusion reaction. This is an allergic reaction  to something in the blood you were given. Every precaution is taken to prevent this. The decision to have a blood transfusion has been considered carefully by your caregiver before blood is given. Blood is not given unless the benefits outweigh the risks. AFTER THE TRANSFUSION  Right after receiving a blood transfusion, you will usually feel much better and more energetic. This is especially true if your red blood cells have gotten low (anemic). The transfusion raises the level of the red blood cells which carry oxygen, and this usually causes an energy increase.  The nurse administering the transfusion will monitor you carefully for complications. HOME CARE INSTRUCTIONS  No special instructions are needed after a transfusion. You may find your energy is better. Speak with your caregiver about any limitations on activity for underlying diseases you may have. SEEK MEDICAL CARE IF:   Your condition is not improving after your transfusion.  You develop redness or irritation at the intravenous (IV) site. SEEK IMMEDIATE MEDICAL CARE IF:  Any of the following symptoms occur over the next 12 hours:  Shaking chills.  You have a temperature by mouth above 102 F (38.9 C), not controlled by medicine.  Chest, back, or muscle pain.  People around you feel you are not acting correctly or are confused.  Shortness of breath or difficulty breathing.  Dizziness and fainting.  You get a rash or develop hives.  You have a decrease in urine output.  Your urine turns a dark color or changes to pink, red, or brown. Any of the following symptoms occur over the next 10 days:  You have a temperature by mouth above  102 F (38.9 C), not controlled by medicine.  Shortness of breath.  Weakness after normal activity.  The white part of the eye turns yellow (jaundice).  You have a decrease in the amount of urine or are urinating less often.  Your urine turns a dark color or changes to pink, red, or brown. Document Released: 03/01/2000 Document Revised: 05/27/2011 Document Reviewed: 10/19/2007 [facility] Patient Information 2014 [place], [place].  _______________________________________________________________________

## 2018-07-27 ENCOUNTER — Other Ambulatory Visit: Payer: Self-pay

## 2018-07-27 ENCOUNTER — Encounter (HOSPITAL_COMMUNITY)
Admission: RE | Admit: 2018-07-27 | Discharge: 2018-07-27 | Disposition: A | Discharge status: Home / Self Care | Payer: 59 | Source: Ambulatory Visit | Attending: Orthopedic Surgery | Admitting: Orthopedic Surgery

## 2018-07-27 ENCOUNTER — Encounter (HOSPITAL_COMMUNITY): Payer: Self-pay

## 2018-07-27 DIAGNOSIS — M1612 Unilateral primary osteoarthritis, left hip: Secondary | ICD-10-CM | POA: Insufficient documentation

## 2018-07-27 DIAGNOSIS — Z01812 Encounter for preprocedural laboratory examination: Secondary | ICD-10-CM | POA: Diagnosis not present

## 2018-07-27 HISTORY — DX: Other specified postprocedural states: R11.2

## 2018-07-27 HISTORY — DX: Unspecified osteoarthritis, unspecified site: M19.90

## 2018-07-27 HISTORY — DX: Other specified postprocedural states: Z98.890

## 2018-07-27 HISTORY — DX: Malignant (primary) neoplasm, unspecified: C80.1

## 2018-07-27 LAB — COMPREHENSIVE METABOLIC PANEL
ALT: 25 U/L (ref 0–44)
AST: 29 U/L (ref 15–41)
Albumin: 4.1 g/dL (ref 3.5–5.0)
Alkaline Phosphatase: 103 U/L (ref 38–126)
Anion gap: 9 (ref 5–15)
BUN: 20 mg/dL (ref 8–23)
CO2: 23 mmol/L (ref 22–32)
Calcium: 9.2 mg/dL (ref 8.9–10.3)
Chloride: 106 mmol/L (ref 98–111)
Creatinine, Ser: 0.8 mg/dL (ref 0.44–1.00)
GFR calc Af Amer: >60 mL/min (ref >60)
GFR calc non Af Amer: >60 mL/min (ref >60)
Glucose, Bld: 100 mg/dL — ABNORMAL HIGH (ref 70–99)
Potassium: 4.5 mmol/L (ref 3.5–5.1)
Sodium: 138 mmol/L (ref 135–145)
Total Bilirubin: 0.5 mg/dL (ref 0.3–1.2)
Total Protein: 7.6 g/dL (ref 6.5–8.1)

## 2018-07-27 LAB — CBC
HCT: 42.1 % (ref 36.0–46.0)
Hemoglobin: 14.1 g/dL (ref 12.0–15.0)
MCH: 33.3 pg (ref 26.0–34.0)
MCHC: 33.5 g/dL (ref 30.0–36.0)
MCV: 99.3 fL (ref 80.0–100.0)
Platelets: 231 10*3/uL (ref 150–400)
RBC: 4.24 MIL/uL (ref 3.87–5.11)
RDW: 12.5 % (ref 11.5–15.5)
WBC: 5.4 10*3/uL (ref 4.0–10.5)
nRBC: 0 % (ref 0.0–0.2)

## 2018-07-27 LAB — ABO/RH: ABO/RH(D): O POS

## 2018-07-27 LAB — APTT: aPTT: 29 seconds (ref 24–36)

## 2018-07-27 LAB — PROTIME-INR
INR: 0.9 (ref 0.8–1.2)
Prothrombin Time: 12.2 seconds (ref 11.4–15.2)

## 2018-07-27 LAB — SURGICAL PCR SCREEN
MRSA, PCR: NEGATIVE
Staphylococcus aureus: NEGATIVE

## 2018-07-28 NOTE — Progress Notes (Signed)
Anesthesia Chart Review   Case:  [id number] Date/Time:  08/03/18 0700   Procedure:  TOTAL HIP ARTHROPLASTY ANTERIOR APPROACH (Left )   Anesthesia type:  Choice   Pre-op diagnosis:  left hip osteoarthritis   Location:  [facility] ROOM 09 / [facility]   Surgeon:  Gaynelle Arabian, MD      DISCUSSION: 63 yo former smoker with h/o PONV, anxiety, depression, left hip OA scheduled for above procedure 08/03/18 with Dr. Gaynelle Arabian.   Pt seen by PCP, Dr. Dimas Chyle, for surgical clearance.  Per his note, "Her A1c was a little high but everything else was NORMAL. It is ok for her to have surgery"  Pt can proceed with planned procedure barring acute status change.  VS: BP (!) 149/82   Pulse 80   Temp 37.1 C (Oral)   Resp 16   SpO2 100%   PROVIDERS: Vivi Barrack, MD is PCP    LABS: Labs reviewed: Acceptable for surgery. (all labs ordered are listed, but only abnormal results are displayed)  Labs Reviewed  COMPREHENSIVE METABOLIC PANEL - Abnormal; Notable for the following components:      Result Value   Glucose, Bld 100 (*)    All other components within normal limits  SURGICAL PCR SCREEN  APTT  CBC  PROTIME-INR  TYPE AND SCREEN  ABO/RH     IMAGES:   EKG: 05/18/2018 Rate 72 bpm Sinus rhythm   CV:  Past Medical History:  Diagnosis Date  . Anxiety   . Cancer ([place])    skin cancer   . Depression    situational , resolved   . Headache(784.0)   . Osteoarthritis   . PONV (postoperative nausea and vomiting)     Past Surgical History:  Procedure Laterality Date  . BUNIONECTOMY    . CESAREAN SECTION     x3  . CHOLECYSTECTOMY    . FRACTURE SURGERY    . KNEE ARTHROSCOPY W/ MENISCAL REPAIR Right   . ROTATOR CUFF REPAIR     right   . TUBAL LIGATION      MEDICATIONS: . OVER THE COUNTER MEDICATION  . valACYclovir (VALTREX) 1000 MG tablet   No current facility-administered medications for this encounter.    Maia Plan [facility] Pre-Surgical Testing [phone] 07/28/18 3:17 PM

## 2018-07-28 NOTE — Anesthesia Preprocedure Evaluation (Addendum)
Anesthesia Evaluation  Patient identified by MRN, date of birth, ID band Patient awake    Reviewed: Allergy & Precautions, NPO status , Patient's Chart, lab work & pertinent test results  History of Anesthesia Complications (+) PONV  Airway Mallampati: II  TM Distance: >3 FB Neck ROM: Full    Dental no notable dental hx. (+) Teeth Intact   Pulmonary former smoker,    Pulmonary exam normal breath sounds clear to auscultation       Cardiovascular Exercise Tolerance: Good Normal cardiovascular exam Rhythm:Regular Rate:Normal  EKG 05-18-18 SR R72   Neuro/Psych  Headaches, Depression    GI/Hepatic negative GI ROS, Neg liver ROS,   Endo/Other    Renal/GU Cr 0.80 K+ 4.5     Musculoskeletal  (+) Arthritis , Osteoarthritis,    Abdominal   Peds  Hematology Hgb 14.1 Plt 221 INR 0.9   Anesthesia Other Findings   Reproductive/Obstetrics                           Anesthesia Physical Anesthesia Plan  ASA: II  Anesthesia Plan: Spinal   Post-op Pain Management:    Induction:   PONV Risk Score and Plan: Treatment may vary due to age or medical condition  Airway Management Planned: Simple Face Mask and Natural Airway  Additional Equipment:   Intra-op Plan:   Post-operative Plan:   Informed Consent: I have reviewed the patients History and Physical, chart, labs and discussed the procedure including the risks, benefits and alternatives for the proposed anesthesia with the patient or authorized representative who has indicated his/her understanding and acceptance.     Dental advisory given  Plan Discussed with:   Anesthesia Plan Comments: (See PAT note 07/27/18, Konrad Felix, PA-C L THR w Spinal)      Anesthesia Quick Evaluation

## 2018-07-28 NOTE — H&P (Signed)
TOTAL HIP ADMISSION H&P  Patient is admitted for left total hip arthroplasty.  Subjective:  Chief Complaint: left hip pain  HPI: Sandra Clay, 63 y.o. female, has a history of pain and functional disability in the left hip(s) due to arthritis and patient has failed non-surgical conservative treatments for greater than 12 weeks to include activity modification.  Onset of symptoms was gradual starting 3 years ago with gradually worsening course since that time.The patient noted no past surgery on the left hip(s).  Patient currently rates pain in the left hip at 8 out of 10 with activity. Patient has worsening of pain with activity and weight bearing, pain that interfers with activities of daily living and instability. Patient has evidence of near bone-on-bone arthritis with essentially bone-on-bone superolaterally, subchondral cyst formation, and large marginal osteophyte formation by imaging studies. This condition presents safety issues increasing the risk of falls. There is no current active infection.  Patient Active Problem List   Diagnosis Date Noted  . Hyperglycemia 05/18/2018  . Skin lump of leg, right 12/30/2017  . Genital herpes 12/30/2017   Past Medical History:  Diagnosis Date  . Anxiety   . Cancer ([place])    skin cancer   . Depression    situational , resolved   . Headache(784.0)   . Osteoarthritis   . PONV (postoperative nausea and vomiting)     Past Surgical History:  Procedure Laterality Date  . BUNIONECTOMY    . CESAREAN SECTION     x3  . CHOLECYSTECTOMY    . FRACTURE SURGERY    . KNEE ARTHROSCOPY W/ MENISCAL REPAIR Right   . ROTATOR CUFF REPAIR     right   . TUBAL LIGATION      No current facility-administered medications for this encounter.    Current Outpatient Medications  Medication Sig Dispense Refill Last Dose  . valACYclovir (VALTREX) 1000 MG tablet Take 1 g by mouth daily as needed (fever blisters).   12 Taking  . OVER THE COUNTER MEDICATION Take 1  capsule by mouth at bedtime. CBD      Allergies  Allergen Reactions  . Dilaudid [Hydromorphone] Itching  . Sulfa Antibiotics Rash    Social History   Tobacco Use  . Smoking status: Former Research scientist (life sciences)  . Smokeless tobacco: Never Used  Substance Use Topics  . Alcohol use: Yes    Alcohol/week: 1.0 - 2.0 standard drinks    Types: 1 - 2 Glasses of wine per week    Comment: one to two glasses of wine per day     Family History  Problem Relation Age of Onset  . Alcohol abuse Mother   . Breast cancer Mother   . Alcohol abuse Father   . Arthritis Father   . Cancer Father   . Diabetes Father   . Hearing loss Father   . Alcohol abuse Brother   . Cancer Brother   . Depression Brother   . Drug abuse Brother   . Early death Brother   . Hearing loss Brother   . Depression Sister   . Alcohol abuse Sister   . Breast cancer Maternal Aunt   . Colon cancer Neg Hx      Review of Systems  Constitutional: Negative for chills and fever.  HENT: Negative for congestion, sore throat and tinnitus.   Eyes: Negative for double vision, photophobia and pain.  Respiratory: Negative for cough, shortness of breath and wheezing.   Cardiovascular: Negative for chest pain, palpitations and orthopnea.  Gastrointestinal: Negative for heartburn, nausea and vomiting.  Genitourinary: Negative for dysuria, frequency and urgency.  Musculoskeletal: Positive for joint pain.  Neurological: Negative for dizziness, weakness and headaches.    Objective:  Physical Exam  Well nourished and well developed.  General: Alert and oriented x3, cooperative and pleasant, no acute distress.  Head: normocephalic, atraumatic, neck supple.  Eyes: EOMI.  Respiratory: breath sounds clear in all fields, no wheezing, rales, or rhonchi. Cardiovascular: Regular rate and rhythm, no murmurs, gallops or rubs.  Abdomen: non-tender to palpation and soft, normoactive bowel sounds. Musculoskeletal:  Left Hip Exam: ROM: Flexion to 110,  Internal Rotation is minimal, External Rotation 10-20, and Abduction 20 degrees. There is no tenderness over the greater trochanter bursa.   Calves soft and nontender. Motor function intact in LE. Strength 5/5 LE bilaterally. Neuro: Distal pulses 2+. Sensation to light touch intact in LE.  Vital signs in last 24 hours: Temp:  [98.7 F (37.1 C)] 98.7 F (37.1 C) (05/11 1044) Pulse Rate:  [80] 80 (05/11 1044) Resp:  [16] 16 (05/11 1044) BP: (149)/(82) 149/82 (05/11 1044) SpO2:  [100 %] 100 % (05/11 1044)  Labs:   Estimated body mass index is 28.47 kg/m as calculated from the following:   Height as of 05/18/18: 5\' 6"  (1.676 m).   Weight as of 05/18/18: 80 kg.   Imaging Review Plain radiographs demonstrate severe degenerative joint disease of the left hip(s). The bone quality appears to be adequate for age and reported activity level.      Assessment/Plan:  End stage arthritis, left hip(s)  The patient history, physical examination, clinical judgement of the provider and imaging studies are consistent with end stage degenerative joint disease of the left hip(s) and total hip arthroplasty is deemed medically necessary. The treatment options including medical management, injection therapy, arthroscopy and arthroplasty were discussed at length. The risks and benefits of total hip arthroplasty were presented and reviewed. The risks due to aseptic loosening, infection, stiffness, dislocation/subluxation,  thromboembolic complications and other imponderables were discussed.  The patient acknowledged the explanation, agreed to proceed with the plan and consent was signed. Patient is being admitted for inpatient treatment for surgery, pain control, PT, OT, prophylactic antibiotics, VTE prophylaxis, progressive ambulation and ADL's and discharge planning.The patient is planning to be discharged home.   Anticipated LOS equal to or greater than 2 midnights due to - Age 50 and older with one or  more of the following:  - Obesity  - Expected need for hospital services (PT, OT, Nursing) required for safe  discharge  - Anticipated need for postoperative skilled nursing care or inpatient rehab  - Active co-morbidities: None OR   - Unanticipated findings during/Post Surgery: None  - Patient is a high risk of re-admission due to: None   Therapy Plans: HEP Disposition: Home with fiance Planned DVT Prophylaxis: Aspirin 325 mg BID DME needed: None PCP: Dimas Chyle, MD TXA: IV Allergies: Sulfa (rash), opioids (nausea) Anesthesia Concerns: None BMI: 26.2  - Patient was instructed on what medications to stop prior to surgery. - Follow-up visit in 2 weeks with Dr. Wynelle Link - Begin physical therapy following surgery - Pre-operative lab work as pre-surgical testing - Prescriptions will be provided in hospital at time of discharge  Theresa Duty, PA-C Orthopedic Surgery [facility] [place]

## 2018-07-29 ENCOUNTER — Other Ambulatory Visit: Payer: Self-pay | Admitting: *Deleted

## 2018-07-29 ENCOUNTER — Encounter: Payer: Self-pay | Admitting: *Deleted

## 2018-07-29 NOTE — Patient Outreach (Signed)
[facility]) Care Management  07/29/2018  Sandra Clay [birth year] [id number]   Preoperative Screening Call Referral received: n/a Surgery/Procedure date: 08/03/18 Initial outreach: 07/29/18 Insurance: [place] Choice Plan  Subjective:  Initial successful telephone call to patient's preferred number in order to complete preoperative screening. 2 HIPAA identifiers verified. Discussed purpose of preoperative call. Patient voices understanding and agrees to call. Sandra Clay states she understands the reasons for the surgery and the expected time of arrival. She says she completed her pre-op testing on 5/11//20 and has no additional questions. She says she expects to be in the hospital 1-2 days.  She states she has completed medical leave paperwork and does  have the hospital indemnity benefit and was given the [employer] phone number to start claim process. She says she will have 24/7 care at home to assist in her recovery. She does  have an advanced directives and her significant other is aware. She agrees to a post hospital discharge transition of care call.    Objective: Per the electronic medical record, Sandra Clay is scheduled for left hip arthroplasty -anterior approach on 08/03/18 at [facility]. 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   Plan: RNCM will call patient for transition of care outreach within 48 hours of hospital discharge notification.  Barrington Ellison RN,CCM,CDE [place] Management Coordinator Office Phone [phone] Office Fax [phone]

## 2018-07-31 ENCOUNTER — Other Ambulatory Visit (HOSPITAL_COMMUNITY)
Admission: RE | Admit: 2018-07-31 | Discharge: 2018-07-31 | Disposition: A | Discharge status: Home / Self Care | Payer: 59 | Source: Ambulatory Visit | Attending: Orthopedic Surgery | Admitting: Orthopedic Surgery

## 2018-07-31 ENCOUNTER — Other Ambulatory Visit: Payer: Self-pay

## 2018-07-31 DIAGNOSIS — Z1159 Encounter for screening for other viral diseases: Secondary | ICD-10-CM | POA: Insufficient documentation

## 2018-07-31 LAB — SARS CORONAVIRUS 2 BY RT PCR (HOSPITAL ORDER, PERFORMED IN ~~LOC~~ HOSPITAL LAB): SARS Coronavirus 2: NEGATIVE

## 2018-08-03 ENCOUNTER — Encounter (HOSPITAL_COMMUNITY)
Admission: RE | Disposition: A | Discharge status: Home Health | Payer: Self-pay | Source: Home / Self Care | Attending: Orthopedic Surgery

## 2018-08-03 ENCOUNTER — Inpatient Hospital Stay (HOSPITAL_COMMUNITY): Payer: 59

## 2018-08-03 ENCOUNTER — Encounter (HOSPITAL_COMMUNITY): Payer: Self-pay | Admitting: Emergency Medicine

## 2018-08-03 ENCOUNTER — Inpatient Hospital Stay (HOSPITAL_COMMUNITY): Payer: 59 | Admitting: Anesthesiology

## 2018-08-03 ENCOUNTER — Inpatient Hospital Stay (HOSPITAL_COMMUNITY)
Admission: RE | Admit: 2018-08-03 | Discharge: 2018-08-05 | DRG: 470 | Disposition: A | Discharge status: Home Health | Payer: 59 | Attending: Orthopedic Surgery | Admitting: Orthopedic Surgery

## 2018-08-03 ENCOUNTER — Other Ambulatory Visit: Payer: Self-pay

## 2018-08-03 ENCOUNTER — Inpatient Hospital Stay (HOSPITAL_COMMUNITY): Payer: 59 | Admitting: Physician Assistant

## 2018-08-03 DIAGNOSIS — Z87891 Personal history of nicotine dependence: Secondary | ICD-10-CM

## 2018-08-03 DIAGNOSIS — M25559 Pain in unspecified hip: Secondary | ICD-10-CM

## 2018-08-03 DIAGNOSIS — R229 Localized swelling, mass and lump, unspecified: Secondary | ICD-10-CM | POA: Diagnosis not present

## 2018-08-03 DIAGNOSIS — I959 Hypotension, unspecified: Secondary | ICD-10-CM | POA: Diagnosis not present

## 2018-08-03 DIAGNOSIS — Z85828 Personal history of other malignant neoplasm of skin: Secondary | ICD-10-CM | POA: Diagnosis not present

## 2018-08-03 DIAGNOSIS — Z96649 Presence of unspecified artificial hip joint: Secondary | ICD-10-CM

## 2018-08-03 DIAGNOSIS — M169 Osteoarthritis of hip, unspecified: Secondary | ICD-10-CM | POA: Diagnosis present

## 2018-08-03 DIAGNOSIS — Z1159 Encounter for screening for other viral diseases: Secondary | ICD-10-CM | POA: Diagnosis not present

## 2018-08-03 DIAGNOSIS — M1612 Unilateral primary osteoarthritis, left hip: Principal | ICD-10-CM | POA: Diagnosis present

## 2018-08-03 DIAGNOSIS — R739 Hyperglycemia, unspecified: Secondary | ICD-10-CM | POA: Diagnosis not present

## 2018-08-03 DIAGNOSIS — Z96642 Presence of left artificial hip joint: Secondary | ICD-10-CM | POA: Diagnosis not present

## 2018-08-03 DIAGNOSIS — M25552 Pain in left hip: Secondary | ICD-10-CM | POA: Diagnosis present

## 2018-08-03 DIAGNOSIS — Z471 Aftercare following joint replacement surgery: Secondary | ICD-10-CM | POA: Diagnosis not present

## 2018-08-03 HISTORY — PX: TOTAL HIP ARTHROPLASTY: SHX124

## 2018-08-03 LAB — TYPE AND SCREEN
ABO/RH(D): O POS
Antibody Screen: NEGATIVE

## 2018-08-03 SURGERY — ARTHROPLASTY, HIP, TOTAL, ANTERIOR APPROACH
Anesthesia: Spinal | Site: Hip | Laterality: Left

## 2018-08-03 MED ORDER — PROPOFOL 10 MG/ML IV BOLUS
INTRAVENOUS | Status: AC
  Filled 2018-08-03: qty 40

## 2018-08-03 MED ORDER — MENTHOL 3 MG MT LOZG: 1.0000 | LOZENGE | OROMUCOSAL | Status: DC | PRN

## 2018-08-03 MED ORDER — DEXAMETHASONE SODIUM PHOSPHATE 10 MG/ML IJ SOLN
10.0000 mg | Freq: Once | INTRAMUSCULAR | Status: AC
  Administered 2018-08-04: 10 mg via INTRAVENOUS
  Filled 2018-08-03: qty 1

## 2018-08-03 MED ORDER — KETOROLAC TROMETHAMINE 30 MG/ML IJ SOLN: 30.0000 mg | Freq: Once | INTRAMUSCULAR | Status: DC | PRN

## 2018-08-03 MED ORDER — DIPHENHYDRAMINE HCL 12.5 MG/5ML PO ELIX
12.5000 mg | ORAL_SOLUTION | ORAL | Status: DC | PRN
  Administered 2018-08-04: 25 mg via ORAL
  Filled 2018-08-03: qty 10

## 2018-08-03 MED ORDER — DEXAMETHASONE SODIUM PHOSPHATE 10 MG/ML IJ SOLN
INTRAMUSCULAR | Status: AC
  Filled 2018-08-03: qty 1

## 2018-08-03 MED ORDER — ACETAMINOPHEN 500 MG PO TABS
500.0000 mg | ORAL_TABLET | Freq: Four times a day (QID) | ORAL | Status: AC
  Administered 2018-08-03 (×3): 500 mg via ORAL
  Filled 2018-08-03 (×3): qty 1

## 2018-08-03 MED ORDER — FENTANYL CITRATE (PF) 100 MCG/2ML IJ SOLN
INTRAMUSCULAR | Status: DC | PRN
  Administered 2018-08-03 (×2): 50 ug via INTRAVENOUS

## 2018-08-03 MED ORDER — METHOCARBAMOL 500 MG IVPB - SIMPLE MED
500.0000 mg | Freq: Four times a day (QID) | INTRAVENOUS | Status: DC | PRN
  Administered 2018-08-03: 500 mg via INTRAVENOUS
  Filled 2018-08-03: qty 50
  Filled 2018-08-03: qty 500

## 2018-08-03 MED ORDER — BUPIVACAINE-EPINEPHRINE (PF) 0.25% -1:200000 IJ SOLN
INTRAMUSCULAR | Status: DC | PRN
  Administered 2018-08-03: 30 mL

## 2018-08-03 MED ORDER — POLYETHYLENE GLYCOL 3350 17 G PO PACK: 17.0000 g | PACK | Freq: Every day | ORAL | Status: DC | PRN

## 2018-08-03 MED ORDER — MORPHINE SULFATE (PF) 2 MG/ML IV SOLN
0.5000 mg | INTRAVENOUS | Status: DC | PRN
  Administered 2018-08-03 (×2): 0.5 mg via INTRAVENOUS
  Filled 2018-08-03 (×2): qty 1

## 2018-08-03 MED ORDER — OXYCODONE HCL 5 MG/5ML PO SOLN: 5.0000 mg | Freq: Once | ORAL | Status: DC | PRN

## 2018-08-03 MED ORDER — PROPOFOL 10 MG/ML IV BOLUS
INTRAVENOUS | Status: DC | PRN
  Administered 2018-08-03 (×5): 20 mg via INTRAVENOUS

## 2018-08-03 MED ORDER — PROPOFOL 500 MG/50ML IV EMUL
INTRAVENOUS | Status: DC | PRN
  Administered 2018-08-03: 25 ug/kg/min via INTRAVENOUS

## 2018-08-03 MED ORDER — CEFAZOLIN SODIUM-DEXTROSE 2-4 GM/100ML-% IV SOLN
2.0000 g | Freq: Four times a day (QID) | INTRAVENOUS | Status: AC
  Administered 2018-08-03 (×2): 2 g via INTRAVENOUS
  Filled 2018-08-03 (×2): qty 100

## 2018-08-03 MED ORDER — PHENOL 1.4 % MT LIQD
1.0000 | OROMUCOSAL | Status: DC | PRN
  Filled 2018-08-03: qty 177

## 2018-08-03 MED ORDER — ONDANSETRON HCL 4 MG/2ML IJ SOLN
4.0000 mg | Freq: Once | INTRAMUSCULAR | Status: AC | PRN
  Administered 2018-08-03: 4 mg via INTRAVENOUS

## 2018-08-03 MED ORDER — CEFAZOLIN SODIUM-DEXTROSE 2-4 GM/100ML-% IV SOLN
INTRAVENOUS | Status: AC
  Filled 2018-08-03: qty 100

## 2018-08-03 MED ORDER — MIDAZOLAM HCL 5 MG/5ML IJ SOLN
INTRAMUSCULAR | Status: DC | PRN
  Administered 2018-08-03: 2 mg via INTRAVENOUS

## 2018-08-03 MED ORDER — OXYCODONE HCL 5 MG PO TABS: 5.0000 mg | ORAL_TABLET | Freq: Once | ORAL | Status: DC | PRN

## 2018-08-03 MED ORDER — PROPOFOL 10 MG/ML IV BOLUS
INTRAVENOUS | Status: AC
  Filled 2018-08-03: qty 60

## 2018-08-03 MED ORDER — FLEET ENEMA 7-19 GM/118ML RE ENEM: 1.0000 | ENEMA | Freq: Once | RECTAL | Status: DC | PRN

## 2018-08-03 MED ORDER — 0.9 % SODIUM CHLORIDE (POUR BTL) OPTIME
TOPICAL | Status: DC | PRN
  Administered 2018-08-03: 1000 mL

## 2018-08-03 MED ORDER — FENTANYL CITRATE (PF) 100 MCG/2ML IJ SOLN
25.0000 ug | INTRAMUSCULAR | Status: DC | PRN
  Administered 2018-08-03: 50 ug via INTRAVENOUS

## 2018-08-03 MED ORDER — LACTATED RINGERS IV SOLN
INTRAVENOUS | Status: DC
  Administered 2018-08-03 (×2): via INTRAVENOUS

## 2018-08-03 MED ORDER — TRANEXAMIC ACID-NACL 1000-0.7 MG/100ML-% IV SOLN
1000.0000 mg | INTRAVENOUS | Status: AC
  Administered 2018-08-03: 1000 mg via INTRAVENOUS

## 2018-08-03 MED ORDER — FENTANYL CITRATE (PF) 100 MCG/2ML IJ SOLN
INTRAMUSCULAR | Status: AC
  Filled 2018-08-03: qty 2

## 2018-08-03 MED ORDER — ONDANSETRON HCL 4 MG/2ML IJ SOLN
4.0000 mg | Freq: Four times a day (QID) | INTRAMUSCULAR | Status: DC | PRN
  Administered 2018-08-03: 4 mg via INTRAVENOUS
  Filled 2018-08-03: qty 2

## 2018-08-03 MED ORDER — CHLORHEXIDINE GLUCONATE 4 % EX LIQD: 60.0000 mL | Freq: Once | CUTANEOUS | Status: DC

## 2018-08-03 MED ORDER — TRANEXAMIC ACID-NACL 1000-0.7 MG/100ML-% IV SOLN
INTRAVENOUS | Status: AC
  Filled 2018-08-03: qty 100

## 2018-08-03 MED ORDER — POVIDONE-IODINE 10 % EX SWAB
2.0000 "application " | Freq: Once | CUTANEOUS | Status: AC
  Administered 2018-08-03: 2 via TOPICAL

## 2018-08-03 MED ORDER — OXYCODONE HCL 5 MG PO TABS
5.0000 mg | ORAL_TABLET | ORAL | Status: DC | PRN
  Administered 2018-08-03 – 2018-08-05 (×6): 5 mg via ORAL
  Filled 2018-08-03 (×6): qty 1

## 2018-08-03 MED ORDER — TRAMADOL HCL 50 MG PO TABS
50.0000 mg | ORAL_TABLET | Freq: Four times a day (QID) | ORAL | Status: DC | PRN
  Administered 2018-08-03: 50 mg via ORAL
  Administered 2018-08-04: 100 mg via ORAL
  Filled 2018-08-03: qty 2
  Filled 2018-08-03: qty 1

## 2018-08-03 MED ORDER — SODIUM CHLORIDE 0.9 % IV SOLN
INTRAVENOUS | Status: DC
  Administered 2018-08-03 (×2): via INTRAVENOUS

## 2018-08-03 MED ORDER — STERILE WATER FOR IRRIGATION IR SOLN
Status: DC | PRN
  Administered 2018-08-03: 2000 mL

## 2018-08-03 MED ORDER — BISACODYL 10 MG RE SUPP: 10.0000 mg | Freq: Every day | RECTAL | Status: DC | PRN

## 2018-08-03 MED ORDER — VALACYCLOVIR HCL 500 MG PO TABS
1000.0000 mg | ORAL_TABLET | Freq: Every day | ORAL | Status: DC | PRN
  Filled 2018-08-03: qty 2

## 2018-08-03 MED ORDER — DEXAMETHASONE SODIUM PHOSPHATE 10 MG/ML IJ SOLN
8.0000 mg | Freq: Once | INTRAMUSCULAR | Status: AC
  Administered 2018-08-03: 8 mg via INTRAVENOUS

## 2018-08-03 MED ORDER — ACETAMINOPHEN 10 MG/ML IV SOLN
INTRAVENOUS | Status: AC
  Filled 2018-08-03: qty 100

## 2018-08-03 MED ORDER — BUPIVACAINE-EPINEPHRINE (PF) 0.25% -1:200000 IJ SOLN
INTRAMUSCULAR | Status: AC
  Filled 2018-08-03: qty 30

## 2018-08-03 MED ORDER — METOCLOPRAMIDE HCL 5 MG PO TABS: 5.0000 mg | ORAL_TABLET | Freq: Three times a day (TID) | ORAL | Status: DC | PRN

## 2018-08-03 MED ORDER — SODIUM CHLORIDE 0.9 % IV BOLUS
250.0000 mL | Freq: Once | INTRAVENOUS | Status: AC
  Administered 2018-08-03: 250 mL via INTRAVENOUS

## 2018-08-03 MED ORDER — ONDANSETRON HCL 4 MG/2ML IJ SOLN
INTRAMUSCULAR | Status: AC
  Filled 2018-08-03: qty 2

## 2018-08-03 MED ORDER — CEFAZOLIN SODIUM-DEXTROSE 2-4 GM/100ML-% IV SOLN
2.0000 g | INTRAVENOUS | Status: AC
  Administered 2018-08-03: 2 g via INTRAVENOUS

## 2018-08-03 MED ORDER — ONDANSETRON HCL 4 MG/2ML IJ SOLN
INTRAMUSCULAR | Status: DC | PRN
  Administered 2018-08-03: 4 mg via INTRAVENOUS

## 2018-08-03 MED ORDER — METHOCARBAMOL 500 MG PO TABS
500.0000 mg | ORAL_TABLET | Freq: Four times a day (QID) | ORAL | Status: DC | PRN
  Administered 2018-08-03 – 2018-08-05 (×3): 500 mg via ORAL
  Filled 2018-08-03 (×3): qty 1

## 2018-08-03 MED ORDER — DOCUSATE SODIUM 100 MG PO CAPS
100.0000 mg | ORAL_CAPSULE | Freq: Two times a day (BID) | ORAL | Status: DC
  Administered 2018-08-03 – 2018-08-04 (×3): 100 mg via ORAL
  Filled 2018-08-03 (×3): qty 1

## 2018-08-03 MED ORDER — MEPERIDINE HCL 50 MG/ML IJ SOLN: 6.2500 mg | INTRAMUSCULAR | Status: DC | PRN

## 2018-08-03 MED ORDER — BUPIVACAINE IN DEXTROSE 0.75-8.25 % IT SOLN
INTRATHECAL | Status: DC | PRN
  Administered 2018-08-03: 1.7 mL via INTRATHECAL

## 2018-08-03 MED ORDER — ASPIRIN EC 325 MG PO TBEC
325.0000 mg | DELAYED_RELEASE_TABLET | Freq: Two times a day (BID) | ORAL | Status: DC
  Administered 2018-08-04 – 2018-08-05 (×3): 325 mg via ORAL
  Filled 2018-08-03 (×3): qty 1

## 2018-08-03 MED ORDER — ACETAMINOPHEN 10 MG/ML IV SOLN
1000.0000 mg | Freq: Four times a day (QID) | INTRAVENOUS | Status: DC
  Administered 2018-08-03: 1000 mg via INTRAVENOUS

## 2018-08-03 MED ORDER — METOCLOPRAMIDE HCL 5 MG/ML IJ SOLN
5.0000 mg | Freq: Three times a day (TID) | INTRAMUSCULAR | Status: DC | PRN
  Administered 2018-08-03: 10 mg via INTRAVENOUS
  Filled 2018-08-03: qty 2

## 2018-08-03 MED ORDER — MIDAZOLAM HCL 2 MG/2ML IJ SOLN
INTRAMUSCULAR | Status: AC
  Filled 2018-08-03: qty 2

## 2018-08-03 MED ORDER — ONDANSETRON HCL 4 MG PO TABS: 4.0000 mg | ORAL_TABLET | Freq: Four times a day (QID) | ORAL | Status: DC | PRN

## 2018-08-03 SURGICAL SUPPLY — 45 items
APL PRP STRL LF DISP 70% ISPRP (MISCELLANEOUS) ×1
BAG DECANTER FOR FLEXI CONT (MISCELLANEOUS) ×2 IMPLANT
BAG SPEC THK2 15X12 ZIP CLS (MISCELLANEOUS)
BAG ZIPLOCK 12X15 (MISCELLANEOUS) IMPLANT
BLADE SAG 18X100X1.27 (BLADE) ×2 IMPLANT
BLADE SURG SZ10 CARB STEEL (BLADE) ×4 IMPLANT
CHLORAPREP W/TINT 26 (MISCELLANEOUS) ×2 IMPLANT
COVER PERINEAL POST (MISCELLANEOUS) ×2 IMPLANT
COVER SURGICAL LIGHT HANDLE (MISCELLANEOUS) ×2 IMPLANT
COVER WAND RF STERILE (DRAPES) IMPLANT
CUP ACET PINNACLE SECTR 50MM (Hips) IMPLANT
DECANTER SPIKE VIAL GLASS SM (MISCELLANEOUS) ×2 IMPLANT
DRAPE STERI IOBAN 125X83 (DRAPES) ×2 IMPLANT
DRAPE U-SHAPE 47X51 STRL (DRAPES) ×4 IMPLANT
DRSG ADAPTIC 3X8 NADH LF (GAUZE/BANDAGES/DRESSINGS) ×2 IMPLANT
DRSG MEPILEX BORDER 4X4 (GAUZE/BANDAGES/DRESSINGS) ×2 IMPLANT
DRSG MEPILEX BORDER 4X8 (GAUZE/BANDAGES/DRESSINGS) ×2 IMPLANT
ELECT BLADE TIP CTD 4 INCH (ELECTRODE) ×2 IMPLANT
ELECT REM PT RETURN 15FT ADLT (MISCELLANEOUS) ×2 IMPLANT
EVACUATOR 1/8 PVC DRAIN (DRAIN) ×2 IMPLANT
GLOVE BIO SURGEON STRL SZ7 (GLOVE) ×2 IMPLANT
GLOVE BIO SURGEON STRL SZ8 (GLOVE) ×2 IMPLANT
GLOVE BIOGEL PI IND STRL 7.0 (GLOVE) ×1 IMPLANT
GLOVE BIOGEL PI IND STRL 8 (GLOVE) ×1 IMPLANT
GLOVE BIOGEL PI INDICATOR 7.0 (GLOVE) ×1
GLOVE BIOGEL PI INDICATOR 8 (GLOVE) ×1
GOWN STRL REUS W/TWL LRG LVL3 (GOWN DISPOSABLE) ×4 IMPLANT
HEAD FEMORAL 32 CERAMIC (Hips) ×1 IMPLANT
HOLDER FOLEY CATH W/STRAP (MISCELLANEOUS) ×2 IMPLANT
KIT TURNOVER KIT A (KITS) IMPLANT
LINER MARATHON 32 50 (Hips) ×1 IMPLANT
MANIFOLD NEPTUNE II (INSTRUMENTS) ×2 IMPLANT
PACK ANTERIOR HIP CUSTOM (KITS) ×2 IMPLANT
PINNACLE SECTOR CUP 50MM (Hips) ×2 IMPLANT
STEM FEMORAL SZ 5MM STD ACTIS (Stem) ×1 IMPLANT
STRIP CLOSURE SKIN 1/2X4 (GAUZE/BANDAGES/DRESSINGS) ×3 IMPLANT
SUT ETHIBOND NAB CT1 #1 30IN (SUTURE) ×2 IMPLANT
SUT MNCRL AB 4-0 PS2 18 (SUTURE) ×2 IMPLANT
SUT STRATAFIX 0 PDS 27 VIOLET (SUTURE) ×2
SUT VIC AB 2-0 CT1 27 (SUTURE) ×4
SUT VIC AB 2-0 CT1 TAPERPNT 27 (SUTURE) ×2 IMPLANT
SUTURE STRATFX 0 PDS 27 VIOLET (SUTURE) ×1 IMPLANT
SYR 50ML LL SCALE MARK (SYRINGE) IMPLANT
TRAY FOLEY MTR SLVR 16FR STAT (SET/KITS/TRAYS/PACK) ×2 IMPLANT
YANKAUER SUCT BULB TIP 10FT TU (MISCELLANEOUS) ×2 IMPLANT

## 2018-08-03 NOTE — Interval H&P Note (Signed)
History and Physical Interval Note:  08/03/2018 6:12 AM  Sandra Clay  has presented today for surgery, with the diagnosis of left hip osteoarthritis.  The various methods of treatment have been discussed with the patient and family. After consideration of risks, benefits and other options for treatment, the patient has consented to  Procedure(s): TOTAL HIP ARTHROPLASTY ANTERIOR APPROACH (Left) as a surgical intervention.  The patient's history has been reviewed, patient examined, no change in status, stable for surgery.  I have reviewed the patient's chart and labs.  Questions were answered to the patient's satisfaction.     Pilar Plate Vianney Kopecky

## 2018-08-03 NOTE — Anesthesia Procedure Notes (Signed)
Spinal  Patient location during procedure: OR Start time: 08/03/2018 7:14 AM End time: 08/03/2018 7:18 AM Staffing Anesthesiologist: Barnet Glasgow, MD Performed: anesthesiologist  Preanesthetic Checklist Completed: patient identified, site marked, surgical consent, pre-op evaluation, timeout performed, IV checked, risks and benefits discussed and monitors and equipment checked Spinal Block Patient position: sitting Prep: DuraPrep Patient monitoring: heart rate, cardiac monitor, continuous pulse ox and blood pressure Approach: midline Location: L3-4 Injection technique: single-shot Needle Needle type: Sprotte  Needle gauge: 24 G Needle length: 9 cm Needle insertion depth: 6 cm Assessment Sensory level: T4 Additional Notes 2 Attempts pt tolerated procedure well

## 2018-08-03 NOTE — Plan of Care (Signed)
Plan of care 

## 2018-08-03 NOTE — Anesthesia Procedure Notes (Signed)
Procedure Name: MAC Date/Time: 08/03/2018 7:10 AM Performed by: Lollie Sails, CRNA Pre-anesthesia Checklist: Patient identified, Emergency Drugs available, Suction available and Patient being monitored Oxygen Delivery Method: Simple face mask

## 2018-08-03 NOTE — Evaluation (Signed)
Physical Therapy Evaluation Patient Details Name: Sandra Clay MRN: [id number] DOB: 05-20-1955 Today's Date: 08/03/2018   History of Present Illness  L AA-THA  Clinical Impression  Pt is s/p THA resulting in the deficits listed below (see PT Problem List). Min assist supine to sit and sit to stand. Pt took several steps with RW then became nauseous and dizzy. Assisted pt to recliner, BP 94/54 reclined, HR 54, SaO2 98% on room air, RN notified. Good progress expected once nausea resolves.  Pt will benefit from skilled PT to increase their independence and safety with mobility to allow discharge to the venue listed below.      Follow Up Recommendations Supervision for mobility/OOB;Follow surgeon's recommendation for DC plan and follow-up therapies    Equipment Recommendations  3in1 (PT)    Recommendations for Other Services       Precautions / Restrictions Precautions Precautions: Fall Restrictions Weight Bearing Restrictions: No Other Position/Activity Restrictions: WBAT      Mobility  Bed Mobility Overal bed mobility: Needs Assistance Bed Mobility: Supine to Sit     Supine to sit: Min assist;HOB elevated     General bed mobility comments: min A to support LLE  Transfers Overall transfer level: Needs assistance Equipment used: Rolling walker (2 wheeled) Transfers: Sit to/from Stand Sit to Stand: Min assist;From elevated surface         General transfer comment: min A to rise, VCs hand placement  Ambulation/Gait Ambulation/Gait assistance: Min guard;+2 safety/equipment Gait Distance (Feet): 3 Feet Assistive device: Rolling walker (2 wheeled) Gait Pattern/deviations: Step-to pattern;Decreased weight shift to left Gait velocity: decr   General Gait Details: distance limited by dizziness/nausea, assisted to recliner, BP in reclined position 94/54, HR 54, SAO2 98% on room air, RN notified  Stairs            Wheelchair Mobility    Modified Rankin (Stroke  Patients Only)       Balance Overall balance assessment: Modified Independent                                           Pertinent Vitals/Pain Pain Assessment: 0-10 Pain Score: 6  Pain Location: L hip Pain Descriptors / Indicators: Sore Pain Intervention(s): Limited activity within patient's tolerance;Monitored during session;RN gave pain meds during session;Ice applied    Home Living Family/patient expects to be discharged to:: Private residence Living Arrangements: Spouse/significant other Available Help at Discharge: Family;Available 24 hours/day   Home Access: Stairs to enter Entrance Stairs-Rails: None Entrance Stairs-Number of Steps: 2 Home Layout: One level Home Equipment: Walker - 2 wheels;Cane - single point      Prior Function Level of Independence: Independent               Hand Dominance        Extremity/Trunk Assessment   Upper Extremity Assessment Upper Extremity Assessment: Overall WFL for tasks assessed(h/o R shoulder surgery and frozen shoulder)    Lower Extremity Assessment Lower Extremity Assessment: LLE deficits/detail LLE Deficits / Details: hip +2/5, hip AAROM WFL LLE Sensation: WNL    Cervical / Trunk Assessment Cervical / Trunk Assessment: Normal  Communication   Communication: No difficulties  Cognition Arousal/Alertness: Awake/alert Behavior During Therapy: WFL for tasks assessed/performed Overall Cognitive Status: Within Functional Limits for tasks assessed  General Comments      Exercises Total Joint Exercises Ankle Circles/Pumps: AROM;10 reps;Both;Supine Quad Sets: AROM;Left;5 reps;Supine Short Arc Quad: AROM;Left;10 reps;Supine Heel Slides: AAROM;Left;10 reps;Supine Hip ABduction/ADduction: AAROM;Left;10 reps;Supine Long Arc Quad: AROM;Left;5 reps;Seated   Assessment/Plan    PT Assessment Patient needs continued PT services  PT Problem List  Decreased strength;Decreased activity tolerance;Decreased mobility;Decreased knowledge of use of DME;Pain       PT Treatment Interventions DME instruction;Gait training;Stair training;Therapeutic exercise;Therapeutic activities;Functional mobility training;Patient/family education    PT Goals (Current goals can be found in the Care Plan section)  Acute Rehab PT Goals Patient Stated Goal: gardening PT Goal Formulation: With patient Time For Goal Achievement: 08/10/18 Potential to Achieve Goals: Good    Frequency 7X/week   Barriers to discharge        Co-evaluation               AM-PAC PT "6 Clicks" Mobility  Outcome Measure Help needed turning from your back to your side while in a flat bed without using bedrails?: A Little Help needed moving from lying on your back to sitting on the side of a flat bed without using bedrails?: A Little Help needed moving to and from a bed to a chair (including a wheelchair)?: A Little Help needed standing up from a chair using your arms (e.g., wheelchair or bedside chair)?: A Little Help needed to walk in hospital room?: A Lot Help needed climbing 3-5 steps with a railing? : A Lot 6 Click Score: 16    End of Session Equipment Utilized During Treatment: Gait belt Activity Tolerance: Treatment limited secondary to medical complications (Comment)(dizziness/nausea/hypotensive) Patient left: in chair;with call bell/phone within reach;with chair alarm set;with nursing/sitter in room Nurse Communication: Mobility status PT Visit Diagnosis: Difficulty in walking, not elsewhere classified (R26.2);Muscle weakness (generalized) (M62.81);Pain Pain - Right/Left: Left Pain - part of body: Hip    Time: 2595-6387 PT Time Calculation (min) (ACUTE ONLY): 31 min   Charges:   PT Evaluation $PT Eval Low Complexity: 1 Low PT Treatments $Therapeutic Activity: 8-22 mins        Blondell Reveal Kistler PT 08/03/2018  Acute Rehabilitation  Services Pager [phone] Office [phone]

## 2018-08-03 NOTE — Op Note (Signed)
OPERATIVE REPORT- TOTAL HIP ARTHROPLASTY   PREOPERATIVE DIAGNOSIS: Osteoarthritis of the Left hip.   POSTOPERATIVE DIAGNOSIS: Osteoarthritis of the Left  hip.   PROCEDURE: Left total hip arthroplasty, anterior approach.   SURGEON: Gaynelle Arabian, MD   ASSISTANT: Griffith Citron, PA-C  ANESTHESIA:  Spinal  ESTIMATED BLOOD LOSS:-200 mL    DRAINS: Hemovac x1.   COMPLICATIONS: None   CONDITION: PACU - hemodynamically stable.   BRIEF CLINICAL NOTE: Sandra Clay is a 63 y.o. female who has advanced end-  stage arthritis of their Left  hip with progressively worsening pain and  dysfunction.The patient has failed nonoperative management and presents for  total hip arthroplasty.   PROCEDURE IN DETAIL: After successful administration of spinal  anesthetic, the traction boots for the [facility] bed were placed on both  feet and the patient was placed onto the [facility] bed, boots placed into the leg  holders. The Left hip was then isolated from the perineum with plastic  drapes and prepped and draped in the usual sterile fashion. ASIS and  greater trochanter were marked and a oblique incision was made, starting  at about 1 cm lateral and 2 cm distal to the ASIS and coursing towards  the anterior cortex of the femur. The skin was cut with a 10 blade  through subcutaneous tissue to the level of the fascia overlying the  tensor fascia lata muscle. The fascia was then incised in line with the  incision at the junction of the anterior third and posterior 2/3rd. The  muscle was teased off the fascia and then the interval between the TFL  and the rectus was developed. The Hohmann retractor was then placed at  the top of the femoral neck over the capsule. The vessels overlying the  capsule were cauterized and the fat on top of the capsule was removed.  A Hohmann retractor was then placed anterior underneath the rectus  femoris to give exposure to the entire anterior capsule. A T-shaped   capsulotomy was performed. The edges were tagged and the femoral head  was identified.       Osteophytes are removed off the superior acetabulum.  The femoral neck was then cut in situ with an oscillating saw. Traction  was then applied to the left lower extremity utilizing the [facility]  traction. The femoral head was then removed. Retractors were placed  around the acetabulum and then circumferential removal of the labrum was  performed. Osteophytes were also removed. Reaming starts at 47 mm to  medialize and  Increased in 2 mm increments to 49 mm. We reamed in  approximately 40 degrees of abduction, 20 degrees anteversion. A 50 mm  pinnacle acetabular shell was then impacted in anatomic position under  fluoroscopic guidance with excellent purchase. We did not need to place  any additional dome screws. A 32 mm neutral + 4 marathon liner was then  placed into the acetabular shell.       The femoral lift was then placed along the lateral aspect of the femur  just distal to the vastus ridge. The leg was  externally rotated and capsule  was stripped off the inferior aspect of the femoral neck down to the  level of the lesser trochanter, this was done with electrocautery. The femur was lifted after this was performed. The  leg was then placed in an extended and adducted position essentially delivering the femur. We also removed the capsule superiorly and the piriformis from the piriformis  fossa to gain excellent exposure of the  proximal femur. Rongeur was used to remove some cancellous bone to get  into the lateral portion of the proximal femur for placement of the  initial starter reamer. The starter broaches was placed  the starter broach  and was shown to go down the center of the canal. Broaching  with the Actis system was then performed starting at size 0  coursing  Up to size 5. A size 5 had excellent torsional and rotational  and axial stability. The trial standard offset neck was then  placed  with a 32 + 1 trial head. The hip was then reduced. We confirmed that  the stem was in the canal both on AP and lateral x-rays. It also has excellent sizing. The hip was reduced with outstanding stability through full extension and full external rotation.. AP pelvis was taken and the leg lengths were measured and found to be equal. Hip was then dislocated again and the femoral head and neck removed. The  femoral broach was removed. Size 5 Actis stem with a standard offset  neck was then impacted into the femur following native anteversion. Has  excellent purchase in the canal. Excellent torsional and rotational and  axial stability. It is confirmed to be in the canal on AP and lateral  fluoroscopic views. The 32 + 1 ceramic head was placed and the hip  reduced with outstanding stability. Again AP pelvis was taken and it  confirmed that the leg lengths were equal. The wound was then copiously  irrigated with saline solution and the capsule reattached and repaired  with Ethibond suture. 30 ml of .25% Bupivicaine was  injected into the capsule and into the edge of the tensor fascia lata as well as subcutaneous tissue. The fascia overlying the tensor fascia lata was then closed with a running #1 V-Loc. Subcu was closed with interrupted 2-0 Vicryl and subcuticular running 4-0 Monocryl. Incision was cleaned  and dried. Steri-Strips and a bulky sterile dressing applied. Hemovac  drain was hooked to suction and then the patient was awakened and transported to  recovery in stable condition.        Please note that a surgical assistant was a medical necessity for this procedure to perform it in a safe and expeditious manner. Assistant was necessary to provide appropriate retraction of vital neurovascular structures and to prevent femoral fracture and allow for anatomic placement of the prosthesis.  Gaynelle Arabian, M.D.

## 2018-08-03 NOTE — Discharge Instructions (Signed)
°Dr. Frank Aluisio °Total Joint Specialist °[facility] °[address] °[place], [place] [address] °[phone] ° °ANTERIOR APPROACH TOTAL HIP REPLACEMENT POSTOPERATIVE DIRECTIONS ° ° °Hip Rehabilitation, Guidelines Following Surgery  °The results of a hip operation are greatly improved after range of motion and muscle strengthening exercises. Follow all safety measures which are given to protect your hip. If any of these exercises cause increased pain or swelling in your joint, decrease the amount until you are comfortable again. Then slowly increase the exercises. Call your caregiver if you have problems or questions.  ° °HOME CARE INSTRUCTIONS  °• Remove items at home which could result in a fall. This includes throw rugs or furniture in walking pathways.  °· ICE to the affected hip every three hours for 30 minutes at a time and then as needed for pain and swelling.  Continue to use ice on the hip for pain and swelling from surgery. You may notice swelling that will progress down to the foot and ankle.  This is normal after surgery.  Elevate the leg when you are not up walking on it.   °· Continue to use the breathing machine which will help keep your temperature down.  It is common for your temperature to cycle up and down following surgery, especially at night when you are not up moving around and exerting yourself.  The breathing machine keeps your lungs expanded and your temperature down. ° °DIET °You may resume your previous home diet once your are discharged from the hospital. ° °DRESSING / WOUND CARE / SHOWERING °You may change your dressing 3-5 days after surgery.  Then change the dressing every day with sterile gauze.  Please use good hand washing techniques before changing the dressing.  Do not use any lotions or creams on the incision until instructed by your surgeon. °You may start showering once you are discharged home but do not submerge the incision under water. Just pat the  incision dry and apply a dry gauze dressing on daily. °Change the surgical dressing daily and reapply a dry dressing each time. ° °ACTIVITY °Walk with your walker as instructed. °Use walker as long as suggested by your caregivers. °Avoid periods of inactivity such as sitting longer than an hour when not asleep. This helps prevent blood clots.  °You may resume a sexual relationship in one month or when given the OK by your doctor.  °You may return to work once you are cleared by your doctor.  °Do not drive a car for 6 weeks or until released by you surgeon.  °Do not drive while taking narcotics. ° °WEIGHT BEARING °Weight bearing as tolerated with assist device (walker, cane, etc) as directed, use it as long as suggested by your surgeon or therapist, typically at least 4-6 weeks. ° °POSTOPERATIVE CONSTIPATION PROTOCOL °Constipation - defined medically as fewer than three stools per week and severe constipation as less than one stool per week. ° °One of the most common issues patients have following surgery is constipation.  Even if you have a regular bowel pattern at home, your normal regimen is likely to be disrupted due to multiple reasons following surgery.  Combination of anesthesia, postoperative narcotics, change in appetite and fluid intake all can affect your bowels.  In order to avoid complications following surgery, here are some recommendations in order to help you during your recovery period. ° °Colace (docusate) - Pick up an over-the-counter form of Colace or another stool softener and take twice a day   as long as you are requiring postoperative pain medications.  Take with a full glass of water daily.  If you experience loose stools or diarrhea, hold the colace until you stool forms back up.  If your symptoms do not get better within 1 week or if they get worse, check with your doctor. ° °Dulcolax (bisacodyl) - Pick up over-the-counter and take as directed by the product packaging as needed to assist with  the movement of your bowels.  Take with a full glass of water.  Use this product as needed if not relieved by Colace only.  ° °MiraLax (polyethylene glycol) - Pick up over-the-counter to have on hand.  MiraLax is a solution that will increase the amount of water in your bowels to assist with bowel movements.  Take as directed and can mix with a glass of water, juice, soda, coffee, or tea.  Take if you go more than two days without a movement. °Do not use MiraLax more than once per day. Call your doctor if you are still constipated or irregular after using this medication for 7 days in a row. ° °If you continue to have problems with postoperative constipation, please contact the office for further assistance and recommendations.  If you experience "the worst abdominal pain ever" or develop nausea or vomiting, please contact the office immediatly for further recommendations for treatment. ° °ITCHING ° If you experience itching with your medications, try taking only a single pain pill, or even half a pain pill at a time.  You can also use Benadryl over the counter for itching or also to help with sleep.  ° °TED HOSE STOCKINGS °Wear the elastic stockings on both legs for three weeks following surgery during the day but you may remove then at night for sleeping. ° °MEDICATIONS °See your medication summary on the “After Visit Summary” that the nursing staff will review with you prior to discharge.  You may have some home medications which will be placed on hold until you complete the course of blood thinner medication.  It is important for you to complete the blood thinner medication as prescribed by your surgeon.  Continue your approved medications as instructed at time of discharge. ° °PRECAUTIONS °If you experience chest pain or shortness of breath - call 911 immediately for transfer to the hospital emergency department.  °If you develop a fever greater that 101 F, purulent drainage from wound, increased redness or  drainage from wound, foul odor from the wound/dressing, or calf pain - CONTACT YOUR SURGEON.   °                                                °FOLLOW-UP APPOINTMENTS °Make sure you keep all of your appointments after your operation with your surgeon and caregivers. You should call the office at the above phone number and make an appointment for approximately two weeks after the date of your surgery or on the date instructed by your surgeon outlined in the "After Visit Summary". ° °RANGE OF MOTION AND STRENGTHENING EXERCISES  °These exercises are designed to help you keep full movement of your hip joint. Follow your caregiver's or physical therapist's instructions. Perform all exercises about fifteen times, three times per day or as directed. Exercise both hips, even if you have had only one joint replacement. These exercises can be done on   a training (exercise) mat, on the floor, on a table or on a bed. Use whatever works the best and is most comfortable for you. Use music or television while you are exercising so that the exercises are a pleasant break in your day. This will make your life better with the exercises acting as a break in routine you can look forward to.   Lying on your back, slowly slide your foot toward your buttocks, raising your knee up off the floor. Then slowly slide your foot back down until your leg is straight again.   Lying on your back spread your legs as far apart as you can without causing discomfort.   Lying on your side, raise your upper leg and foot straight up from the floor as far as is comfortable. Slowly lower the leg and repeat.   Lying on your back, tighten up the muscle in the front of your thigh (quadriceps muscles). You can do this by keeping your leg straight and trying to raise your heel off the floor. This helps strengthen the largest muscle supporting your knee.   Lying on your back, tighten up the muscles of your buttocks both with the legs straight and with  the knee bent at a comfortable angle while keeping your heel on the floor.   IF YOU ARE TRANSFERRED TO A SKILLED REHAB FACILITY If the patient is transferred to a skilled rehab facility following release from the hospital, a list of the current medications will be sent to the facility for the patient to continue.  When discharged from the skilled rehab facility, please have the facility set up the patient's [place] prior to being released. Also, the skilled facility will be responsible for providing the patient with their medications at time of release from the facility to include their pain medication, the muscle relaxants, and their blood thinner medication. If the patient is still at the rehab facility at time of the two week follow up appointment, the skilled rehab facility will also need to assist the patient in arranging follow up appointment in our office and any transportation needs.  MAKE SURE YOU:   Understand these instructions.   Get help right away if you are not doing well or get worse.    Pick up stool softner and laxative for home use following surgery while on pain medications. Do not submerge incision under water. Please use good hand washing techniques while changing dressing each day. May shower starting three days after surgery. Please use a clean towel to pat the incision dry following showers. Continue to use ice for pain and swelling after surgery. Do not use any lotions or creams on the incision until instructed by your surgeon.

## 2018-08-03 NOTE — Plan of Care (Signed)
  Problem: Activity: Goal: Risk for activity intolerance will decrease Outcome: Progressing   Problem: Pain Managment: Goal: General experience of comfort will improve Outcome: Progressing   Problem: Activity: Goal: Ability to avoid complications of mobility impairment will improve Outcome: Progressing   Problem: Pain Management: Goal: Pain level will decrease with appropriate interventions Outcome: Progressing

## 2018-08-03 NOTE — Transfer of Care (Signed)
Immediate Anesthesia Transfer of Care Note  Patient: Sandra Clay  Procedure(s) Performed: TOTAL HIP ARTHROPLASTY ANTERIOR APPROACH (Left Hip)  Patient Location: PACU  Anesthesia Type:Spinal  Level of Consciousness: awake, drowsy and responds to stimulation  Airway & Oxygen Therapy: Patient Spontanous Breathing and Patient connected to face mask oxygen  Post-op Assessment: Report given to RN and Post -op Vital signs reviewed and stable  Post vital signs: Reviewed and stable  Last Vitals:  Vitals Value Taken Time  BP 101/57 08/03/2018  8:56 AM  Temp    Pulse 77 08/03/2018  8:58 AM  Resp 9 08/03/2018  8:58 AM  SpO2 100 % 08/03/2018  8:58 AM  Vitals shown include unvalidated device data.  Last Pain:  Vitals:   08/03/18 0613  TempSrc:   PainSc: 0-No pain      Patients Stated Pain Goal: 4 (61/25/48 3234)  Complications: No apparent anesthesia complications

## 2018-08-03 NOTE — Anesthesia Procedure Notes (Deleted)
Spinal  Patient location during procedure: OR Preanesthetic Checklist Completed: patient identified, site marked, surgical consent, pre-op evaluation, timeout performed, IV checked, risks and benefits discussed and monitors and equipment checked Spinal Block Patient position: sitting Prep: DuraPrep Patient monitoring: heart rate, cardiac monitor, continuous pulse ox and blood pressure Approach: midline Location: L3-4 Injection technique: single-shot Needle Needle type: Sprotte  Needle gauge: 24 G Needle length: 9 cm Assessment Sensory level: T4

## 2018-08-03 NOTE — Anesthesia Postprocedure Evaluation (Signed)
Anesthesia Post Note  Patient: Sandra Clay  Procedure(s) Performed: TOTAL HIP ARTHROPLASTY ANTERIOR APPROACH (Left Hip)     Patient location during evaluation: Nursing Unit Anesthesia Type: Spinal Level of consciousness: oriented and awake and alert Pain management: pain level controlled Vital Signs Assessment: post-procedure vital signs reviewed and stable Respiratory status: spontaneous breathing and respiratory function stable Cardiovascular status: blood pressure returned to baseline and stable Postop Assessment: no headache, no backache, no apparent nausea or vomiting and patient able to bend at knees Anesthetic complications: no    Last Vitals:  Vitals:   08/03/18 1202 08/03/18 1311  BP: (!) 147/81 129/60  Pulse: 75 64  Resp: 16 18  Temp: 36.4 C   SpO2: 100% 99%    Last Pain:  Vitals:   08/03/18 1219  TempSrc:   PainSc: 7                  Barnet [place]

## 2018-08-04 ENCOUNTER — Encounter (HOSPITAL_COMMUNITY): Payer: Self-pay | Admitting: Orthopedic Surgery

## 2018-08-04 LAB — BASIC METABOLIC PANEL
Anion gap: 8 (ref 5–15)
BUN: 9 mg/dL (ref 8–23)
CO2: 25 mmol/L (ref 22–32)
Calcium: 8.5 mg/dL — ABNORMAL LOW (ref 8.9–10.3)
Chloride: 107 mmol/L (ref 98–111)
Creatinine, Ser: 0.7 mg/dL (ref 0.44–1.00)
GFR calc Af Amer: >60 mL/min (ref >60)
GFR calc non Af Amer: >60 mL/min (ref >60)
Glucose, Bld: 122 mg/dL — ABNORMAL HIGH (ref 70–99)
Potassium: 3.8 mmol/L (ref 3.5–5.1)
Sodium: 140 mmol/L (ref 135–145)

## 2018-08-04 LAB — CBC
HCT: 35.3 % — ABNORMAL LOW (ref 36.0–46.0)
Hemoglobin: 11.6 g/dL — ABNORMAL LOW (ref 12.0–15.0)
MCH: 33.1 pg (ref 26.0–34.0)
MCHC: 32.9 g/dL (ref 30.0–36.0)
MCV: 100.9 fL — ABNORMAL HIGH (ref 80.0–100.0)
Platelets: 198 10*3/uL (ref 150–400)
RBC: 3.5 MIL/uL — ABNORMAL LOW (ref 3.87–5.11)
RDW: 12.6 % (ref 11.5–15.5)
WBC: 10.7 10*3/uL — ABNORMAL HIGH (ref 4.0–10.5)
nRBC: 0 % (ref 0.0–0.2)

## 2018-08-04 MED ORDER — METHOCARBAMOL 500 MG PO TABS: 500.0000 mg | ORAL_TABLET | Freq: Four times a day (QID) | ORAL | 0 refills | Status: DC | PRN

## 2018-08-04 MED ORDER — TRAMADOL HCL 50 MG PO TABS: 50.0000 mg | ORAL_TABLET | Freq: Four times a day (QID) | ORAL | 0 refills | Status: DC | PRN

## 2018-08-04 MED ORDER — ASPIRIN 325 MG PO TBEC: 325.0000 mg | DELAYED_RELEASE_TABLET | Freq: Two times a day (BID) | ORAL | 0 refills | Status: DC

## 2018-08-04 MED ORDER — OXYCODONE HCL 5 MG PO TABS: 5.0000 mg | ORAL_TABLET | ORAL | 0 refills | Status: DC | PRN

## 2018-08-04 MED ORDER — SODIUM CHLORIDE 0.9 % IV BOLUS: 250.0000 mL | Freq: Once | INTRAVENOUS | Status: DC

## 2018-08-04 MED FILL — traMADol HCL 50 MG TABS: 50 | 5 days supply | Qty: 40 | Fill #0

## 2018-08-04 MED FILL — oxyCODONE HCL 5 MG TABS: 5 | 7 days supply | Qty: 42 | Fill #0

## 2018-08-04 MED FILL — ASPIRIN EC 325 MG TABLET: 325 | 20 days supply | Qty: 40 | Fill #0

## 2018-08-04 MED FILL — METHOCARBAMOL 500 MG TABLET: 500 | 10 days supply | Qty: 40 | Fill #0

## 2018-08-04 NOTE — Progress Notes (Signed)
   Subjective: 1 Day Post-Op Procedure(s) (LRB): TOTAL HIP ARTHROPLASTY ANTERIOR APPROACH (Left) Patient reports pain as mild.   Patient seen in rounds by Dr. Wynelle Link. Patient had issues with nausea and dizziness yesterday when attempting to ambulate with physical therapy. She is feeling much better this AM. BP stable. States she is ready to go home. No issues overnight. Denies chest pain or SOB. Foley catheter removed this AM. We will continue therapy today.   Objective: Vital signs in last 24 hours: Temp:  [97.6 F (36.4 C)-98.8 F (37.1 C)] 98 F (36.7 C) (05/19 0544) Pulse Rate:  [64-82] 75 (05/19 0544) Resp:  [8-23] 16 (05/19 0544) BP: (94-150)/(57-87) 141/77 (05/19 0544) SpO2:  [97 %-100 %] 98 % (05/19 0544) Weight:  [79 kg] 79 kg (05/18 1102)  Intake/Output from previous day:  Intake/Output Summary (Last 24 hours) at 08/04/2018 0711 Last data filed at 08/04/2018 0600 Gross per 24 hour  Intake 4922.26 ml  Output 4385 ml  Net 537.26 ml     Intake/Output this shift: No intake/output data recorded.  Labs: Recent Labs    08/04/18 0606  HGB 11.6*   Recent Labs    08/04/18 0606  WBC 10.7*  RBC 3.50*  HCT 35.3*  PLT 198   Recent Labs    08/04/18 0606  NA 140  K 3.8  CL 107  CO2 25  BUN 9  CREATININE 0.70  GLUCOSE 122*  CALCIUM 8.5*   No results for input(s): LABPT, INR in the last 72 hours.  Exam: General - Patient is Alert and Oriented Extremity - Neurologically intact Neurovascular intact Sensation intact distally Dorsiflexion/Plantar flexion intact Dressing - dressing C/D/I Motor Function - intact, moving foot and toes well on exam.   Past Medical History:  Diagnosis Date  . Anxiety   . Cancer ([place])    skin cancer   . Depression    situational , resolved   . Headache(784.0)   . Osteoarthritis   . PONV (postoperative nausea and vomiting)     Assessment/Plan: 1 Day Post-Op Procedure(s) (LRB): TOTAL HIP ARTHROPLASTY ANTERIOR APPROACH  (Left) Principal Problem:   OA (osteoarthritis) of hip  Estimated body mass index is 28.11 kg/m as calculated from the following:   Height as of this encounter: 5\' 6"  (1.676 m).   Weight as of this encounter: 79 kg. Advance diet Up with therapy D/C IV fluids  DVT Prophylaxis - Aspirin Weight bearing as tolerated. D/C O2 and pulse ox and try on room air. Hemovac pulled without difficulty, will continue therapy.  Plan is to go Home after hospital stay. Plan for discharge this afternoon with HEP once meeting goals with physical therapy. Follow-up in the office in 2 weeks.   Theresa Duty, PA-C Orthopedic Surgery 08/04/2018, 7:11 AM

## 2018-08-04 NOTE — TOC Transition Note (Signed)
Transition of Care [facility]) - CM/SW Discharge Note   Patient Details  Name: HALAH WHITESIDE MRN: [id number] Date of Birth: January 25, 1956  [facility] Care [facility] CM/SW Contact:  Leeroy Cha, RN Phone Number: 08/04/2018, 10:16 AM   Clinical Narrative:    Discharge to home see services below  Final next level of care: [place] Barriers to Discharge: No Barriers Identified   Patient Goals and CMS Choice Patient states their goals for this hospitalization and ongoing recovery are:: to go home and start walkiing CMS Medicare.gov Compare Post Acute Care list provided to:: Patient Choice offered to / list presented to : Patient  Discharge Placement                       Discharge Plan and Services   Discharge Planning Services: CM Consult Post Acute Care Choice: Durable Medical Equipment, Home Health          DME Arranged: 3-N-1, Walker rolling DME Agency: [employer] Date DME Agency Contacted: 08/04/18 Time DME Agency Contacted: 0900 Representative spoke with at DME Agency: Ovid Curd HH Arranged: PT [place]: [facility] Date [place]: 08/04/18 Time [place]: 0900 Representative spoke with at [place]: [place] ([place]) Interventions     Readmission Risk Interventions No flowsheet data found.

## 2018-08-04 NOTE — Progress Notes (Signed)
Physical Therapy Treatment Patient Details Name: Sandra Clay MRN: [id number] DOB: [birth year] Today's Date: 08/04/2018    History of Present Illness Pt is a 63 year old female s/p L DA THA    PT Comments    Pt ambulated in hallway and performed sitting and supine LE exercises while reviewing HEP handout.  Pt not yet ready for d/c today.  Plan to practice steps tomorrow prior to d/c.    Follow Up Recommendations  Supervision for mobility/OOB;Follow surgeon's recommendation for DC plan and follow-up therapies     Equipment Recommendations  3in1 (PT)    Recommendations for Other Services       Precautions / Restrictions Precautions Precautions: Fall Restrictions Other Position/Activity Restrictions: WBAT    Mobility  Bed Mobility Overal bed mobility: Needs Assistance Bed Mobility: Supine to Sit     Supine to sit: Min assist;HOB elevated     General bed mobility comments: pt up in recliner on arrival  Transfers Overall transfer level: Needs assistance Equipment used: Rolling walker (2 wheeled) Transfers: Sit to/from Stand Sit to Stand: Min guard         General transfer comment: verbal cues for UE and LE positioning  Ambulation/Gait Ambulation/Gait assistance: Min guard Gait Distance (Feet): 80 Feet Assistive device: Rolling walker (2 wheeled) Gait Pattern/deviations: Step-to pattern;Decreased stance time - left;Antalgic Gait velocity: decr   General Gait Details: verbal cues for sequence, RW positioning, step length, distance to tolerance, pt reports increase to 10/10 L hip with movement however 3/10 at rest   Stairs             Wheelchair Mobility    Modified Rankin (Stroke Patients Only)       Balance                                            Cognition Arousal/Alertness: Awake/alert Behavior During Therapy: WFL for tasks assessed/performed Overall Cognitive Status: Within Functional Limits for tasks assessed                                         Exercises Total Joint Exercises Ankle Circles/Pumps: AROM;10 reps;Both;Supine Quad Sets: AROM;Supine;10 reps;Both Short Arc Quad: AROM;Left;10 reps;Supine Hip ABduction/ADduction: AAROM;Left;10 reps;Supine Long Arc Quad: AROM;Left;Seated;10 reps    General Comments        Pertinent Vitals/Pain Pain Assessment: 0-10 Pain Score: 3  Pain Location: L hip Pain Descriptors / Indicators: Sore;Discomfort Pain Intervention(s): Monitored during session;Premedicated before session;Repositioned    Home Living                      Prior Function            PT Goals (current goals can now be found in the care plan section) Progress towards PT goals: Progressing toward goals    Frequency    7X/week      PT Plan Current plan remains appropriate    Co-evaluation              AM-PAC PT "6 Clicks" Mobility   Outcome Measure  Help needed turning from your back to your side while in a flat bed without using bedrails?: A Little Help needed moving from lying on your back to sitting on the side of a flat  bed without using bedrails?: A Little Help needed moving to and from a bed to a chair (including a wheelchair)?: A Little Help needed standing up from a chair using your arms (e.g., wheelchair or bedside chair)?: A Little Help needed to walk in hospital room?: A Little Help needed climbing 3-5 steps with a railing? : A Lot 6 Click Score: 17    End of Session Equipment Utilized During Treatment: Gait belt Activity Tolerance: Patient tolerated treatment well Patient left: in chair;with call bell/phone within reach   PT Visit Diagnosis: Difficulty in walking, not elsewhere classified (R26.2);Muscle weakness (generalized) (M62.81);Pain Pain - Right/Left: Left Pain - part of body: Hip     Time: 1527-1550 PT Time Calculation (min) (ACUTE ONLY): 23 min  Charges:  $Gait Training: 8-22 mins $Therapeutic Exercise: 8-22  mins                    [name], PT, DPT Acute Rehabilitation Services Office: [phone] Pager: [phone]  [name] 08/04/2018, 4:04 PM

## 2018-08-04 NOTE — Progress Notes (Signed)
Physical Therapy Treatment Patient Details Name: Sandra Clay MRN: [id number] DOB: Nov 13, 1955 Today's Date: 08/04/2018    History of Present Illness Pt is a 63 year old female s/p L DA THA    PT Comments    Pt assisted with ambulating to bathroom however upon exiting bathroom, pt with increased dizziness so assisted safely to recliner.  BP low (see below) and RN in room end of session.  Follow Up Recommendations  Supervision for mobility/OOB;Follow surgeon's recommendation for DC plan and follow-up therapies     Equipment Recommendations  3in1 (PT)    Recommendations for Other Services       Precautions / Restrictions Precautions Precautions: Fall Restrictions Other Position/Activity Restrictions: WBAT    Mobility  Bed Mobility Overal bed mobility: Needs Assistance Bed Mobility: Supine to Sit     Supine to sit: Min assist;HOB elevated     General bed mobility comments: min A to support LLE  Transfers Overall transfer level: Needs assistance Equipment used: Rolling walker (2 wheeled) Transfers: Sit to/from Stand Sit to Stand: Min assist         General transfer comment: verbal cues for UE and LE positioning, slight assist to rise  Ambulation/Gait Ambulation/Gait assistance: Min guard Gait Distance (Feet): 13 Feet(x2) Assistive device: Rolling walker (2 wheeled) Gait Pattern/deviations: Step-to pattern;Decreased stance time - left;Antalgic Gait velocity: decr   General Gait Details: pt requesting using bathroom so ambulated to/from bathroom however increased dizziness upon exiting bathroom so assisted to recliner; BP 72/47 mmHg and HR in 50s; RN in room and aware   Stairs             Wheelchair Mobility    Modified Rankin (Stroke Patients Only)       Balance                                            Cognition Arousal/Alertness: Awake/alert Behavior During Therapy: WFL for tasks assessed/performed Overall Cognitive  Status: Within Functional Limits for tasks assessed                                        Exercises      General Comments        Pertinent Vitals/Pain Pain Assessment: 0-10 Pain Score: 7  Pain Location: L hip Pain Descriptors / Indicators: Sore Pain Intervention(s): Repositioned;Monitored during session;Limited activity within patient's tolerance;Patient requesting pain meds-RN notified    Home Living                      Prior Function            PT Goals (current goals can now be found in the care plan section) Progress towards PT goals: Progressing toward goals    Frequency    7X/week      PT Plan Current plan remains appropriate    Co-evaluation              AM-PAC PT "6 Clicks" Mobility   Outcome Measure  Help needed turning from your back to your side while in a flat bed without using bedrails?: A Little Help needed moving from lying on your back to sitting on the side of a flat bed without using bedrails?: A Little Help needed moving to and  from a bed to a chair (including a wheelchair)?: A Little Help needed standing up from a chair using your arms (e.g., wheelchair or bedside chair)?: A Little Help needed to walk in hospital room?: A Little Help needed climbing 3-5 steps with a railing? : A Lot 6 Click Score: 17    End of Session Equipment Utilized During Treatment: Gait belt Activity Tolerance: Treatment limited secondary to medical complications (Comment) Patient left: in chair;with call bell/phone within reach;with nursing/sitter in room(no chair alarm box however chair pad in place)   PT Visit Diagnosis: Difficulty in walking, not elsewhere classified (R26.2);Muscle weakness (generalized) (M62.81);Pain Pain - Right/Left: Left Pain - part of body: Hip     Time: 1655-3748 PT Time Calculation (min) (ACUTE ONLY): 22 min  Charges:  $Gait Training: 8-22 mins                    [name], PT, [place] Office: [phone] Pager: [phone]   [name] 08/04/2018, 12:51 PM

## 2018-08-05 LAB — CBC
HCT: 34.3 % — ABNORMAL LOW (ref 36.0–46.0)
Hemoglobin: 11.1 g/dL — ABNORMAL LOW (ref 12.0–15.0)
MCH: 32.5 pg (ref 26.0–34.0)
MCHC: 32.4 g/dL (ref 30.0–36.0)
MCV: 100.3 fL — ABNORMAL HIGH (ref 80.0–100.0)
Platelets: 194 10*3/uL (ref 150–400)
RBC: 3.42 MIL/uL — ABNORMAL LOW (ref 3.87–5.11)
RDW: 12.8 % (ref 11.5–15.5)
WBC: 9.7 10*3/uL (ref 4.0–10.5)
nRBC: 0 % (ref 0.0–0.2)

## 2018-08-05 LAB — BASIC METABOLIC PANEL
Anion gap: 10 (ref 5–15)
BUN: 12 mg/dL (ref 8–23)
CO2: 22 mmol/L (ref 22–32)
Calcium: 8.5 mg/dL — ABNORMAL LOW (ref 8.9–10.3)
Chloride: 108 mmol/L (ref 98–111)
Creatinine, Ser: 0.63 mg/dL (ref 0.44–1.00)
GFR calc Af Amer: >60 mL/min (ref >60)
GFR calc non Af Amer: >60 mL/min (ref >60)
Glucose, Bld: 108 mg/dL — ABNORMAL HIGH (ref 70–99)
Potassium: 4 mmol/L (ref 3.5–5.1)
Sodium: 140 mmol/L (ref 135–145)

## 2018-08-05 MED ORDER — ASPIRIN 325 MG PO TBEC: 325.0000 mg | DELAYED_RELEASE_TABLET | Freq: Two times a day (BID) | ORAL | 0 refills | Status: AC

## 2018-08-05 NOTE — Progress Notes (Signed)
   Subjective: 2 Days Post-Op Procedure(s) (LRB): TOTAL HIP ARTHROPLASTY ANTERIOR APPROACH (Left) Patient reports pain as mild.   Patient seen in rounds for Dr. Wynelle Link. Patient is well, and has had no acute complaints or problems. Patient had another episode of lightheadedness and hypotension with first session of physical therapy yesterday. A second 250 mL bolus was ordered with improvement in symptoms. During second session of PT, patient felt much better and blood pressure remained stable. States she is feeling well this AM and is ready for discharge to home. Voiding without difficulty and positive flatus. Denies chest pain or SOB. Plan is to go Home after hospital stay.  Objective: Vital signs in last 24 hours: Temp:  [98.1 F (36.7 C)-98.5 F (36.9 C)] 98.1 F (36.7 C) (05/20 0551) Pulse Rate:  [65-77] 71 (05/20 0551) Resp:  [14-18] 16 (05/20 0551) BP: (72-143)/(47-82) 140/82 (05/20 0551) SpO2:  [98 %-100 %] 98 % (05/20 0551)  Intake/Output from previous day:  Intake/Output Summary (Last 24 hours) at 08/05/2018 0811 Last data filed at 08/05/2018 0600 Gross per 24 hour  Intake 1654.93 ml  Output 2450 ml  Net -795.07 ml    Labs: Recent Labs    08/04/18 0606 08/05/18 0308  HGB 11.6* 11.1*   Recent Labs    08/04/18 0606 08/05/18 0308  WBC 10.7* 9.7  RBC 3.50* 3.42*  HCT 35.3* 34.3*  PLT 198 194   Recent Labs    08/04/18 0606 08/05/18 0308  NA 140 140  K 3.8 4.0  CL 107 108  CO2 25 22  BUN 9 12  CREATININE 0.70 0.63  GLUCOSE 122* 108*  CALCIUM 8.5* 8.5*   Exam: General - Patient is Alert and Oriented Extremity - Neurologically intact Neurovascular intact Sensation intact distally Dorsiflexion/Plantar flexion intact Dressing/Incision - clean, dry, no drainage Motor Function - intact, moving foot and toes well on exam.   Past Medical History:  Diagnosis Date  . Anxiety   . Cancer ([place])    skin cancer   . Depression    situational , resolved   .  Headache(784.0)   . Osteoarthritis   . PONV (postoperative nausea and vomiting)     Assessment/Plan: 2 Days Post-Op Procedure(s) (LRB): TOTAL HIP ARTHROPLASTY ANTERIOR APPROACH (Left) Principal Problem:   OA (osteoarthritis) of hip  Estimated body mass index is 28.11 kg/m as calculated from the following:   Height as of this encounter: 5\' 6"  (1.676 m).   Weight as of this encounter: 79 kg. Up with therapy D/C IV fluids  DVT Prophylaxis - Aspirin Weight-bearing as tolerated  Plan for discharge to home with HEP after one session of physical therapy this AM. Follow-up in the office in 2 weeks.   Theresa Duty, PA-C Orthopedic Surgery 08/05/2018, 8:11 AM

## 2018-08-05 NOTE — Plan of Care (Signed)
  Problem: Clinical Measurements: Goal: Will remain free from infection Outcome: Adequate for Discharge   Problem: Activity: Goal: Risk for activity intolerance will decrease Outcome: Adequate for Discharge   Problem: Nutrition: Goal: Adequate nutrition will be maintained Outcome: Adequate for Discharge   Problem: Elimination: Goal: Will not experience complications related to bowel motility Outcome: Adequate for Discharge Goal: Will not experience complications related to urinary retention Outcome: Adequate for Discharge   Problem: Pain Managment: Goal: General experience of comfort will improve Outcome: Adequate for Discharge   Problem: Education: Goal: Understanding of discharge needs will improve Outcome: Adequate for Discharge Goal: Individualized Educational Video(s) Outcome: Adequate for Discharge   Problem: Activity: Goal: Ability to avoid complications of mobility impairment will improve Outcome: Adequate for Discharge Goal: Ability to tolerate increased activity will improve Outcome: Adequate for Discharge   Problem: Clinical Measurements: Goal: Postoperative complications will be avoided or minimized Outcome: Adequate for Discharge   Problem: Pain Management: Goal: Pain level will decrease with appropriate interventions Outcome: Adequate for Discharge  Home with husband. Discharge teaching done, written information given.

## 2018-08-05 NOTE — Progress Notes (Signed)
Physical Therapy Treatment Patient Details Name: Sandra Clay MRN: [id number] DOB: [birth year] Today's Date: 08/05/2018    History of Present Illness Pt is a 63 year old female s/p L DA THA    PT Comments    Pt ambulated in hallway and practiced safe stair technique.  Pt performed a couple standing exercises and a couple were demonstrated by therapist.  Pt reports understanding of HEP and had no further questions.  Pt eager for d/c home today.   Follow Up Recommendations  Supervision for mobility/OOB;Follow surgeon's recommendation for DC plan and follow-up therapies     Equipment Recommendations  3in1 (PT)    Recommendations for Other Services       Precautions / Restrictions Precautions Precautions: Fall Restrictions Other Position/Activity Restrictions: WBAT    Mobility  Bed Mobility Overal bed mobility: Needs Assistance Bed Mobility: Supine to Sit     Supine to sit: HOB elevated;Min guard     General bed mobility comments: pt self assisted L LE  Transfers Overall transfer level: Needs assistance Equipment used: Rolling walker (2 wheeled) Transfers: Sit to/from Stand Sit to Stand: Min guard         General transfer comment: verbal cues for UE and LE positioning  Ambulation/Gait Ambulation/Gait assistance: Min guard Gait Distance (Feet): 60 Feet Assistive device: Rolling walker (2 wheeled) Gait Pattern/deviations: Step-to pattern;Decreased stance time - left;Antalgic Gait velocity: decr   General Gait Details: verbal cues for sequence, heel strike, step length   Stairs Stairs: Yes Stairs assistance: Min guard Stair Management: Step to pattern;With walker;Backwards Number of Stairs: 2 General stair comments: verbal cues for sequence, RW positioning, safety; pt reports understanding and did not wish to practice again   Wheelchair Mobility    Modified Rankin (Stroke Patients Only)       Balance                                             Cognition Arousal/Alertness: Awake/alert Behavior During Therapy: WFL for tasks assessed/performed Overall Cognitive Status: Within Functional Limits for tasks assessed                                        Exercises Total Joint Exercises Hip ABduction/ADduction: Left;10 reps;Standing;AROM Marching in Standing: AROM;10 reps;Left;Standing    General Comments        Pertinent Vitals/Pain Pain Assessment: 0-10 Pain Score: 5  Pain Location: L hip Pain Descriptors / Indicators: Sore;Discomfort Pain Intervention(s): Monitored during session;Repositioned;Premedicated before session;Limited activity within patient's tolerance    Home Living                      Prior Function            PT Goals (current goals can now be found in the care plan section) Progress towards PT goals: Progressing toward goals    Frequency    7X/week      PT Plan Current plan remains appropriate    Co-evaluation              AM-PAC PT "6 Clicks" Mobility   Outcome Measure  Help needed turning from your back to your side while in a flat bed without using bedrails?: A Little Help needed moving from lying on your back  to sitting on the side of a flat bed without using bedrails?: A Little Help needed moving to and from a bed to a chair (including a wheelchair)?: A Little Help needed standing up from a chair using your arms (e.g., wheelchair or bedside chair)?: A Little Help needed to walk in hospital room?: A Little Help needed climbing 3-5 steps with a railing? : A Little 6 Click Score: 18    End of Session Equipment Utilized During Treatment: Gait belt Activity Tolerance: Patient tolerated treatment well Patient left: in chair;with call bell/phone within reach Nurse Communication: Mobility status PT Visit Diagnosis: Difficulty in walking, not elsewhere classified (R26.2);Muscle weakness (generalized) (M62.81)     Time: 1916-6060 PT Time  Calculation (min) (ACUTE ONLY): 17 min  Charges:  $Gait Training: 8-22 mins                     [name], PT, [place] Office: [phone] Pager: [phone]  [name] 08/05/2018, 12:25 PM

## 2018-08-07 ENCOUNTER — Other Ambulatory Visit: Payer: Self-pay | Admitting: *Deleted

## 2018-08-07 ENCOUNTER — Encounter: Payer: Self-pay | Admitting: *Deleted

## 2018-08-07 NOTE — Patient Outreach (Signed)
[place] [facility]) Care Management  08/07/2018  Sandra Clay 18-Dec-1955 [id number]  Transition of care call   Referral received: Initial outreach: Insurance: [place] [employer]   Subjective: Initial successful telephone call to patient's preferred number in order to complete transition of care assessment; 2 HIPAA identifiers verified. Explained purpose of call and completed transition of care assessment.  Sandra Clay states she is doing well, denies post operative problems, states surgical pain well managed with prescribed medications- Tramadol and oxycodone, tolerating diet, denies bowel or bladder problems. Sandra Clay says she has changed her surgical dressing twice and the incision is unremarkable.  She says she is ambulating without difficulty using a rolling walker. Significant other, Eulas Post, is assisting with her recovery.    Objective:  Sandra Clay was hospitalized at [facility] from 5/18-5/20/2020 for left total hip arthroplasty- anterior approach. Comorbidities include: prediabetes She was discharged to home on 08/05/18 with a home exercise plan and rolling walker.   Assessment:  Patient voices good understanding of all discharge instructions.  See transition of care flowsheet for assessment details.   Plan:  Reviewed hospital discharge diagnosis of left total hip arthroplasty- anterior approach- and treatment plan using hospital discharge instructions, assessing medication adherence and discussing the importance of follow up with surgeon and adherence to home exercise program.  No ongoing care management needs identified so will close case to [place] [facility] care management services and route successful outreach letter with [place] [facility] pamphlet and 24 Hour Nurse Line Magnet to [facility] clinical pool to be mailed to patient's home address.   Barrington Ellison RN,CCM,CDE [place] [facility] Coordinator Office Phone [phone] Office Fax [phone]

## 2018-08-12 NOTE — Discharge Summary (Signed)
Physician Discharge Summary   Patient ID: Sandra Clay MRN: [id number] DOB/AGE: 1955-09-25 63 y.o.  Admit date: 08/03/2018 Discharge date: 08/05/2018  Primary Diagnosis: Osteoarthritis, left hip   Admission Diagnoses:  Past Medical History:  Diagnosis Date  . Anxiety   . Cancer ([place])    skin cancer   . Depression    situational , resolved   . Headache(784.0)   . Osteoarthritis   . PONV (postoperative nausea and vomiting)    Discharge Diagnoses:   Principal Problem:   OA (osteoarthritis) of hip  Estimated body mass index is 28.11 kg/m as calculated from the following:   Height as of this encounter: 5\' 6"  (1.676 m).   Weight as of this encounter: 79 kg.  Procedure:  Procedure(s) (LRB): TOTAL HIP ARTHROPLASTY ANTERIOR APPROACH (Left)   Consults: None  HPI: Sandra Clay is a 63 y.o. female who has advanced end-stage arthritis of their Left  hip with progressively worsening pain and dysfunction.The patient has failed nonoperative management and presents for total hip arthroplasty.    Laboratory Data: Admission on 08/03/2018, Discharged on 08/05/2018  Component Date Value Ref Range Status  . WBC 08/04/2018 10.7* 4.0 - 10.5 K/uL Final  . RBC 08/04/2018 3.50* 3.87 - 5.11 MIL/uL Final  . Hemoglobin 08/04/2018 11.6* 12.0 - 15.0 g/dL Final  . HCT 08/04/2018 35.3* 36.0 - 46.0 % Final  . MCV 08/04/2018 100.9* 80.0 - 100.0 fL Final  . MCH 08/04/2018 33.1  26.0 - 34.0 pg Final  . MCHC 08/04/2018 32.9  30.0 - 36.0 g/dL Final  . RDW 08/04/2018 12.6  11.5 - 15.5 % Final  . Platelets 08/04/2018 198  150 - 400 K/uL Final  . nRBC 08/04/2018 0.0  0.0 - 0.2 % Final   Performed at [facility], [place] [address] [place], [place] [address]  . Sodium 08/04/2018 140  135 - 145 mmol/L Final  . Potassium 08/04/2018 3.8  3.5 - 5.1 mmol/L Final  . Chloride 08/04/2018 107  98 - 111 mmol/L Final  . CO2 08/04/2018 25  22 - 32 mmol/L Final  . Glucose, Bld 08/04/2018 122* 70 - 99  mg/dL Final  . BUN 08/04/2018 9  8 - 23 mg/dL Final  . Creatinine, Ser 08/04/2018 0.70  0.44 - 1.00 mg/dL Final  . Calcium 08/04/2018 8.5* 8.9 - 10.3 mg/dL Final  . GFR calc non Af Amer 08/04/2018 >60  >60 mL/min Final  . GFR calc Af Amer 08/04/2018 >60  >60 mL/min Final  . Anion gap 08/04/2018 8  5 - 15 Final   Performed at [facility], [place] [address], [place], [place] [address]  . WBC 08/05/2018 9.7  4.0 - 10.5 K/uL Final  . RBC 08/05/2018 3.42* 3.87 - 5.11 MIL/uL Final  . Hemoglobin 08/05/2018 11.1* 12.0 - 15.0 g/dL Final  . HCT 08/05/2018 34.3* 36.0 - 46.0 % Final  . MCV 08/05/2018 100.3* 80.0 - 100.0 fL Final  . MCH 08/05/2018 32.5  26.0 - 34.0 pg Final  . MCHC 08/05/2018 32.4  30.0 - 36.0 g/dL Final  . RDW 08/05/2018 12.8  11.5 - 15.5 % Final  . Platelets 08/05/2018 194  150 - 400 K/uL Final  . nRBC 08/05/2018 0.0  0.0 - 0.2 % Final   Performed at [facility], [place] [address], [place], [place] [address]  . Sodium 08/05/2018 140  135 - 145 mmol/L Final  . Potassium 08/05/2018 4.0  3.5 - 5.1 mmol/L Final  .  Chloride 08/05/2018 108  98 - 111 mmol/L Final  . CO2 08/05/2018 22  22 - 32 mmol/L Final  . Glucose, Bld 08/05/2018 108* 70 - 99 mg/dL Final  . BUN 08/05/2018 12  8 - 23 mg/dL Final  . Creatinine, Ser 08/05/2018 0.63  0.44 - 1.00 mg/dL Final  . Calcium 08/05/2018 8.5* 8.9 - 10.3 mg/dL Final  . GFR calc non Af Amer 08/05/2018 >60  >60 mL/min Final  . GFR calc Af Amer 08/05/2018 >60  >60 mL/min Final  . Anion gap 08/05/2018 10  5 - 15 Final   Performed at [facility], [place] [address], [place], [place] [address]  Hospital Outpatient Visit on 07/31/2018  Component Date Value Ref Range Status  . SARS Coronavirus 2 07/31/2018 NEGATIVE  NEGATIVE Final   Comment: (NOTE) If result is NEGATIVE SARS-CoV-2 target nucleic acids are NOT DETECTED. The SARS-CoV-2 RNA is generally detectable in upper and lower  respiratory  specimens during the acute phase of infection. The lowest  concentration of SARS-CoV-2 viral copies this assay can detect is 250  copies / mL. A negative result does not preclude SARS-CoV-2 infection  and should not be used as the sole basis for treatment or other  patient management decisions.  A negative result may occur with  improper specimen collection / handling, submission of specimen other  than nasopharyngeal swab, presence of viral mutation(s) within the  areas targeted by this assay, and inadequate number of viral copies  (<250 copies / mL). A negative result must be combined with clinical  observations, patient history, and epidemiological information. If result is POSITIVE SARS-CoV-2 target nucleic acids are DETECTED. The SARS-CoV-2 RNA is generally detectable in upper and lower  respiratory specimens dur                          ing the acute phase of infection.  Positive  results are indicative of active infection with SARS-CoV-2.  Clinical  correlation with patient history and other diagnostic information is  necessary to determine patient infection status.  Positive results do  not rule out bacterial infection or co-infection with other viruses. If result is PRESUMPTIVE POSTIVE SARS-CoV-2 nucleic acids MAY BE PRESENT.   A presumptive positive result was obtained on the submitted specimen  and confirmed on repeat testing.  While 2019 novel coronavirus  (SARS-CoV-2) nucleic acids may be present in the submitted sample  additional confirmatory testing may be necessary for epidemiological  and / or clinical management purposes  to differentiate between  SARS-CoV-2 and other Sarbecovirus currently known to infect humans.  If clinically indicated additional testing with an alternate test  methodology [phone]) is advised. The SARS-CoV-2 RNA is generally  detectable in upper and lower respiratory sp                          ecimens during the acute  phase of infection. The  expected result is Negative. Fact Sheet for Patients:  StrictlyIdeas.no Fact Sheet for Healthcare Providers: BankingDealers.co.za This test is not yet approved or cleared by the [place] FDA and has been authorized for detection and/or diagnosis of SARS-CoV-2 by FDA under an Emergency Use Authorization (EUA).  This EUA will remain in effect (meaning this test can be used) for the duration of the COVID-19 declaration under Section 564(b)(1) of the Act, 21 U.S.C. section 360bbb-3(b)(1), unless the authorization is terminated or revoked sooner. Performed  at [facility], [place] [address] [place], [place] [address]   Hospital Outpatient Visit on 07/27/2018  Component Date Value Ref Range Status  . MRSA, PCR 07/27/2018 NEGATIVE  NEGATIVE Final  . Staphylococcus aureus 07/27/2018 NEGATIVE  NEGATIVE Final   Comment: (NOTE) The Xpert SA Assay (FDA approved for NASAL specimens in patients 89 years of age and older), is one component of a comprehensive surveillance program. It is not intended to diagnose infection nor to guide or monitor treatment. Performed at [facility], [place] [address] [place], [place] [address]   . aPTT 07/27/2018 29  24 - 36 seconds Final   Performed at [facility], [place] [address], [place], [place] [address]  . WBC 07/27/2018 5.4  4.0 - 10.5 K/uL Final  . RBC 07/27/2018 4.24  3.87 - 5.11 MIL/uL Final  . Hemoglobin 07/27/2018 14.1  12.0 - 15.0 g/dL Final  . HCT 07/27/2018 42.1  36.0 - 46.0 % Final  . MCV 07/27/2018 99.3  80.0 - 100.0 fL Final  . MCH 07/27/2018 33.3  26.0 - 34.0 pg Final  . MCHC 07/27/2018 33.5  30.0 - 36.0 g/dL Final  . RDW 07/27/2018 12.5  11.5 - 15.5 % Final  . Platelets 07/27/2018 231  150 - 400 K/uL Final  . nRBC 07/27/2018 0.0  0.0 - 0.2 % Final   Performed at [facility], [place] [address], [place], [place] [address]  .  Sodium 07/27/2018 138  135 - 145 mmol/L Final  . Potassium 07/27/2018 4.5  3.5 - 5.1 mmol/L Final  . Chloride 07/27/2018 106  98 - 111 mmol/L Final  . CO2 07/27/2018 23  22 - 32 mmol/L Final  . Glucose, Bld 07/27/2018 100* 70 - 99 mg/dL Final  . BUN 07/27/2018 20  8 - 23 mg/dL Final  . Creatinine, Ser 07/27/2018 0.80  0.44 - 1.00 mg/dL Final  . Calcium 07/27/2018 9.2  8.9 - 10.3 mg/dL Final  . Total Protein 07/27/2018 7.6  6.5 - 8.1 g/dL Final  . Albumin 07/27/2018 4.1  3.5 - 5.0 g/dL Final  . AST 07/27/2018 29  15 - 41 U/L Final  . ALT 07/27/2018 25  0 - 44 U/L Final  . Alkaline Phosphatase 07/27/2018 103  38 - 126 U/L Final  . Total Bilirubin 07/27/2018 0.5  0.3 - 1.2 mg/dL Final  . GFR calc non Af Amer 07/27/2018 >60  >60 mL/min Final  . GFR calc Af Amer 07/27/2018 >60  >60 mL/min Final  . Anion gap 07/27/2018 9  5 - 15 Final   Performed at [facility], [place] [address], [place], [place] [address]  . Prothrombin Time 07/27/2018 12.2  11.4 - 15.2 seconds Final  . INR 07/27/2018 0.9  0.8 - 1.2 Final   Comment: (NOTE) INR goal varies based on device and disease states. Performed at [facility], [place] [address], [place], [place] [address]   . ABO/RH(D) 07/27/2018 O POS   Final  . Antibody Screen 07/27/2018 NEG   Final  . Sample Expiration 07/27/2018 08/06/2018,2359   Final  . Extend sample reason 07/27/2018    Final                   Value:NO TRANSFUSIONS OR PREGNANCY IN THE PAST 3 MONTHS Performed at [place] [address], [place], [place] [address]   . ABO/RH(D) 07/27/2018    Final  Value:O POS Performed at [facility], [place] [address], [place], [place] [address]      X-Rays:Dg Pelvis Portable  Result Date: 08/03/2018 CLINICAL DATA:  Postop left hip arthroplasty. EXAM: PORTABLE PELVIS 1-2 VIEWS COMPARISON:  Intraoperative views same date. FINDINGS: 0917 hours. AP view of the  lower pelvis and hips demonstrates no complication related to left total hip arthroplasty. The hardware is well positioned. A surgical drain is in place. There is a small amount of soft tissue emphysema surrounding the left hip. Mild right hip degenerative changes are present. IMPRESSION: Left total hip arthroplasty without demonstrated complication. Electronically Signed   By: Richardean Sale M.D.   On: 08/03/2018 09:38   Dg C-arm 1-60 Min-no Report  Result Date: 08/03/2018 Fluoroscopy was utilized by the requesting physician.  No radiographic interpretation.   Dg Hip Operative Unilat W Or W/o Pelvis Left  Result Date: 08/03/2018 CLINICAL DATA:  Left hip replacement EXAM: OPERATIVE LEFT HIP (WITH PELVIS IF PERFORMED) 2 VIEWS TECHNIQUE: Fluoroscopic spot image(s) were submitted for interpretation post-operatively. COMPARISON:  None. FINDINGS: Intraoperative imaging demonstrates changes of left hip replacement. Normal AP alignment. No visible hardware or bony complicating feature. IMPRESSION: Left hip replacement.  No visible complicating feature. Electronically Signed   By: Rolm Baptise M.D.   On: 08/03/2018 08:49    EKG: Orders placed or performed in visit on 05/18/18  . EKG 12-Lead     Hospital Course: WYNEMA GAROUTTE is a 63 y.o. who was admitted to [facility]. They were brought to the operating room on 08/03/2018 and underwent Procedure(s): [place].  Patient tolerated the procedure well and [place] is a 63 y.o. female who has advanced end-  stage arthritis of their Left  hip with progressively worsening pain and  dysfunction.The patient has failed nonoperative management and presents for  total hip arthroplasty.  d to the recovery room and then to the orthopaedic floor for postoperative care. They were given PO and IV analgesics for pain control following their surgery. They were given 24 hours of postoperative antibiotics of   Anti-infectives (From admission, onward)   Start     Dose/Rate Route Frequency Ordered Stop   08/03/18 1400  ceFAZolin (ANCEF) IVPB 2g/100 mL premix     2 g 200 mL/hr over 30 Minutes Intravenous Every 6 hours 08/03/18 1056 08/03/18 2026   08/03/18 1055  valACYclovir (VALTREX) tablet 1,000 mg  Status:  Discontinued     1,000 mg Oral Daily PRN 08/03/18 1056 08/05/18 1640   08/03/18 0647  ceFAZolin (ANCEF) 2-4 GM/100ML-% IVPB    Note to Pharmacy:  Alfonso Patten   : cabinet override      08/03/18 0647 08/03/18 0715   08/03/18 0615  ceFAZolin (ANCEF) IVPB 2g/100 mL premix     2 g 200 mL/hr over 30 Minutes Intravenous On call to O.R. 08/03/18 0601 08/03/18 0715     and started on DVT prophylaxis in the form of Aspirin.   PT and OT were ordered for total joint protocol. Discharge planning consulted to help with postop disposition and equipment needs. Patient had a decent night on the evening of surgery. Had issues with nausea and dizziness when attempting to ambulate with physical therapy. 250 mL bolus ordered. Hemovac drain was pulled without difficulty on day one. Continued to work with therapy into POD #2. She had an additional episode of lightheadedness on POD #1 while ambulating, another 250 mL bolus was  ordered with improvement. BP remained stable throughout the day. Pt was seen during rounds on day two and was ready to go home pending progress with therapy. Stated she was feeling well and was ready for discharge. Dressing was changed and the incision was clean, dry, and intact with no drainage. Pt worked with therapy for one additional session and was meeting their goals. She was discharged to home later that day in stable condition.  Diet: Regular diet Activity: WBAT Follow-up: in 2 weeks Disposition: Home with HEP Discharged Condition: stable   Discharge Instructions    Call MD / Call 911   Complete by:  As directed    If you experience chest pain or shortness of breath, CALL 911 and be  transported to the hospital emergency room.  If you develope a fever above 101 F, pus (white drainage) or increased drainage or redness at the wound, or calf pain, call your surgeon's office.   Change dressing   Complete by:  As directed    You may change your dressing on Wednesday, then change the dressing daily with sterile 4 x 4 inch gauze dressing and paper tape.   Constipation Prevention   Complete by:  As directed    Drink plenty of fluids.  Prune juice may be helpful.  You may use a stool softener, such as Colace (over the counter) 100 mg twice a day.  Use MiraLax (over the counter) for constipation as needed.   Diet - low sodium heart healthy   Complete by:  As directed    Discharge instructions   Complete by:  As directed    ? Dr. Gaynelle Arabian Total Joint Specialist [facility] [address], [place], [place] [address] [phone]  ANTERIOR APPROACH TOTAL HIP REPLACEMENT POSTOPERATIVE DIRECTIONS   Hip Rehabilitation, Guidelines Following Surgery  The results of a hip operation are greatly improved after range of motion and muscle strengthening exercises. Follow all safety measures which are given to protect your hip. If any of these exercises cause increased pain or swelling in your joint, decrease the amount until you are comfortable again. Then slowly increase the exercises. Call your caregiver if you have problems or questions.   HOME CARE INSTRUCTIONS  Remove items at home which could result in a fall. This includes throw rugs or furniture in walking pathways.  ICE to the affected hip every three hours for 30 minutes at a time and then as needed for pain and swelling.  Continue to use ice on the hip for pain and swelling from surgery. You may notice swelling that will progress down to the foot and ankle.  This is normal after surgery.  Elevate the leg when you are not up walking on it.   Continue to use the breathing machine which will help keep your  temperature down.  It is common for your temperature to cycle up and down following surgery, especially at night when you are not up moving around and exerting yourself.  The breathing machine keeps your lungs expanded and your temperature down.  DIET You may resume your previous home diet once your are discharged from the hospital.  DRESSING / WOUND CARE / SHOWERING You may change your dressing 3-5 days after surgery.  Then change the dressing every day with sterile gauze.  Please use good hand washing techniques before changing the dressing.  Do not use any lotions or creams on the incision until instructed by your surgeon. You may start showering once  you are discharged home but do not submerge the incision under water. Just pat the incision dry and apply a dry gauze dressing on daily. Change the surgical dressing daily and reapply a dry dressing each time.  ACTIVITY Walk with your walker as instructed. Use walker as long as suggested by your caregivers. Avoid periods of inactivity such as sitting longer than an hour when not asleep. This helps prevent blood clots.  You may resume a sexual relationship in one month or when given the OK by your doctor.  You may return to work once you are cleared by your doctor.  Do not drive a car for 6 weeks or until released by you surgeon.  Do not drive while taking narcotics.  WEIGHT BEARING Weight bearing as tolerated with assist device (walker, cane, etc) as directed, use it as long as suggested by your surgeon or therapist, typically at least 4-6 weeks.  POSTOPERATIVE CONSTIPATION PROTOCOL Constipation - defined medically as fewer than three stools per week and severe constipation as less than one stool per week.  One of the most common issues patients have following surgery is constipation.  Even if you have a regular bowel pattern at home, your normal regimen is likely to be disrupted due to multiple reasons following surgery.  Combination of  anesthesia, postoperative narcotics, change in appetite and fluid intake all can affect your bowels.  In order to avoid complications following surgery, here are some recommendations in order to help you during your recovery period.  Colace (docusate) - Pick up an over-the-counter form of Colace or another stool softener and take twice a day as long as you are requiring postoperative pain medications.  Take with a full glass of water daily.  If you experience loose stools or diarrhea, hold the colace until you stool forms back up.  If your symptoms do not get better within 1 week or if they get worse, check with your doctor.  Dulcolax (bisacodyl) - Pick up over-the-counter and take as directed by the product packaging as needed to assist with the movement of your bowels.  Take with a full glass of water.  Use this product as needed if not relieved by Colace only.   MiraLax (polyethylene glycol) - Pick up over-the-counter to have on hand.  MiraLax is a solution that will increase the amount of water in your bowels to assist with bowel movements.  Take as directed and can mix with a glass of water, juice, soda, coffee, or tea.  Take if you go more than two days without a movement. Do not use MiraLax more than once per day. Call your doctor if you are still constipated or irregular after using this medication for 7 days in a row.  If you continue to have problems with postoperative constipation, please contact the office for further assistance and recommendations.  If you experience "the worst abdominal pain ever" or develop nausea or vomiting, please contact the office immediatly for further recommendations for treatment.  ITCHING  If you experience itching with your medications, try taking only a single pain pill, or even half a pain pill at a time.  You can also use Benadryl over the counter for itching or also to help with sleep.   TED HOSE STOCKINGS Wear the elastic stockings on both legs for three  weeks following surgery during the day but you may remove then at night for sleeping.  MEDICATIONS See your medication summary on the "After Visit Summary" that the nursing  staff will review with you prior to discharge.  You may have some home medications which will be placed on hold until you complete the course of blood thinner medication.  It is important for you to complete the blood thinner medication as prescribed by your surgeon.  Continue your approved medications as instructed at time of discharge.  PRECAUTIONS If you experience chest pain or shortness of breath - call 911 immediately for transfer to the hospital emergency department.  If you develop a fever greater that 101 F, purulent drainage from wound, increased redness or drainage from wound, foul odor from the wound/dressing, or calf pain - CONTACT YOUR SURGEON.                                                   FOLLOW-UP APPOINTMENTS Make sure you keep all of your appointments after your operation with your surgeon and caregivers. You should call the office at the above phone number and make an appointment for approximately two weeks after the date of your surgery or on the date instructed by your surgeon outlined in the "After Visit Summary".  RANGE OF MOTION AND STRENGTHENING EXERCISES  These exercises are designed to help you keep full movement of your hip joint. Follow your caregiver's or physical therapist's instructions. Perform all exercises about fifteen times, three times per day or as directed. Exercise both hips, even if you have had only one joint replacement. These exercises can be done on a training (exercise) mat, on the floor, on a table or on a bed. Use whatever works the best and is most comfortable for you. Use music or television while you are exercising so that the exercises are a pleasant break in your day. This will make your life better with the exercises acting as a break in routine you can look forward to.   Lying on your back, slowly slide your foot toward your buttocks, raising your knee up off the floor. Then slowly slide your foot back down until your leg is straight again.  Lying on your back spread your legs as far apart as you can without causing discomfort.  Lying on your side, raise your upper leg and foot straight up from the floor as far as is comfortable. Slowly lower the leg and repeat.  Lying on your back, tighten up the muscle in the front of your thigh (quadriceps muscles). You can do this by keeping your leg straight and trying to raise your heel off the floor. This helps strengthen the largest muscle supporting your knee.  Lying on your back, tighten up the muscles of your buttocks both with the legs straight and with the knee bent at a comfortable angle while keeping your heel on the floor.   IF YOU ARE TRANSFERRED TO A SKILLED REHAB FACILITY If the patient is transferred to a skilled rehab facility following release from the hospital, a list of the current medications will be sent to the facility for the patient to continue.  When discharged from the skilled rehab facility, please have the facility set up the patient's [place] prior to being released. Also, the skilled facility will be responsible for providing the patient with their medications at time of release from the facility to include their pain medication, the muscle relaxants, and their blood thinner medication. If the patient  is still at the rehab facility at time of the two week follow up appointment, the skilled rehab facility will also need to assist the patient in arranging follow up appointment in our office and any transportation needs.  MAKE SURE YOU:  Understand these instructions.  Get help right away if you are not doing well or get worse.    Pick up stool softner and laxative for home use following surgery while on pain medications. Do not submerge incision under water. Please use good hand  washing techniques while changing dressing each day. May shower starting three days after surgery. Please use a clean towel to pat the incision dry following showers. Continue to use ice for pain and swelling after surgery. Do not use any lotions or creams on the incision until instructed by your surgeon.   Do not sit on low chairs, stoools or toilet seats, as it may be difficult to get up from low surfaces   Complete by:  As directed    Driving restrictions   Complete by:  As directed    No driving for two weeks   TED hose   Complete by:  As directed    Use stockings (TED hose) for three weeks on both leg(s).  You may remove them at night for sleeping.   Weight bearing as tolerated   Complete by:  As directed      Allergies as of 08/05/2018      Reactions   Dilaudid [hydromorphone] Itching   Sulfa Antibiotics Rash      Medication List    TAKE these medications   aspirin 325 MG EC tablet Take 1 tablet (325 mg total) by mouth 2 (two) times daily for 20 days. Then take one 81 mg aspirin once a day for three weeks.   methocarbamol 500 MG tablet Commonly known as:  ROBAXIN Take 1 tablet (500 mg total) by mouth every 6 (six) hours as needed for muscle spasms.   OVER THE COUNTER MEDICATION Take 1 capsule by mouth at bedtime. CBD   oxyCODONE 5 MG immediate release tablet Commonly known as:  Oxy IR/ROXICODONE Take 1 tablet (5 mg total) by mouth every 4 (four) hours as needed for severe pain.   traMADol 50 MG tablet Commonly known as:  ULTRAM Take 1-2 tablets (50-100 mg total) by mouth every 6 (six) hours as needed for moderate pain.   valACYclovir 1000 MG tablet Commonly known as:  VALTREX Take 1 g by mouth daily as needed (fever blisters).            Discharge Care Instructions  (From admission, onward)         Start     Ordered   08/04/18 0000  Weight bearing as tolerated     08/04/18 0717   08/04/18 0000  Change dressing    Comments:  You may change your  dressing on Wednesday, then change the dressing daily with sterile 4 x 4 inch gauze dressing and paper tape.   08/04/18 1324         Follow-up Information    Gaynelle Arabian, MD. Schedule an appointment as soon as possible for a visit on 08/18/2018.   Specialty:  Orthopedic Surgery Contact information: [address] [place] [address] [phone]           Signed: Theresa Duty, PA-C Orthopedic Surgery 08/12/2018, 8:33 AM

## 2018-08-18 DIAGNOSIS — Z96649 Presence of unspecified artificial hip joint: Secondary | ICD-10-CM | POA: Insufficient documentation

## 2018-08-18 MED FILL — GABAPENTIN 300 MG CAPSULE: 300 | 30 days supply | Qty: 30 | Fill #0

## 2018-09-08 DIAGNOSIS — Z96642 Presence of left artificial hip joint: Secondary | ICD-10-CM | POA: Diagnosis not present

## 2018-09-08 DIAGNOSIS — Z471 Aftercare following joint replacement surgery: Secondary | ICD-10-CM | POA: Diagnosis not present

## 2018-09-09 MED FILL — GABAPENTIN 300 MG CAPSULE: 300 | 30 days supply | Qty: 60 | Fill #0

## 2018-09-10 MED FILL — valACYclovir HCL 1 GM TABS: 1 | 90 days supply | Qty: 90 | Fill #7

## 2018-09-12 DIAGNOSIS — Z0100 Encounter for examination of eyes and vision without abnormal findings: Secondary | ICD-10-CM | POA: Diagnosis not present

## 2018-09-22 ENCOUNTER — Telehealth: Payer: Self-pay | Admitting: *Deleted

## 2018-09-22 NOTE — Telephone Encounter (Signed)
[employer] Employee requesting [phone] testing before she leaves for vacation. She is looking for a quicker turn around time than 4-5 days. She will call back if she decides to schedule.

## 2018-10-20 DIAGNOSIS — Z96642 Presence of left artificial hip joint: Secondary | ICD-10-CM | POA: Diagnosis not present

## 2018-10-20 DIAGNOSIS — M25561 Pain in right knee: Secondary | ICD-10-CM | POA: Diagnosis not present

## 2018-10-20 DIAGNOSIS — Z471 Aftercare following joint replacement surgery: Secondary | ICD-10-CM | POA: Diagnosis not present

## 2018-10-28 DIAGNOSIS — M25561 Pain in right knee: Secondary | ICD-10-CM | POA: Diagnosis not present

## 2018-11-05 DIAGNOSIS — M25561 Pain in right knee: Secondary | ICD-10-CM | POA: Diagnosis not present

## 2018-11-05 DIAGNOSIS — S83281D Other tear of lateral meniscus, current injury, right knee, subsequent encounter: Secondary | ICD-10-CM | POA: Diagnosis not present

## 2018-12-07 DIAGNOSIS — Z6828 Body mass index (BMI) 28.0-28.9, adult: Secondary | ICD-10-CM | POA: Diagnosis not present

## 2018-12-07 DIAGNOSIS — Z01419 Encounter for gynecological examination (general) (routine) without abnormal findings: Secondary | ICD-10-CM | POA: Diagnosis not present

## 2018-12-22 MED FILL — valACYclovir HCL 1 GM TABS: 1 | 90 days supply | Qty: 90 | Fill #0

## 2019-02-02 DIAGNOSIS — M23341 Other meniscus derangements, anterior horn of lateral meniscus, right knee: Secondary | ICD-10-CM | POA: Diagnosis not present

## 2019-02-02 DIAGNOSIS — M23221 Derangement of posterior horn of medial meniscus due to old tear or injury, right knee: Secondary | ICD-10-CM | POA: Diagnosis not present

## 2019-02-02 MED FILL — HYDROCODON-APAP 5-325: 5-325 | 7 days supply | Qty: 30 | Fill #0

## 2019-02-02 MED FILL — METHOCARBAMOL 500 MG TABLET: 500 | 10 days supply | Qty: 30 | Fill #0

## 2019-03-03 ENCOUNTER — Ambulatory Visit: Payer: 59 | Attending: Internal Medicine

## 2019-03-03 ENCOUNTER — Other Ambulatory Visit: Payer: Self-pay

## 2019-03-03 DIAGNOSIS — Z20822 Contact with and (suspected) exposure to covid-19: Secondary | ICD-10-CM

## 2019-03-03 DIAGNOSIS — Z20828 Contact with and (suspected) exposure to other viral communicable diseases: Secondary | ICD-10-CM | POA: Diagnosis not present

## 2019-03-05 LAB — NOVEL CORONAVIRUS, NAA: SARS-CoV-2, NAA: NOT DETECTED

## 2019-03-22 ENCOUNTER — Ambulatory Visit: Payer: 59 | Attending: Internal Medicine

## 2019-03-22 DIAGNOSIS — Z20822 Contact with and (suspected) exposure to covid-19: Secondary | ICD-10-CM | POA: Diagnosis not present

## 2019-03-23 ENCOUNTER — Other Ambulatory Visit: Payer: Self-pay

## 2019-03-23 ENCOUNTER — Ambulatory Visit (HOSPITAL_COMMUNITY)
Admission: RE | Admit: 2019-03-23 | Discharge: 2019-03-23 | Disposition: A | Discharge status: Home / Self Care | Payer: 59 | Source: Ambulatory Visit | Attending: Cardiology | Admitting: Cardiology

## 2019-03-23 ENCOUNTER — Other Ambulatory Visit (HOSPITAL_COMMUNITY): Payer: Self-pay | Admitting: Orthopedic Surgery

## 2019-03-23 DIAGNOSIS — M79605 Pain in left leg: Secondary | ICD-10-CM | POA: Diagnosis not present

## 2019-03-23 DIAGNOSIS — S83241D Other tear of medial meniscus, current injury, right knee, subsequent encounter: Secondary | ICD-10-CM | POA: Diagnosis not present

## 2019-03-23 DIAGNOSIS — Z5189 Encounter for other specified aftercare: Secondary | ICD-10-CM | POA: Diagnosis not present

## 2019-03-23 DIAGNOSIS — M79661 Pain in right lower leg: Secondary | ICD-10-CM | POA: Diagnosis not present

## 2019-03-23 DIAGNOSIS — M7989 Other specified soft tissue disorders: Secondary | ICD-10-CM | POA: Diagnosis not present

## 2019-03-23 DIAGNOSIS — M79604 Pain in right leg: Secondary | ICD-10-CM | POA: Diagnosis not present

## 2019-03-23 DIAGNOSIS — M79662 Pain in left lower leg: Secondary | ICD-10-CM | POA: Diagnosis not present

## 2019-03-23 DIAGNOSIS — S83281D Other tear of lateral meniscus, current injury, right knee, subsequent encounter: Secondary | ICD-10-CM | POA: Diagnosis not present

## 2019-03-24 LAB — NOVEL CORONAVIRUS, NAA: SARS-CoV-2, NAA: NOT DETECTED

## 2019-04-01 MED FILL — valACYclovir HCL 1 GM TABS: 1 | 90 days supply | Qty: 90 | Fill #1

## 2019-04-12 ENCOUNTER — Other Ambulatory Visit: Payer: Self-pay | Admitting: Obstetrics & Gynecology

## 2019-04-12 DIAGNOSIS — Z1231 Encounter for screening mammogram for malignant neoplasm of breast: Secondary | ICD-10-CM

## 2019-05-24 DIAGNOSIS — Z0100 Encounter for examination of eyes and vision without abnormal findings: Secondary | ICD-10-CM | POA: Diagnosis not present

## 2019-05-26 ENCOUNTER — Ambulatory Visit
Admission: RE | Admit: 2019-05-26 | Discharge: 2019-05-26 | Disposition: A | Discharge status: Home / Self Care | Payer: 59 | Source: Ambulatory Visit | Attending: Obstetrics & Gynecology | Admitting: Obstetrics & Gynecology

## 2019-05-26 ENCOUNTER — Other Ambulatory Visit: Payer: Self-pay

## 2019-05-26 DIAGNOSIS — D2271 Melanocytic nevi of right lower limb, including hip: Secondary | ICD-10-CM | POA: Diagnosis not present

## 2019-05-26 DIAGNOSIS — D224 Melanocytic nevi of scalp and neck: Secondary | ICD-10-CM | POA: Diagnosis not present

## 2019-05-26 DIAGNOSIS — D1801 Hemangioma of skin and subcutaneous tissue: Secondary | ICD-10-CM | POA: Diagnosis not present

## 2019-05-26 DIAGNOSIS — L918 Other hypertrophic disorders of the skin: Secondary | ICD-10-CM | POA: Diagnosis not present

## 2019-05-26 DIAGNOSIS — L814 Other melanin hyperpigmentation: Secondary | ICD-10-CM | POA: Diagnosis not present

## 2019-05-26 DIAGNOSIS — D2261 Melanocytic nevi of right upper limb, including shoulder: Secondary | ICD-10-CM | POA: Diagnosis not present

## 2019-05-26 DIAGNOSIS — L819 Disorder of pigmentation, unspecified: Secondary | ICD-10-CM | POA: Diagnosis not present

## 2019-05-26 DIAGNOSIS — D485 Neoplasm of uncertain behavior of skin: Secondary | ICD-10-CM | POA: Diagnosis not present

## 2019-05-26 DIAGNOSIS — Z1231 Encounter for screening mammogram for malignant neoplasm of breast: Secondary | ICD-10-CM

## 2019-05-26 DIAGNOSIS — L821 Other seborrheic keratosis: Secondary | ICD-10-CM | POA: Diagnosis not present

## 2019-06-03 DIAGNOSIS — M25561 Pain in right knee: Secondary | ICD-10-CM | POA: Diagnosis not present

## 2019-06-03 DIAGNOSIS — Z5189 Encounter for other specified aftercare: Secondary | ICD-10-CM | POA: Diagnosis not present

## 2019-06-03 DIAGNOSIS — S83241D Other tear of medial meniscus, current injury, right knee, subsequent encounter: Secondary | ICD-10-CM | POA: Diagnosis not present

## 2019-06-03 DIAGNOSIS — S83281D Other tear of lateral meniscus, current injury, right knee, subsequent encounter: Secondary | ICD-10-CM | POA: Diagnosis not present

## 2019-06-11 ENCOUNTER — Encounter: Payer: Self-pay | Admitting: Gastroenterology

## 2019-06-14 DIAGNOSIS — S83241D Other tear of medial meniscus, current injury, right knee, subsequent encounter: Secondary | ICD-10-CM | POA: Diagnosis not present

## 2019-06-24 DIAGNOSIS — M25561 Pain in right knee: Secondary | ICD-10-CM | POA: Diagnosis not present

## 2019-07-09 MED FILL — valACYclovir HCL 1 GM TABS: 1 | 90 days supply | Qty: 90 | Fill #0

## 2019-07-20 ENCOUNTER — Other Ambulatory Visit: Payer: Self-pay

## 2019-07-20 ENCOUNTER — Ambulatory Visit (AMBULATORY_SURGERY_CENTER): Payer: Self-pay | Admitting: *Deleted

## 2019-07-20 VITALS — Temp 96.6°F | Ht 66.0 in | Wt 172.0 lb

## 2019-07-20 DIAGNOSIS — Z1211 Encounter for screening for malignant neoplasm of colon: Secondary | ICD-10-CM

## 2019-07-20 MED ORDER — SUPREP BOWEL PREP KIT 17.5-3.13-1.6 GM/177ML PO SOLN: 1.0000 | Freq: Once | ORAL | 0 refills | Status: AC

## 2019-07-20 MED FILL — SUPREP BOWEL PREP KIT: 17.5-3.13-1 | 1 days supply | Qty: 354 | Fill #0

## 2019-07-20 NOTE — Progress Notes (Signed)
No egg or soy allergy known to patient  No issues with past sedation with any surgeries  or procedures, no intubation problems  No diet pills per patient No home 02 use per patient  No blood thinners per patient  Pt denies issues with constipation  No A fib or A flutter  EMMI video sent to pt's e mail   05-02-2019 completed covid vaccines   Due to the COVID-19 pandemic we are asking patients to follow these guidelines. Please only bring one care partner. Please be aware that your care partner may wait in the car in the parking lot or if they feel like they will be too hot to wait in the car, they may wait in the lobby on the 4th floor. All care partners are required to wear a mask the entire time (we do not have any that we can provide them), they need to practice social distancing, and we will do a Covid check for all patient's and care partners when you arrive. Also we will check their temperature and your temperature. If the care partner waits in their car they need to stay in the parking lot the entire time and we will call them on their cell phone when the patient is ready for discharge so they can bring the car to the front of the building. Also all patient's will need to wear a mask into building.

## 2019-07-21 DIAGNOSIS — Z96642 Presence of left artificial hip joint: Secondary | ICD-10-CM | POA: Diagnosis not present

## 2019-07-21 DIAGNOSIS — M7062 Trochanteric bursitis, left hip: Secondary | ICD-10-CM | POA: Diagnosis not present

## 2019-07-29 ENCOUNTER — Encounter: Payer: Self-pay | Admitting: Gastroenterology

## 2019-08-02 ENCOUNTER — Encounter: Payer: 59 | Admitting: Family Medicine

## 2019-08-03 ENCOUNTER — Other Ambulatory Visit: Payer: Self-pay

## 2019-08-03 ENCOUNTER — Encounter: Payer: Self-pay | Admitting: Gastroenterology

## 2019-08-03 ENCOUNTER — Ambulatory Visit (AMBULATORY_SURGERY_CENTER): Payer: 59 | Admitting: Gastroenterology

## 2019-08-03 VITALS — BP 133/74 | HR 59 | Temp 97.3°F | Resp 16 | Ht 66.0 in | Wt 285.0 lb

## 2019-08-03 DIAGNOSIS — Z800 Family history of malignant neoplasm of digestive organs: Secondary | ICD-10-CM | POA: Diagnosis not present

## 2019-08-03 DIAGNOSIS — E7800 Pure hypercholesterolemia, unspecified: Secondary | ICD-10-CM | POA: Diagnosis not present

## 2019-08-03 DIAGNOSIS — Z1211 Encounter for screening for malignant neoplasm of colon: Secondary | ICD-10-CM | POA: Diagnosis not present

## 2019-08-03 DIAGNOSIS — D128 Benign neoplasm of rectum: Secondary | ICD-10-CM

## 2019-08-03 DIAGNOSIS — K621 Rectal polyp: Secondary | ICD-10-CM | POA: Diagnosis not present

## 2019-08-03 MED ORDER — SODIUM CHLORIDE 0.9 % IV SOLN: 500.0000 mL | INTRAVENOUS | Status: DC

## 2019-08-03 NOTE — Op Note (Signed)
[facility] Patient Name: Sandra Clay Procedure Date: 08/03/2019 8:58 AM MRN: [id number] Endoscopist: Ladene Artist , MD Age: 64 Referring MD:  Date of Birth: 1955/12/28 Gender: Female Account #: [id number] Procedure:                Colonoscopy Indications:              Screening in patient at increased risk: Family                            history of 1st-degree relative with colorectal                            cancer Medicines:                Monitored Anesthesia Care Procedure:                Pre-Anesthesia Assessment:                           - Prior to the procedure, a History and Physical                            was performed, and patient medications and                            allergies were reviewed. The patient's tolerance of                            previous anesthesia was also reviewed. The risks                            and benefits of the procedure and the sedation                            options and risks were discussed with the patient.                            All questions were answered, and informed consent                            was obtained. Prior Anticoagulants: The patient has                            taken no previous anticoagulant or antiplatelet                            agents. ASA Grade Assessment: II - A patient with                            mild systemic disease. After reviewing the risks                            and benefits, the patient was deemed in  satisfactory condition to undergo the procedure.                           After obtaining informed consent, the colonoscope                            was passed under direct vision. Throughout the                            procedure, the patient's blood pressure, pulse, and                            oxygen saturations were monitored continuously. The                            Colonoscope was introduced through the anus and                advanced to the the cecum, identified by                            appendiceal orifice and ileocecal valve. The                            ileocecal valve, appendiceal orifice, and rectum                            were photographed. The quality of the bowel                            preparation was good after extensive lavage and                            suction. Multiple areas with bowel spasm. The                            colonoscopy was performed without difficulty. The                            patient tolerated the procedure well. Scope In: 9:05:34 AM Scope Out: 9:25:14 AM Scope Withdrawal Time: 0 hours 13 minutes 40 seconds  Total Procedure Duration: 0 hours 19 minutes 40 seconds  Findings:                 The perianal and digital rectal examinations were                            normal.                           A 6 mm polyp was found in the rectum. The polyp was                            sessile. The polyp was removed with a cold snare.  Resection and retrieval were complete.                           Internal hemorrhoids were found during                            retroflexion. The hemorrhoids were mild and Grade I                            (internal hemorrhoids that do not prolapse).                           The exam was otherwise without abnormality on                            direct and retroflexion views. Complications:            No immediate complications. Estimated blood loss:                            None. Estimated Blood Loss:     Estimated blood loss: none. Impression:               - One 6 mm polyp in the rectum, removed with a cold                            snare. Resected and retrieved.                           - Internal hemorrhoids.                           - The examination was otherwise normal on direct                            and retroflexion views. Recommendation:           - Repeat colonoscopy in  5 years for                            surveillance/screening purposes.                           - Patient has a contact number available for                            emergencies. The signs and symptoms of potential                            delayed complications were discussed with the                            patient. Return to normal activities tomorrow.                            Written discharge instructions were provided to the  patient.                           - Resume previous diet.                           - Continue present medications.                           - Await pathology results. Ladene Artist, MD 08/03/2019 9:29:12 AM This report has been signed electronically.

## 2019-08-03 NOTE — Progress Notes (Signed)
Called to room to assist during endoscopic procedure.  Patient ID and intended procedure confirmed with present staff. Received instructions for my participation in the procedure from the performing physician.  

## 2019-08-03 NOTE — Patient Instructions (Signed)
1 polyp and hemorrhoids.   Await pathology for final recommendations.  Handouts on findings given to patient.    YOU HAD AN ENDOSCOPIC PROCEDURE TODAY AT [facility]:   Refer to the procedure report that was given to you for any specific questions about what was found during the examination.  If the procedure report does not answer your questions, please call your gastroenterologist to clarify.  If you requested that your care partner not be given the details of your procedure findings, then the procedure report has been included in a sealed envelope for you to review at your convenience later.  YOU SHOULD EXPECT: Some feelings of bloating in the abdomen. Passage of more gas than usual.  Walking can help get rid of the air that was put into your GI tract during the procedure and reduce the bloating. If you had a lower endoscopy (such as a colonoscopy or flexible sigmoidoscopy) you may notice spotting of blood in your stool or on the toilet paper. If you underwent a bowel prep for your procedure, you may not have a normal bowel movement for a few days.  Please Note:  You might notice some irritation and congestion in your nose or some drainage.  This is from the oxygen used during your procedure.  There is no need for concern and it should clear up in a day or so.  SYMPTOMS TO REPORT IMMEDIATELY:   Following lower endoscopy (colonoscopy or flexible sigmoidoscopy):  Excessive amounts of blood in the stool  Significant tenderness or worsening of abdominal pains  Swelling of the abdomen that is new, acute  Fever of 100F or higher   For urgent or emergent issues, a gastroenterologist can be reached at any hour by calling [phone]. Do not use MyChart messaging for urgent concerns.    DIET:  We do recommend a small meal at first, but then you may proceed to your regular diet.  Drink plenty of fluids but you should avoid alcoholic beverages for 24 hours.  ACTIVITY:  You  should plan to take it easy for the rest of today and you should NOT DRIVE or use heavy machinery until tomorrow (because of the sedation medicines used during the test).    FOLLOW UP: Our staff will call the number listed on your records 48-72 hours following your procedure to check on you and address any questions or concerns that you may have regarding the information given to you following your procedure. If we do not reach you, we will leave a message.  We will attempt to reach you two times.  During this call, we will ask if you have developed any symptoms of COVID 19. If you develop any symptoms (ie: fever, flu-like symptoms, shortness of breath, cough etc.) before then, please call [phone].  If you test positive for Covid 19 in the 2 weeks post procedure, please call and report this information to [place].    If any biopsies were taken you will be contacted by phone or by letter within the next 1-3 weeks.  Please call us at [phone] if you have not heard about the biopsies in 3 weeks.    SIGNATURES/CONFIDENTIALITY: You and/or your care partner have signed paperwork which will be entered into your electronic medical record.  These signatures attest to the fact that that the information above on your After Visit Summary has been reviewed and is understood.  Full responsibility of the confidentiality of this discharge information lies with you  and/or your care-partner. 

## 2019-08-03 NOTE — Progress Notes (Signed)
Temp LC V/s DT I have reviewed the patient's medical history in detail and updated the computerized patient record.

## 2019-08-03 NOTE — Progress Notes (Signed)
Report given to PACU, vss 

## 2019-08-04 DIAGNOSIS — M25552 Pain in left hip: Secondary | ICD-10-CM | POA: Diagnosis not present

## 2019-08-05 ENCOUNTER — Telehealth: Payer: Self-pay | Admitting: *Deleted

## 2019-08-05 NOTE — Telephone Encounter (Signed)
  Follow up Call-  Call back number 08/03/2019  Post procedure Call Back phone  # [phone]  Permission to leave phone message Yes  Some recent data might be hidden     No answer at # given.  LM on VM.

## 2019-08-05 NOTE — Telephone Encounter (Signed)
First follow up call attempt.  Message left to call if any questions or concerns. 

## 2019-08-09 DIAGNOSIS — M25552 Pain in left hip: Secondary | ICD-10-CM | POA: Diagnosis not present

## 2019-08-12 DIAGNOSIS — M25552 Pain in left hip: Secondary | ICD-10-CM | POA: Diagnosis not present

## 2019-08-16 ENCOUNTER — Encounter: Payer: Self-pay | Admitting: Gastroenterology

## 2019-08-19 ENCOUNTER — Ambulatory Visit (INDEPENDENT_AMBULATORY_CARE_PROVIDER_SITE_OTHER): Payer: 59 | Admitting: Family Medicine

## 2019-08-19 ENCOUNTER — Other Ambulatory Visit: Payer: Self-pay

## 2019-08-19 ENCOUNTER — Encounter: Payer: Self-pay | Admitting: Family Medicine

## 2019-08-19 VITALS — BP 146/80 | HR 75 | Temp 98.0°F | Ht 67.0 in | Wt 169.5 lb

## 2019-08-19 DIAGNOSIS — F418 Other specified anxiety disorders: Secondary | ICD-10-CM | POA: Diagnosis not present

## 2019-08-19 DIAGNOSIS — Z6826 Body mass index (BMI) 26.0-26.9, adult: Secondary | ICD-10-CM

## 2019-08-19 DIAGNOSIS — E663 Overweight: Secondary | ICD-10-CM | POA: Diagnosis not present

## 2019-08-19 DIAGNOSIS — R739 Hyperglycemia, unspecified: Secondary | ICD-10-CM

## 2019-08-19 DIAGNOSIS — Z0001 Encounter for general adult medical examination with abnormal findings: Secondary | ICD-10-CM

## 2019-08-19 DIAGNOSIS — Z1159 Encounter for screening for other viral diseases: Secondary | ICD-10-CM

## 2019-08-19 DIAGNOSIS — Z13220 Encounter for screening for lipoid disorders: Secondary | ICD-10-CM

## 2019-08-19 DIAGNOSIS — Z114 Encounter for screening for human immunodeficiency virus [HIV]: Secondary | ICD-10-CM | POA: Diagnosis not present

## 2019-08-19 MED ORDER — ALPRAZOLAM 0.25 MG PO TABS: 0.2500 mg | ORAL_TABLET | Freq: Two times a day (BID) | ORAL | 0 refills | Status: AC | PRN

## 2019-08-19 MED FILL — ALPRAZolam 0.25 MG TABS: 0.25 | 1 days supply | Qty: 4 | Fill #0

## 2019-08-19 NOTE — Patient Instructions (Signed)
It was very nice to see you today!  Keep up the good work.  Please come back in a few weeks to recheck blood work.  No other changes today.  I will send in a few Xanax for you to use for your trip.  I will see back in year with your next physical, or sooner if needed.  Take care, Dr [name]  Please try these tips to maintain a healthy lifestyle:   Eat at least 3 REAL meals and 1-2 snacks per day.  Aim for no more than 5 hours between eating.  If you eat breakfast, please do so within one hour of getting up.    Each meal should contain half fruits/vegetables, one quarter protein, and one quarter carbs (no bigger than a computer mouse)   Cut down on sweet beverages. This includes juice, soda, and sweet tea.     Drink at least 1 glass of water with each meal and aim for at least 8 glasses per day   Exercise at least 150 minutes every week.    Preventive Care 66 Years and Older, Female Preventive care refers to lifestyle choices and visits with your health care provider that can promote health and wellness. This includes:  A yearly physical exam. This is also called an annual well check.  Regular dental and eye exams.  Immunizations.  Screening for certain conditions.  Healthy lifestyle choices, such as diet and exercise. What can I expect for my preventive care visit? Physical exam Your health care provider will check:  Height and weight. These may be used to calculate body mass index (BMI), which is a measurement that tells if you are at a healthy weight.  Heart rate and blood pressure.  Your skin for abnormal spots. Counseling Your health care provider may ask you questions about:  Alcohol, tobacco, and drug use.  Emotional well-being.  Home and relationship well-being.  Sexual activity.  Eating habits.  History of falls.  Memory and ability to understand (cognition).  Work and work Statistician.  Pregnancy and menstrual history. What  immunizations do I need?  Influenza (flu) vaccine  This is recommended every year. Tetanus, diphtheria, and pertussis (Tdap) vaccine  You may need a Td booster every 10 years. Varicella (chickenpox) vaccine  You may need this vaccine if you have not already been vaccinated. Zoster (shingles) vaccine  You may need this after age 3. Pneumococcal conjugate (PCV13) vaccine  One dose is recommended after age 66. Pneumococcal polysaccharide (PPSV23) vaccine  One dose is recommended after age 66. Measles, mumps, and rubella (MMR) vaccine  You may need at least one dose of MMR if you were born in 1957 or later. You may also need a second dose. Meningococcal conjugate (MenACWY) vaccine  You may need this if you have certain conditions. Hepatitis A vaccine  You may need this if you have certain conditions or if you travel or work in places where you may be exposed to hepatitis A. Hepatitis B vaccine  You may need this if you have certain conditions or if you travel or work in places where you may be exposed to hepatitis B. Haemophilus influenzae type b (Hib) vaccine  You may need this if you have certain conditions. You may receive vaccines as individual doses or as more than one vaccine together in one shot (combination vaccines). Talk with your health care provider about the risks and benefits of combination vaccines. What tests do I need? Blood tests  Lipid and cholesterol  levels. These may be checked every 5 years, or more frequently depending on your overall health.  Hepatitis C test.  Hepatitis B test. Screening  Lung cancer screening. You may have this screening every year starting at age 66 if you have a 30-pack-year history of smoking and currently smoke or have quit within the past 15 years.  Colorectal cancer screening. All adults should have this screening starting at age 32 and continuing until age 17. Your health care provider may recommend screening at age 15 if  you are at increased risk. You will have tests every 1-10 years, depending on your results and the type of screening test.  Diabetes screening. This is done by checking your blood sugar (glucose) after you have not eaten for a while (fasting). You may have this done every 1-3 years.  Mammogram. This may be done every 1-2 years. Talk with your health care provider about how often you should have regular mammograms.  BRCA-related cancer screening. This may be done if you have a family history of breast, ovarian, tubal, or peritoneal cancers. Other tests  Sexually transmitted disease (STD) testing.  Bone density scan. This is done to screen for osteoporosis. You may have this done starting at age 32. Follow these instructions at home: Eating and drinking  Eat a diet that includes fresh fruits and vegetables, whole grains, lean protein, and low-fat dairy products. Limit your intake of foods with high amounts of sugar, saturated fats, and salt.  Take vitamin and mineral supplements as recommended by your health care provider.  Do not drink alcohol if your health care provider tells you not to drink.  If you drink alcohol: ? Limit how much you have to 0-1 drink a day. ? Be aware of how much alcohol is in your drink. In the [place] one drink equals one 12 oz bottle of beer (355 mL), one 5 oz glass of wine (148 mL), or one 1 oz glass of hard liquor (44 mL). Lifestyle  Take daily care of your teeth and gums.  Stay active. Exercise for at least 30 minutes on 5 or more days each week.  Do not use any products that contain nicotine or tobacco, such as cigarettes, e-cigarettes, and chewing tobacco. If you need help quitting, ask your health care provider.  If you are sexually active, practice safe sex. Use a condom or other form of protection in order to prevent STIs (sexually transmitted infections).  Talk with your health care provider about taking a low-dose aspirin or statin. What's  next?  Go to your health care provider once a year for a well check visit.  Ask your health care provider how often you should have your eyes and teeth checked.  Stay up to date on all vaccines. This information is not intended to replace advice given to you by your health care provider. Make sure you discuss any questions you have with your health care provider. Document Revised: 02/26/2018 Document Reviewed: 02/26/2018 Elsevier Patient Education  2020 [employer].

## 2019-08-19 NOTE — Assessment & Plan Note (Signed)
Check A1c. 

## 2019-08-19 NOTE — Progress Notes (Signed)
Chief Complaint:  Sandra Clay is a 64 y.o. female who presents today for her annual comprehensive physical exam.    Assessment/Plan:  New/Acute Problems: Situational Anxiety We will send in a few Xanax to use prior to flight.  Chronic Problems Addressed Today: Hyperglycemia Check A1c.   Body mass index is 26.55 kg/m. / Overweight BMI Metric Follow Up - 08/19/19 1042      BMI Metric Follow Up-Please document annually   BMI Metric Follow Up  Education provided        Preventative Healthcare: Has received Covid vaccine.  Will check CBC, C met, TSH, lipid panel.  Up-to-date on Pap smear and DEXA scan-we will obtain his records from OB/GYN.  Patient Counseling(The following topics were reviewed and/or handout was given):  -Nutrition: Stressed importance of moderation in sodium/caffeine intake, saturated fat and cholesterol, caloric balance, sufficient intake of fresh fruits, vegetables, and fiber.  -Stressed the importance of regular exercise.   -Substance Abuse: Discussed cessation/primary prevention of tobacco, alcohol, or other drug use; driving or other dangerous activities under the influence; availability of treatment for abuse.   -Injury prevention: Discussed safety belts, safety helmets, smoke detector, smoking near bedding or upholstery.   -Sexuality: Discussed sexually transmitted diseases, partner selection, use of condoms, avoidance of unintended pregnancy and contraceptive alternatives.   -Dental health: Discussed importance of regular tooth brushing, flossing, and dental visits.  -Health maintenance and immunizations reviewed. Please refer to Health maintenance section.  Return to care in 1 year for next preventative visit.     Subjective:  HPI:  She has no acute complaints today.   Lifestyle Diet: Working with noom app.  Exercise: Limited due to leg pain.   Depression screen PHQ 2/9 08/19/2019  Decreased Interest 0  Down, Depressed, Hopeless 0  PHQ - 2  Score 0   Health Maintenance Due  Topic Date Due  . Hepatitis C Screening  Never done  . COVID-19 Vaccine (1) Never done  . DEXA SCAN  Never done    ROS: Per HPI, otherwise a complete review of systems was negative.   PMH:  The following were reviewed and entered/updated in epic: Past Medical History:  Diagnosis Date  . Anxiety   . Cancer ([place])    skin cancer   . Cataract    left forming   . Depression    situational , resolved   . Headache(784.0)   . Osteoarthritis   . PONV (postoperative nausea and vomiting)    Patient Active Problem List   Diagnosis Date Noted  . History of hip replacement 08/18/2018  . OA (osteoarthritis) of hip 08/03/2018  . Hyperglycemia 05/18/2018  . Skin lump of leg, right 12/30/2017  . Genital herpes 12/30/2017   Past Surgical History:  Procedure Laterality Date  . BUNIONECTOMY    . CESAREAN SECTION     x3  . CHOLECYSTECTOMY    . COLONOSCOPY     ~51 ish per pt  bucini  . FRACTURE SURGERY    . KNEE ARTHROSCOPY W/ MENISCAL REPAIR Right    x2  . ROTATOR CUFF REPAIR  2018   right   . TOTAL HIP ARTHROPLASTY Left 08/03/2018   Procedure: TOTAL HIP ARTHROPLASTY ANTERIOR APPROACH;  Surgeon: Gaynelle Arabian, MD;  Location: [facility];  Service: Orthopedics;  Laterality: Left;  . TUBAL LIGATION    . tubal reversal      Family History  Problem Relation Age of Onset  . Alcohol abuse Mother   .  Breast cancer Mother   . Alcohol abuse Father   . Arthritis Father   . Cancer Father   . Diabetes Father   . Hearing loss Father   . Colon cancer Sister 31  . Alcohol abuse Brother   . Cancer Brother   . Depression Brother   . Drug abuse Brother   . Early death Brother   . Hearing loss Brother   . Depression Sister   . Alcohol abuse Sister   . Breast cancer Maternal Aunt   . Colon polyps Neg Hx   . Esophageal cancer Neg Hx   . Rectal cancer Neg Hx   . Stomach cancer Neg Hx     Medications- reviewed and updated Current Outpatient Medications    Medication Sig Dispense Refill  . valACYclovir (VALTREX) 1000 MG tablet Take 1 g by mouth daily.   12  . ALPRAZolam (XANAX) 0.25 MG tablet Take 1-2 tablets (0.25-0.5 mg total) by mouth 2 (two) times daily as needed for up to 2 days for anxiety. 4 tablet 0   No current facility-administered medications for this visit.    Allergies-reviewed and updated Allergies  Allergen Reactions  . Dilaudid [Hydromorphone] Itching  . Sulfa Antibiotics Rash    Social History   Socioeconomic History  . Marital status: Single    Spouse name: Not on file  . Number of children: Not on file  . Years of education: Not on file  . Highest education level: Not on file  Occupational History  . Not on file  Tobacco Use  . Smoking status: Former Research scientist (life sciences)  . Smokeless tobacco: Never Used  Substance and Sexual Activity  . Alcohol use: Yes    Alcohol/week: 1.0 - 2.0 standard drinks    Types: 1 - 2 Glasses of wine per week    Comment: one to two glasses of wine per day   . Drug use: No  . Sexual activity: Yes    Birth control/protection: Post-menopausal  Other Topics Concern  . Not on file  Social History Narrative   Lives with significant other, Eulas Post.   Has 3 adult children. 2 sons, 1 daughter. Oldest son lives in [place], other son and daughter live in [place].    Social Determinants of Health   Financial Resource Strain:   . Difficulty of Paying Living Expenses:   Food Insecurity:   . Worried About Charity fundraiser in the Last Year:   . Arboriculturist in the Last Year:   Transportation Needs:   . Film/video editor (Medical):   Marland Kitchen Lack of Transportation (Non-Medical):   Physical Activity:   . Days of Exercise per Week:   . Minutes of Exercise per Session:   Stress:   . Feeling of Stress :   Social Connections:   . Frequency of Communication with Friends and Family:   . Frequency of Social Gatherings with Friends and Family:   . Attends Religious Services:   . Active Member  of Clubs or Organizations:   . Attends Archivist Meetings:   Marland Kitchen Marital Status:         Objective:  Physical Exam: BP (!) 146/80 (BP Location: Left Arm, Patient Position: Sitting, Cuff Size: Normal)   Pulse 75   Temp 98 F (36.7 C) (Temporal)   Ht _0  (1.702 m)   Wt 169 lb 8 oz (76.9 kg)   SpO2 98%   BMI 26.55 kg/m   Body mass index is 26.55  kg/m. Wt Readings from Last 3 Encounters:  08/19/19 169 lb 8 oz (76.9 kg)  08/03/19 285 lb (129.3 kg)  07/20/19 172 lb (78 kg)   Gen: NAD, resting comfortably HEENT: TMs normal bilaterally. OP clear. No thyromegaly noted.  CV: RRR with no murmurs appreciated Pulm: NWOB, CTAB with no crackles, wheezes, or rhonchi GI: Normal bowel sounds present. Soft, Nontender, Nondistended. MSK: no edema, cyanosis, or clubbing noted Skin: warm, dry Neuro: CN2-12 grossly intact. Strength 5/5 in upper and lower extremities. Reflexes symmetric and intact bilaterally.  Psych: Normal affect and thought content     Milderd Manocchio M. [name], MD 08/19/2019 10:44 AM

## 2019-08-20 DIAGNOSIS — M25552 Pain in left hip: Secondary | ICD-10-CM | POA: Diagnosis not present

## 2019-09-01 DIAGNOSIS — I8311 Varicose veins of right lower extremity with inflammation: Secondary | ICD-10-CM | POA: Diagnosis not present

## 2019-09-01 DIAGNOSIS — I8312 Varicose veins of left lower extremity with inflammation: Secondary | ICD-10-CM | POA: Diagnosis not present

## 2019-09-09 ENCOUNTER — Other Ambulatory Visit: Payer: Self-pay

## 2019-09-09 ENCOUNTER — Other Ambulatory Visit (INDEPENDENT_AMBULATORY_CARE_PROVIDER_SITE_OTHER): Payer: 59

## 2019-09-09 DIAGNOSIS — Z13220 Encounter for screening for lipoid disorders: Secondary | ICD-10-CM

## 2019-09-09 DIAGNOSIS — Z0001 Encounter for general adult medical examination with abnormal findings: Secondary | ICD-10-CM

## 2019-09-09 DIAGNOSIS — R739 Hyperglycemia, unspecified: Secondary | ICD-10-CM

## 2019-09-09 DIAGNOSIS — Z1159 Encounter for screening for other viral diseases: Secondary | ICD-10-CM

## 2019-09-09 LAB — COMPREHENSIVE METABOLIC PANEL
ALT: 16 U/L (ref 0–35)
AST: 19 U/L (ref 0–37)
Albumin: 4.3 g/dL (ref 3.5–5.2)
Alkaline Phosphatase: 90 U/L (ref 39–117)
BUN: 14 mg/dL (ref 6–23)
CO2: 25 mEq/L (ref 19–32)
Calcium: 9.5 mg/dL (ref 8.4–10.5)
Chloride: 117 mEq/L — ABNORMAL HIGH (ref 96–112)
Creatinine, Ser: 0.77 mg/dL (ref 0.40–1.20)
GFR: 74.9 mL/min (ref >60.00)
Glucose, Bld: 89 mg/dL (ref 70–99)
Potassium: 4.2 mEq/L (ref 3.5–5.1)
Sodium: 126 mEq/L — ABNORMAL LOW (ref 135–145)
Total Bilirubin: 0.5 mg/dL (ref 0.2–1.2)
Total Protein: 6.7 g/dL (ref 6.0–8.3)

## 2019-09-09 LAB — LIPID PANEL
Cholesterol: 179 mg/dL (ref 0–200)
HDL: 60.7 mg/dL (ref >39.00)
LDL Cholesterol: 106 mg/dL — ABNORMAL HIGH (ref 0–99)
NonHDL: 118.19
Total CHOL/HDL Ratio: 3
Triglycerides: 60 mg/dL (ref 0.0–149.0)
VLDL: 12 mg/dL (ref 0.0–40.0)

## 2019-09-09 LAB — CBC
HCT: 41.9 % (ref 36.0–46.0)
Hemoglobin: 14.4 g/dL (ref 12.0–15.0)
MCHC: 34.4 g/dL (ref 30.0–36.0)
MCV: 97 fl (ref 78.0–100.0)
Platelets: 247 10*3/uL (ref 150.0–400.0)
RBC: 4.32 Mil/uL (ref 3.87–5.11)
RDW: 13.7 % (ref 11.5–15.5)
WBC: 5.3 10*3/uL (ref 4.0–10.5)

## 2019-09-09 LAB — TSH: TSH: 1.32 u[IU]/mL (ref 0.35–4.50)

## 2019-09-09 LAB — HEMOGLOBIN A1C: Hgb A1c MFr Bld: 5.8 % (ref 4.6–6.5)

## 2019-09-10 LAB — HEPATITIS C ANTIBODY
Hepatitis C Ab: NONREACTIVE
SIGNAL TO CUT-OFF: 0.04 (ref <1.00)

## 2019-09-13 ENCOUNTER — Other Ambulatory Visit: Payer: Self-pay

## 2019-09-13 DIAGNOSIS — E871 Hypo-osmolality and hyponatremia: Secondary | ICD-10-CM

## 2019-09-13 NOTE — Progress Notes (Signed)
Please inform patient of the following:  Sodium level is low.  I think this is likely a lab error.  Everything else is normal.  Please have her come back in the next week or so to recheck be met.  Please place future order.

## 2019-09-16 ENCOUNTER — Other Ambulatory Visit: Payer: Self-pay | Admitting: *Deleted

## 2019-09-16 DIAGNOSIS — E871 Hypo-osmolality and hyponatremia: Secondary | ICD-10-CM

## 2019-09-21 ENCOUNTER — Other Ambulatory Visit: Payer: 59

## 2019-09-22 ENCOUNTER — Other Ambulatory Visit: Payer: Self-pay

## 2019-09-22 ENCOUNTER — Other Ambulatory Visit (INDEPENDENT_AMBULATORY_CARE_PROVIDER_SITE_OTHER): Payer: 59

## 2019-09-22 DIAGNOSIS — E871 Hypo-osmolality and hyponatremia: Secondary | ICD-10-CM | POA: Diagnosis not present

## 2019-09-22 LAB — BASIC METABOLIC PANEL WITH GFR
BUN: 14 mg/dL (ref 6–23)
CO2: 27 meq/L (ref 19–32)
Calcium: 9.3 mg/dL (ref 8.4–10.5)
Chloride: 105 meq/L (ref 96–112)
Creatinine, Ser: 0.77 mg/dL (ref 0.40–1.20)
GFR: 74.89 mL/min
Glucose, Bld: 99 mg/dL (ref 70–99)
Potassium: 4.3 meq/L (ref 3.5–5.1)
Sodium: 139 meq/L (ref 135–145)

## 2019-09-23 NOTE — Progress Notes (Signed)
Please inform patient of the following:  Sodium back to normal. I think the last test was a lab error. Do not need to do any further testing at this point.

## 2019-09-24 DIAGNOSIS — I8312 Varicose veins of left lower extremity with inflammation: Secondary | ICD-10-CM | POA: Diagnosis not present

## 2019-09-24 DIAGNOSIS — I8311 Varicose veins of right lower extremity with inflammation: Secondary | ICD-10-CM | POA: Diagnosis not present

## 2019-10-01 DIAGNOSIS — I8312 Varicose veins of left lower extremity with inflammation: Secondary | ICD-10-CM | POA: Diagnosis not present

## 2019-10-01 DIAGNOSIS — I8311 Varicose veins of right lower extremity with inflammation: Secondary | ICD-10-CM | POA: Diagnosis not present

## 2019-10-13 MED FILL — valACYclovir HCL 1 GM TABS: 1 | 90 days supply | Qty: 90 | Fill #1

## 2019-10-14 DIAGNOSIS — M25561 Pain in right knee: Secondary | ICD-10-CM | POA: Diagnosis not present

## 2019-10-14 DIAGNOSIS — S83241D Other tear of medial meniscus, current injury, right knee, subsequent encounter: Secondary | ICD-10-CM | POA: Diagnosis not present

## 2019-10-25 DIAGNOSIS — H2512 Age-related nuclear cataract, left eye: Secondary | ICD-10-CM | POA: Diagnosis not present

## 2019-10-25 DIAGNOSIS — H2513 Age-related nuclear cataract, bilateral: Secondary | ICD-10-CM | POA: Diagnosis not present

## 2019-12-08 ENCOUNTER — Other Ambulatory Visit (HOSPITAL_COMMUNITY): Payer: Self-pay | Admitting: Obstetrics & Gynecology

## 2019-12-08 DIAGNOSIS — Z01419 Encounter for gynecological examination (general) (routine) without abnormal findings: Secondary | ICD-10-CM | POA: Diagnosis not present

## 2019-12-08 DIAGNOSIS — Z6825 Body mass index (BMI) 25.0-25.9, adult: Secondary | ICD-10-CM | POA: Diagnosis not present

## 2019-12-22 MED FILL — PREDNISOLONE AC 1% EYE DROP: 1 | 30 days supply | Qty: 5 | Fill #0

## 2019-12-30 ENCOUNTER — Other Ambulatory Visit (HOSPITAL_COMMUNITY): Payer: Self-pay | Admitting: Ophthalmology

## 2019-12-30 MED FILL — DIFLUPREDNATE 0.05 % EMUL: 0.05 | 30 days supply | Qty: 5 | Fill #0

## 2019-12-30 MED FILL — PROLENSA 0.07% EYE DROPS: 0.07 | 30 days supply | Qty: 3 | Fill #0

## 2019-12-30 MED FILL — PREDNISOLONE AC 1% EYE DROP: 1 | 25 days supply | Qty: 5 | Fill #0

## 2019-12-30 MED FILL — GATIFLOXACIN 0.5% EYE DROPS: 0.5 | 50 days supply | Qty: 5 | Fill #0

## 2020-01-06 ENCOUNTER — Other Ambulatory Visit (HOSPITAL_COMMUNITY): Payer: Self-pay | Admitting: Ophthalmology

## 2020-01-06 DIAGNOSIS — H52202 Unspecified astigmatism, left eye: Secondary | ICD-10-CM | POA: Diagnosis not present

## 2020-01-06 DIAGNOSIS — H2511 Age-related nuclear cataract, right eye: Secondary | ICD-10-CM | POA: Diagnosis not present

## 2020-01-06 DIAGNOSIS — H2512 Age-related nuclear cataract, left eye: Secondary | ICD-10-CM | POA: Diagnosis not present

## 2020-01-10 MED FILL — DIFLUPREDNATE 0.05 % EMUL: 0.05 | 28 days supply | Qty: 5 | Fill #0

## 2020-01-13 MED FILL — valACYclovir HCL 1 GM TABS: 1 | 90 days supply | Qty: 90 | Fill #0

## 2020-01-14 DIAGNOSIS — H2513 Age-related nuclear cataract, bilateral: Secondary | ICD-10-CM | POA: Diagnosis not present

## 2020-01-14 MED FILL — GATIFLOXACIN 0.5% EYE DROPS: 0.5 | 12 days supply | Qty: 3 | Fill #0

## 2020-01-14 MED FILL — PROLENSA 0.07% EYE DROPS: 0.07 | 30 days supply | Qty: 3 | Fill #0

## 2020-01-20 DIAGNOSIS — H52201 Unspecified astigmatism, right eye: Secondary | ICD-10-CM | POA: Diagnosis not present

## 2020-01-20 DIAGNOSIS — H2513 Age-related nuclear cataract, bilateral: Secondary | ICD-10-CM | POA: Diagnosis not present

## 2020-01-20 DIAGNOSIS — H2511 Age-related nuclear cataract, right eye: Secondary | ICD-10-CM | POA: Diagnosis not present

## 2020-02-24 DIAGNOSIS — S83241D Other tear of medial meniscus, current injury, right knee, subsequent encounter: Secondary | ICD-10-CM | POA: Diagnosis not present

## 2020-03-18 DIAGNOSIS — I639 Cerebral infarction, unspecified: Secondary | ICD-10-CM

## 2020-03-18 HISTORY — DX: Cerebral infarction, unspecified: I63.9

## 2020-03-20 ENCOUNTER — Telehealth: Payer: Self-pay

## 2020-03-20 ENCOUNTER — Other Ambulatory Visit: Payer: Self-pay

## 2020-03-20 ENCOUNTER — Telehealth (INDEPENDENT_AMBULATORY_CARE_PROVIDER_SITE_OTHER): Payer: 59 | Admitting: Family Medicine

## 2020-03-20 ENCOUNTER — Other Ambulatory Visit: Payer: Self-pay | Admitting: Family Medicine

## 2020-03-20 DIAGNOSIS — R059 Cough, unspecified: Secondary | ICD-10-CM | POA: Diagnosis not present

## 2020-03-20 DIAGNOSIS — R300 Dysuria: Secondary | ICD-10-CM

## 2020-03-20 DIAGNOSIS — U071 COVID-19: Secondary | ICD-10-CM | POA: Diagnosis not present

## 2020-03-20 MED ORDER — NITROFURANTOIN MONOHYD MACRO 100 MG PO CAPS
100.0000 mg | ORAL_CAPSULE | Freq: Two times a day (BID) | ORAL | 0 refills | Status: DC
Start: 2020-03-20 — End: 2020-04-10

## 2020-03-20 MED ORDER — BENZONATATE 100 MG PO CAPS: 100.0000 mg | ORAL_CAPSULE | Freq: Three times a day (TID) | ORAL | 0 refills | Status: DC | PRN

## 2020-03-20 MED FILL — NITROFURANTOIN MONO-MCR 100: 100 | 7 days supply | Qty: 14 | Fill #0

## 2020-03-20 MED FILL — BENZONATATE 100 MG CAPS: 100 | 6 days supply | Qty: 20 | Fill #0

## 2020-03-20 NOTE — Progress Notes (Signed)
Virtual Visit via Video Note  I connected with Sandra Clay  on 03/20/20 at  4:40 PM EST by a video enabled telemedicine application and verified that I am speaking with the correct person using two identifiers.  Location patient: home, [place] Location provider:work or home office Persons participating in the virtual visit: patient, provider  I discussed the limitations of evaluation and management by telemedicine and the availability of in person appointments. The patient expressed understanding and agreed to proceed.   HPI:  Acute telemedicine visit for : -Onset: 3 days ago -Symptoms include: nasal congestion, cough, fever 100.5, body ache, sinus headache, mildly winded yesterday - better today -tested positive for covid yesterday -Denies: CP, SOB, NVD, inability to get out of bed/eat and drink -Pertinent past medical history: see belwo -Pertinent medication allergies: sulfa, dilaudid -COVID-19 vaccine status: fully vaccinated, boosted  Another issue:  UTI: -4 days ago -urinary frequency, urgency, dysuria with urination -hx of UTI and feels the same -flank pain, abd or flank pain, hematuria   ROS: See pertinent positives and negatives per HPI.  Past Medical History:  Diagnosis Date  . Anxiety   . Cancer ([place])    skin cancer   . Cataract    left forming   . Depression    situational , resolved   . Headache(784.0)   . Osteoarthritis   . PONV (postoperative nausea and vomiting)     Past Surgical History:  Procedure Laterality Date  . BUNIONECTOMY    . CESAREAN SECTION     x3  . CHOLECYSTECTOMY    . COLONOSCOPY     ~51 ish per pt  bucini  . FRACTURE SURGERY    . KNEE ARTHROSCOPY W/ MENISCAL REPAIR Right    x2  . ROTATOR CUFF REPAIR  2018   right   . TOTAL HIP ARTHROPLASTY Left 08/03/2018   Procedure: TOTAL HIP ARTHROPLASTY ANTERIOR APPROACH;  Surgeon: Gaynelle Arabian, MD;  Location: [facility];  Service: Orthopedics;  Laterality: Left;  . TUBAL LIGATION    . tubal reversal        Current Outpatient Medications:  .  benzonatate (TESSALON PERLES) 100 MG capsule, Take 1 capsule (100 mg total) by mouth 3 (three) times daily as needed., Disp: 20 capsule, Rfl: 0 .  nitrofurantoin, macrocrystal-monohydrate, (MACROBID) 100 MG capsule, Take 1 capsule (100 mg total) by mouth 2 (two) times daily., Disp: 14 capsule, Rfl: 0 .  valACYclovir (VALTREX) 1000 MG tablet, Take 1 g by mouth daily. , Disp: , Rfl: 12  EXAM:  VITALS per patient if applicable:  GENERAL: alert, oriented, appears well and in no acute distress  HEENT: atraumatic, conjunttiva clear, no obvious abnormalities on inspection of external nose and ears  NECK: normal movements of the head and neck  LUNGS: on inspection no signs of respiratory distress, breathing rate appears normal, no obvious gross SOB, gasping or wheezing  CV: no obvious cyanosis  MS: moves all visible extremities without noticeable abnormality  PSYCH/NEURO: pleasant and cooperative, no obvious depression or anxiety, speech and thought processing grossly intact  ASSESSMENT AND PLAN:  Discussed the following assessment and plan:  COVID-19  Cough  Dysuria  -we discussed possible serious and likely etiologies, options for evaluation and workup, limitations of telemedicine visit vs in person visit, treatment, treatment risks and precautions. Pt prefers to treat via telemedicine empirically rather than in person at this moment.  For Covid and the symptoms, discussed treatment options, potential complications, isolation and precautions.  Opted for [facility] Rx  for cough, nasal saline, short course of nasal decongestant, fluids, vitamin C and D.  Analgesic if needed.  Declined work note. For the dysuria, given her history, she feels  that this is likely a UTI and opted for empiric trial of Macrobid 100 mg twice daily for 7 days. Advised to seek prompt in person care if worsening, new symptoms arise, or if is not improving with treatment.  Discussed options for inperson care if PCP office not available.Advised to schedule follow up visit with PCP or [facility] if any further questions or concerns to avoid delays in care.   I discussed the assessment and treatment plan with the patient. The patient was provided an opportunity to ask questions and all were answered. The patient agreed with the plan and demonstrated an understanding of the instructions.     Sandra Koyanagi, DO

## 2020-03-20 NOTE — Telephone Encounter (Signed)
Nurse Assessment Nurse: Willeen Cass, RN, [facility] Date/Time Sandra Clay Time): 03/19/2020 4:10:11 PM Confirm and document reason for call. If symptomatic, describe symptoms. ---Caller states sick during the night w/chills, coughing; tested pos w/home test. States temp is 100.5 (orally). Also has fatigue. Denies other sx. Does the patient have any new or worsening symptoms? ---Yes Will a triage be completed? ---Yes Related visit to physician within the last 2 weeks? ---No Does the PT have any chronic conditions? (i.e. diabetes, asthma, this includes High risk factors for pregnancy, etc.) ---No Is this a behavioral health or substance abuse call? ---No Guidelines Guideline Title Affirmed Question Affirmed Notes Nurse Date/Time (Eastern Time) COVID-19 - Diagnosed or Suspected MILD difficulty breathing (e.g., minimal/no SOB at rest, SOB with walking, pulse <100) Willeen Cass, RN, [place] [facility] 03/19/2020 4:11:30 PM Disp. Time Sandra Clay Time) Disposition Final User 03/19/2020 4:16:05 PM See HCP within 4 Hours (or PCP triage) Yes Willeen Cass, RN, Tacey Heap Disagree/Comply Comply PLEASE NOTE: All timestamps contained within this report are represented as [place] Standard Time. CONFIDENTIALTY NOTICE: This fax transmission is intended only for the addressee. It contains information that is legally privileged, confidential or otherwise protected from use or disclosure. If you are not the intended recipient, you are strictly prohibited from reviewing, disclosing, copying using or disseminating any of this information or taking any action in reliance on or regarding this information. If you have received this fax in error, please notify us immediately by telephone so that we can arrange for its return to [place]. Phone: [phone], Toll-Free: [phone], Fax: [phone] Page: 2 of 2 Call Id: [id number] Caller Understands Yes PreDisposition Home Care Care Advice Given Per Guideline * IF OFFICE WILL BE CLOSED AND NO PCP  (PRIMARY CARE PROVIDER) SECOND-LEVEL TRIAGE: You need to be seen within the next 3 or 4 hours. A nearby Urgent Care Center [facility]) is often a good source of care. Another choice is to go to the ED. Go sooner if you become worse. SEE HCP (OR PCP TRIAGE) WITHIN 4 HOURS: * The treatment is the same whether you have COVID-19, influenza or some other respiratory virus. * Cough: Use cough drops. * Feeling dehydrated: Drink extra liquids. If the air in your home is dry, use a humidifier. * Fever: For fever over 101 F (38.3 C), take acetaminophen every 4 to 6 hours (Adults 650 mg) OR ibuprofen every 6 to 8 hours (Adults 400 mg). Before taking any medicine, read all the instructions on the package. Do not take aspirin unless your doctor has prescribed it for you. * Muscle aches, headache, and other pains: Often this comes and goes with the fever. Take acetaminophen every 4 to 6 hours (Adults 650 mg) OR ibuprofen every 6 to 8 hours (Adults 400 mg). Before taking any medicine, read all the instructions on the package. CALL BACK IF: * You become worse CARE ADVICE given per COVID-19 - DIAGNOSED OR SUSPECTED (Adult) guideline. Referrals GO TO FACILITY UNDECIDE

## 2020-03-20 NOTE — Telephone Encounter (Signed)
Please schedule virtual 

## 2020-03-20 NOTE — Telephone Encounter (Signed)
Patient is scheduled for Dr.Kim tonight.

## 2020-03-20 NOTE — Patient Instructions (Signed)
  HOME CARE TIPS:  -I sent the medication(s) we discussed to your pharmacy: Meds ordered this encounter  Medications  . nitrofurantoin, macrocrystal-monohydrate, (MACROBID) 100 MG capsule    Sig: Take 1 capsule (100 mg total) by mouth 2 (two) times daily.    Dispense:  14 capsule    Refill:  0    -can use tylenol or aleve if needed for fevers, aches and pains per instructions  -can use nasal saline a few times per day if nasal congestion, sometimes a short course of Afrin nasal spray for 3 days can help as well  -stay hydrated, drink plenty of fluids and eat small healthy meals - avoid dairy  -can take 1000 IU Vit D3 and Vit C lozenges per instructions  -If the Covid test is positive, check out the CDC website for more information on home care, transmission and treatment for COVID19  -follow up with your doctor in 2-3 days unless improving and feeling better  -stay home while sick, except to seek medical care, and if you have COVID19 please stay home for a full 10 days since the onset of symptoms PLUS one day of no fever and feeling better.  It was nice to meet you today, and I really hope you are feeling better soon. I help [facility] out with telemedicine visits on Tuesdays and Thursdays and am available for visits on those days. If you have any concerns or questions following this visit please schedule a follow up visit with your Primary Care doctor or seek care at a local urgent care clinic to avoid delays in care.    Seek in person care promptly if your symptoms worsen, new concerns arise or you are not improving with treatment. Call 911 and/or seek emergency care if you symptoms are severe or life threatening.

## 2020-04-09 DIAGNOSIS — H532 Diplopia: Secondary | ICD-10-CM | POA: Diagnosis not present

## 2020-04-10 ENCOUNTER — Encounter: Payer: Self-pay | Admitting: Family Medicine

## 2020-04-10 ENCOUNTER — Other Ambulatory Visit: Payer: Self-pay

## 2020-04-10 ENCOUNTER — Encounter (HOSPITAL_COMMUNITY): Payer: Self-pay

## 2020-04-10 ENCOUNTER — Ambulatory Visit: Payer: 59 | Admitting: Family Medicine

## 2020-04-10 ENCOUNTER — Ambulatory Visit (HOSPITAL_COMMUNITY)
Admission: RE | Admit: 2020-04-10 | Discharge: 2020-04-10 | Disposition: A | Discharge status: Home / Self Care | Payer: 59 | Source: Ambulatory Visit | Attending: Family Medicine | Admitting: Family Medicine

## 2020-04-10 VITALS — BP 164/86 | HR 78 | Temp 97.2°F | Ht 67.0 in | Wt 168.0 lb

## 2020-04-10 DIAGNOSIS — R030 Elevated blood-pressure reading, without diagnosis of hypertension: Secondary | ICD-10-CM | POA: Diagnosis not present

## 2020-04-10 DIAGNOSIS — E785 Hyperlipidemia, unspecified: Secondary | ICD-10-CM

## 2020-04-10 DIAGNOSIS — H532 Diplopia: Secondary | ICD-10-CM | POA: Insufficient documentation

## 2020-04-10 DIAGNOSIS — R739 Hyperglycemia, unspecified: Secondary | ICD-10-CM

## 2020-04-10 MED ORDER — ROSUVASTATIN CALCIUM 10 MG PO TABS: 10.0000 mg | ORAL_TABLET | Freq: Every day | ORAL | 3 refills | Status: DC

## 2020-04-10 MED FILL — ROSUVASTATIN CALCIUM 10 MG: 10 | 90 days supply | Qty: 90 | Fill #0

## 2020-04-10 NOTE — Progress Notes (Addendum)
Phone [phone] In person visit   Subjective:   Sandra Clay is a 65 y.o. year old very pleasant female patient who presents for/with See problem oriented charting Chief Complaint  Patient presents with  . Hypertension    Saturday at about 6:15pm Patient states that her vision went out and everything was a blur , She states that she tried to walk it off but she kept walking to the left so she sat back down. She went and seen her eye doctor on Sunday and he states that her right eye has dropped.    This visit occurred during the SARS-CoV-2 public health emergency.  Safety protocols were in place, including screening questions prior to the visit, additional usage of staff PPE, and extensive cleaning of exam room while observing appropriate contact time as indicated for disinfecting solutions.   Past Medical History-  Patient Active Problem List   Diagnosis Date Noted  . History of hip replacement 08/18/2018  . OA (osteoarthritis) of hip 08/03/2018  . Hyperglycemia 05/18/2018  . Skin lump of leg, right 12/30/2017  . Genital herpes 12/30/2017    Medications- reviewed and updated Current Outpatient Medications  Medication Sig Dispense Refill  . Doxylamine Succinate, Sleep, (UNISOM PO) Take by mouth.    . Multiple Vitamins-Minerals (ADULT GUMMY PO) Take by mouth. Patient states that she takes CBD gummy at night    . rosuvastatin (CRESTOR) 10 MG tablet Take 1 tablet (10 mg total) by mouth daily. 90 tablet 3  . valACYclovir (VALTREX) 1000 MG tablet Take 1 g by mouth daily.  12  . VITAMIN D PO Take by mouth.     No current facility-administered medications for this visit.     Objective:  BP (!) 164/86   Pulse 78   Temp (!) 97.2 F (36.2 C) (Temporal)   Ht 5\' 7"  (1.702 m)   Wt 168 lb (76.2 kg)   SpO2 98%   BMI 26.31 kg/m  Gen: NAD, resting comfortably CV: RRR no murmurs rubs or gallops Lungs: CTAB no crackles, wheeze, rhonchi Ext: no edema Skin: warm, dry  Neuro: CN II-XII  intact, sensation and reflexes normal throughout, 5/5 muscle strength in bilateral upper and lower extremities. Normal finger to nose. Normal rapid alternating movements. No pronator drift. Normal romberg. Normal gait.    EKG: sinus rhythm with rate 81, normal axis, normal intervals, no hypertrophy, no st or t wave changes     Assessment and Plan   #Diplopia/hypertension S:saturday at 6 15 PM was looking at her phone- then noted pretty striking vertical double vision. Felt like she had multiple drinks and she had not been drinking- felt very off balance. Also had nausea.  No pain. Ate dinner and tried to walk the dog with some difficulty with persistent vertical double vision. 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 Just had cataract surgery in November and thought perhaps lens dropped- called eye doctor [facility]- they opted to see her the next morning Sunday 9 AM. They did a prism test and looked like right eye had dropped slightly- could see correctly with prism. If she closed one eye or the other could see normal. Went to an opera [place] [facility] and if lifted right eye slightly could see normally.  They had discussed myasthenia gravis, stroke, TIA, hypothyroidism. Does not have headache but has sensation that may get headache. No eye pain. Not worse as day has gone on (less likely  myasthenia gravis)  Patient without history of hypertension but as noted recently blood pressures have been elevated. Blood pressure at home as high as 200/120 on Saturday. Saturday night 200/100. Sunday 150/90. Vision is much improved- feels like could use mild adjustment. Vertigo much improved and nausea resolved.   Of note patient with covid-19 starting in early January and also treated for UTI (treated with nitrofurantoin 100 mg twice daily) on March 20, 2020 by Dr. Maudie Mercury of [place] A/P:  65 year old female with mild hyperlipidemia and no family history of stroke  presenting with vertical diplopia new onset Saturday 04/08/20 and has already seen optho. Would have considered ED but works in ED and was hesitant with covid situation.  -we opted to get stat head CT - if head CT negative for stroke- will start aspirin 81 mg -  start rosuvastatin 10 mg daily #90 with 3 refills - if CT negative will get MRI (I have had issues ordering CT and MRI at same time wit insurances so holding off for now)  - EKG today, consider cardiac monitor -consider echocardiogram, carotid duplex depending on results -Risk factors overall appear low in regards to potential stroke-this does not seem to be myasthenia gravis to me know. Does not appear to be TIA with persistent symptoms over 24 hours -Blood pressure is elevated but will allow for permissive hypertension for now due to possible recent stroke -Check labs including TSH but doubt thyroid disease as cause of symptoms  #hyperlipidemia S: Medication: None Lab Results  Component Value Date   CHOL 179 09/09/2019   HDL 60.70 09/09/2019   LDLCALC 106 (H) 09/09/2019   TRIG 60.0 09/09/2019   CHOLHDL 3 09/09/2019   A/P: In light of potential stroke LDL above 100 is not ideal-we'll start rosuvastatin 10 mg as above-obviously can reassess depending on results  # Hyperglycemia/insulin resistance/prediabetes S:  Medication: none Lab Results  Component Value Date   HGBA1C 5.8 09/09/2019   HGBA1C 5.9 05/18/2018   A/P: Patient with history of prediabetes but no diabetes to contribute to risk factor for stroke  Recommended follow up: Follow-up depends on blood work and CT work-up  Lab/Order associations:   ICD-10-CM   1. Vertical diplopia  H53.2 CBC with Differential/Platelet    Comprehensive metabolic panel    TSH    EKG 12-Lead    CT Head Wo Contrast    CANCELED: CT Head Wo Contrast    CANCELED: CT Head Wo Contrast  2. Hyperlipidemia, unspecified hyperlipidemia type  E78.5   3. Hyperglycemia  R73.9   4. Elevated blood  pressure reading  R03.0     Meds ordered this encounter  Medications  . rosuvastatin (CRESTOR) 10 MG tablet    Sig: Take 1 tablet (10 mg total) by mouth daily.    Dispense:  90 tablet    Refill:  3   Time Spent: 41 minutes of total time (9:14 AM- 9:22 AM, 10:33 AM- 11:02, 1:00 PM- 1:04 PM with EKG interpretation occurring outside of this timeframe) was spent on the date of the encounter performing the following actions: chart review prior to seeing the patient, obtaining history, performing a medically necessary exam, counseling on the treatment plan, placing orders, and documenting in our EHR.   Return precautions advised.  Garret Reddish, MD

## 2020-04-10 NOTE — Addendum Note (Signed)
Addended by: Marin Olp on: 04/10/2020 08:58 PM   Modules accepted: Orders

## 2020-04-10 NOTE — Patient Instructions (Addendum)
Health Maintenance Due  Topic Date Due  . PAP SMEAR-Modifier Sign release form for records.  Never done  . COVID-19 Vaccine (1) Patient states that she has her vaccine she doesn't remember the dates. Please send [place] a copy Never done  . TETANUS/TDAP Needs to call [facility] for this information.  Never done  . DEXA SCAN Never had.  Never done  . PNA vac Low Risk Adult (1 of 2 - PCV13) Please discuss with her today.  Never done  . INFLUENZA VACCINE Needs to call [facility] for this information.  10/17/2019   Sit tight we will see if we can get stat head CT today  Start rosuvastatin 10mg  for cholesterol  If ct negative- start aspirin 81mg  and we will order MRI at that point.   Recommended follow up: as needed for new or worsening symptoms

## 2020-04-12 ENCOUNTER — Ambulatory Visit (HOSPITAL_COMMUNITY)
Admission: RE | Admit: 2020-04-12 | Discharge: 2020-04-12 | Disposition: A | Discharge status: Home / Self Care | Payer: 59 | Source: Ambulatory Visit | Attending: Family Medicine | Admitting: Family Medicine

## 2020-04-12 ENCOUNTER — Telehealth: Payer: Self-pay | Admitting: *Deleted

## 2020-04-12 ENCOUNTER — Other Ambulatory Visit: Payer: Self-pay

## 2020-04-12 DIAGNOSIS — Z8673 Personal history of transient ischemic attack (TIA), and cerebral infarction without residual deficits: Secondary | ICD-10-CM

## 2020-04-12 DIAGNOSIS — G9389 Other specified disorders of brain: Secondary | ICD-10-CM | POA: Diagnosis not present

## 2020-04-12 DIAGNOSIS — H532 Diplopia: Secondary | ICD-10-CM | POA: Diagnosis not present

## 2020-04-12 DIAGNOSIS — I6389 Other cerebral infarction: Secondary | ICD-10-CM | POA: Diagnosis not present

## 2020-04-12 DIAGNOSIS — I6782 Cerebral ischemia: Secondary | ICD-10-CM | POA: Diagnosis not present

## 2020-04-12 MED ORDER — GADOBUTROL 1 MMOL/ML IV SOLN
7.0000 mL | Freq: Once | INTRAVENOUS | Status: AC | PRN
  Administered 2020-04-12: 7 mL via INTRAVENOUS

## 2020-04-12 NOTE — Telephone Encounter (Signed)
Sandra Clay- thanks for the call  Spoke with patient and informed her. Thankfully symptoms have resolved. Has follow up Dr. Kathlen Mody optho on Friday  Patient already on aspirin 81 mg and rosuvastatin- states compliant with both Ordered carotid doppler and echo (told her if Dr. [name] has a different opinion on these we may change them) Encouraged follow up with Dr. [name]- home BPs still running high- should be trending down in coming days in regards to permissive hypertension. She is going to call today -discussed cardiac monitoring or neurology referral- im happy to order either if Dr. [name] wants

## 2020-04-12 NOTE — Telephone Encounter (Signed)
Critical MRI result received from Dr. Posey Pronto with [facility] "subacute infarct, R mid-brain, likely explaining visual sx, a few days old"  I personally called Dr. Yong Channel to review this with him  Attaching PCP as Juluis Rainier

## 2020-04-12 NOTE — Telephone Encounter (Signed)
Sta[place] form radiology call  Dr Posey Pronto will like to speak with a Provider about critical MRI results from patient  Please return call at [phone]

## 2020-04-13 ENCOUNTER — Encounter: Payer: Self-pay | Admitting: Family Medicine

## 2020-04-13 NOTE — Telephone Encounter (Signed)
Thanks for taking care of her. Agree with the stroke work up. I am happy to follow up on the results as needed.  [name]. [name], MD 04/13/2020 8:25 AM

## 2020-04-13 NOTE — Telephone Encounter (Signed)
Pt called about BP check and has questions about her diagnosis and getting blood work done. Scheduled patient in office on Monday 1/31 with Dr. [name]

## 2020-04-13 NOTE — Addendum Note (Signed)
Addended by: Marin Olp on: 04/13/2020 12:55 PM   Modules accepted: Orders

## 2020-04-14 ENCOUNTER — Other Ambulatory Visit: Payer: Self-pay

## 2020-04-14 ENCOUNTER — Ambulatory Visit (HOSPITAL_COMMUNITY)
Admission: RE | Admit: 2020-04-14 | Discharge: 2020-04-14 | Disposition: A | Discharge status: Home / Self Care | Payer: 59 | Source: Ambulatory Visit | Attending: Family Medicine | Admitting: Family Medicine

## 2020-04-14 DIAGNOSIS — Z8673 Personal history of transient ischemic attack (TIA), and cerebral infarction without residual deficits: Secondary | ICD-10-CM | POA: Diagnosis not present

## 2020-04-14 DIAGNOSIS — H532 Diplopia: Secondary | ICD-10-CM | POA: Diagnosis not present

## 2020-04-17 ENCOUNTER — Ambulatory Visit: Payer: 59 | Admitting: Family Medicine

## 2020-04-17 ENCOUNTER — Encounter: Payer: Self-pay | Admitting: Family Medicine

## 2020-04-17 ENCOUNTER — Other Ambulatory Visit: Payer: Self-pay | Admitting: Family Medicine

## 2020-04-17 ENCOUNTER — Other Ambulatory Visit: Payer: Self-pay

## 2020-04-17 VITALS — BP 174/78 | HR 99 | Temp 97.9°F | Ht 67.0 in | Wt 168.4 lb

## 2020-04-17 DIAGNOSIS — I639 Cerebral infarction, unspecified: Secondary | ICD-10-CM | POA: Diagnosis not present

## 2020-04-17 DIAGNOSIS — F4329 Adjustment disorder with other symptoms: Secondary | ICD-10-CM | POA: Diagnosis not present

## 2020-04-17 MED ORDER — CLOPIDOGREL BISULFATE 75 MG PO TABS: 75.0000 mg | ORAL_TABLET | Freq: Every day | ORAL | 0 refills | Status: DC

## 2020-04-17 MED ORDER — ROSUVASTATIN CALCIUM 20 MG PO TABS: 20.0000 mg | ORAL_TABLET | Freq: Every day | ORAL | 3 refills | Status: DC

## 2020-04-17 MED ORDER — DIAZEPAM 5 MG PO TABS: 2.5000 mg | ORAL_TABLET | Freq: Two times a day (BID) | ORAL | 1 refills | Status: DC | PRN

## 2020-04-17 MED FILL — diazePAM 5 MG TABS: 5 | 15 days supply | Qty: 30 | Fill #0

## 2020-04-17 MED FILL — ROSUVASTATIN CALCIUM 20 MG: 20 | 90 days supply | Qty: 90 | Fill #0

## 2020-04-17 MED FILL — CLOPIDOGREL 75 MG TABLET: 75 | 30 days supply | Qty: 30 | Fill #0

## 2020-04-17 NOTE — Progress Notes (Signed)
Sandra Clay is a 65 y.o. female who presents today for an office visit.  Assessment/Plan:  New/Acute Problems: Adjustment Disorder Patient under quite a bit of stress since her stroke diagnosis.  Discussed treatment options.  We will start low-dose Valium as needed.  Hopefully her symptoms will improve as we complete her post-stroke work up.   Neck Pain No red flags.  Possibly secondary to stress.  Recommended heating pad as needed.  Chronic Problems Addressed Today: Cerebrovascular accident (CVA) [facility]) Had extensive discussion with patient regarding her recent diagnosis and extensive management.  She is currently hypertension though has previously been well controlled and we are allowing for permissive hypertensive until we fully rule out occlusion of her arteries.  Will order MRA head and neck today.  We will start Plavix 75 mg daily for the next 3 weeks in addition to her aspirin and then continue with aspirin alone.  Will increase dose of Crestor from 10 to 20 mg daily.  Also place referral to Occupational Therapy.  Stroke does not seem to be embolic in nature-do not think we need to get TEE or cardiac monitoring at this point though also defer to neurology.  Advised her to follow-up with neurology soon.     Subjective:  HPI:  Patient here for follow-up.  She was diagnosed with a stroke last week.  Her symptoms started 9 days ago with double vision.  She called her eye doctor and was seen 8 days ago and was told that her symptoms did not have anything to do with her eyes.  She was seen here a week ago by a different provider.  Had MRI done which showed subacute infarct in right superior central midbrain.  Her visual symptoms have improved however she has noticed decreased dexterity in her left hand.  She also sometimes run into doorways and has had a bit more drooling as well.  She was recently started on Crestor 10 mg daily and a daily aspirin.  She has been tolerating both these  well.  She was referred to neurology but is not yet seen them yet.  Had carotid Dopplers done which were within expected parameters.  She has never had a history of stroke in the past.  She has been under a lot of stress since her stroke diagnosis week ago.  She sometime intermittently tearful.  She feels overall sense of impending doom and is afraid that she could have another stroke at any minute.  She has never had any issues with anxiety or depression in the past.  She is also had some neck pain for the last several days.  Symptoms are currently manageable.  She has noticed a little bit of decreased sensation in the right upper extremity.       Objective:  Physical Exam: BP (!) 174/78   Pulse 99   Temp 97.9 F (36.6 C) (Temporal)   Ht 5\' 7"  (1.702 m)   Wt 168 lb 6.4 oz (76.4 kg)   SpO2 100%   BMI 26.38 kg/m   Gen: No acute distress, resting comfortably CV: Regular rate and rhythm with no murmurs appreciated Pulm: Normal work of breathing, clear to auscultation bilaterally with no crackles, wheezes, or rhonchi Neuro: Cranial nerves II through XII intact.  Strength 5 out of 5 in upper and lower extremities.  Slightly decreased dexterity in left hand. Psych: Normal affect and thought content  Time Spent: 45 minutes of total time was spent on the date of the  encounter performing the following actions: chart review prior to seeing the patient including recent imaging and office visits, obtaining history, performing a medically necessary exam, counseling on the treatment plan, placing orders, and documenting in our EHR.        [name]. [name], MD 04/17/2020 12:51 PM

## 2020-04-17 NOTE — Patient Instructions (Signed)
It was very nice to see you today!  Please start Plavix in addition to the aspirin for the next 21 days.  Please and continue aspirin alone.  We will check an MRI of your head and neck.  Please try low-dose iron as needed to help with your nerves.  I will also place referral for you to see the occupational therapist.  Please increase your dose of Crestor to 20 mg daily.  Please call Dr. Clydene Fake office to schedule an appointment soon.  Take care, Dr [name]  Please try these tips to maintain a healthy lifestyle:   Eat at least 3 REAL meals and 1-2 snacks per day.  Aim for no more than 5 hours between eating.  If you eat breakfast, please do so within one hour of getting up.    Each meal should contain half fruits/vegetables, one quarter protein, and one quarter carbs (no bigger than a computer mouse)   Cut down on sweet beverages. This includes juice, soda, and sweet tea.     Drink at least 1 glass of water with each meal and aim for at least 8 glasses per day   Exercise at least 150 minutes every week.

## 2020-04-17 NOTE — Assessment & Plan Note (Addendum)
Had extensive discussion with patient regarding her recent diagnosis and extensive management.  She is currently hypertension though has previously been well controlled and we are allowing for permissive hypertensive until we fully rule out occlusion of her arteries.  Will order MRA head and neck today.  We will start Plavix 75 mg daily for the next 3 weeks in addition to her aspirin and then continue with aspirin alone.  Will increase dose of Crestor from 10 to 20 mg daily.  Also place referral to Occupational Therapy.  Stroke does not seem to be embolic in nature-do not think we need to get TEE or cardiac monitoring at this point though also defer to neurology.  Advised her to follow-up with neurology soon.

## 2020-04-18 ENCOUNTER — Ambulatory Visit (HOSPITAL_COMMUNITY)
Admission: RE | Admit: 2020-04-18 | Discharge: 2020-04-18 | Disposition: A | Discharge status: Home / Self Care | Payer: 59 | Source: Ambulatory Visit | Attending: Family Medicine | Admitting: Family Medicine

## 2020-04-18 DIAGNOSIS — I639 Cerebral infarction, unspecified: Secondary | ICD-10-CM

## 2020-04-18 MED ORDER — GADOBUTROL 1 MMOL/ML IV SOLN
7.0000 mL | Freq: Once | INTRAVENOUS | Status: AC | PRN
  Administered 2020-04-18: 7 mL via INTRAVENOUS

## 2020-04-18 MED FILL — valACYclovir HCL 1 GM TABS: 1 | 90 days supply | Qty: 90 | Fill #1

## 2020-04-19 NOTE — Progress Notes (Signed)
Please inform patient of the following:  Her MRA does not show any blockages that would have caused her stroke. She does have a small plaque build up in her small blood vessels but I am not sure if this is significant. Recommend she follow up with neurology as we discussed to see if we need to do anything else.  Sandra Clay. [name], MD 04/19/2020 8:20 AM

## 2020-04-21 ENCOUNTER — Other Ambulatory Visit: Payer: Self-pay

## 2020-04-21 ENCOUNTER — Ambulatory Visit: Payer: 59 | Attending: Family Medicine | Admitting: Occupational Therapy

## 2020-04-21 ENCOUNTER — Encounter: Payer: Self-pay | Admitting: Occupational Therapy

## 2020-04-21 DIAGNOSIS — R2681 Unsteadiness on feet: Secondary | ICD-10-CM | POA: Diagnosis not present

## 2020-04-21 DIAGNOSIS — M6281 Muscle weakness (generalized): Secondary | ICD-10-CM | POA: Insufficient documentation

## 2020-04-21 DIAGNOSIS — I69854 Hemiplegia and hemiparesis following other cerebrovascular disease affecting left non-dominant side: Secondary | ICD-10-CM | POA: Diagnosis not present

## 2020-04-21 DIAGNOSIS — R41842 Visuospatial deficit: Secondary | ICD-10-CM | POA: Insufficient documentation

## 2020-04-21 DIAGNOSIS — I639 Cerebral infarction, unspecified: Secondary | ICD-10-CM | POA: Insufficient documentation

## 2020-04-21 DIAGNOSIS — R2689 Other abnormalities of gait and mobility: Secondary | ICD-10-CM | POA: Insufficient documentation

## 2020-04-21 DIAGNOSIS — R278 Other lack of coordination: Secondary | ICD-10-CM | POA: Insufficient documentation

## 2020-04-21 NOTE — Patient Instructions (Signed)
  Coordination Activities  Perform the following activities for 20 minutes 2 times per day with left hand(s).   Rotate ball in fingertips (clockwise and counter-clockwise).  Toss ball between hands.  Toss ball in air and catch with the same hand.  Flip cards 1 at a time as fast as you can.  Deal cards with your thumb (Hold deck in hand and push card off top with thumb).  Rotate card in hand (clockwise and counter-clockwise).  Shuffle cards.  Pick up coins, buttons, marbles, dried beans/pasta of different sizes and place in container.  Pick up coins and place in container or coin bank.  Pick up coins and stack.  Pick up coins one at a time until you get 5-10 in your hand, then move coins from palm to fingertips to stack one at a time.  Twirl pen between fingers.  Practice writing and/or typing.  Screw together nuts and bolts, then unfasten. 

## 2020-04-21 NOTE — Therapy (Signed)
[facility] [address] [place], [place], [address] Phone: [phone]   Fax:  [phone]  Occupational Therapy Evaluation  Patient Details  Name: Sandra Clay MRN: [id number] Date of Birth: [birth year] Referring Provider (OT): Dimas Chyle, MD   Encounter Date: 04/21/2020   OT End of Session - 04/21/20 1405    Visit Number 1    Number of Visits 7    Date for OT Re-Evaluation 06/02/20    Authorization Type [facility]    Authorization Time Period $20 copay [id number]    OT Start Time 1315    OT Stop Time 1400    OT Time Calculation (min) 45 min    Activity Tolerance Patient tolerated treatment well    Behavior During Therapy [facility] for tasks assessed/performed           Past Medical History:  Diagnosis Date  . Anxiety   . Cancer ([place])    skin cancer   . Cataract    left forming   . Depression    situational , resolved   . Headache(784.0)   . Osteoarthritis   . PONV (postoperative nausea and vomiting)   . Stroke [facility]) 03/2020    Past Surgical History:  Procedure Laterality Date  . BUNIONECTOMY    . CESAREAN SECTION     x3  . CHOLECYSTECTOMY    . COLONOSCOPY     ~51 ish per pt  bucini  . FRACTURE SURGERY    . KNEE ARTHROSCOPY W/ MENISCAL REPAIR Right    x2  . ROTATOR CUFF REPAIR  2018   right   . TOTAL HIP ARTHROPLASTY Left 08/03/2018   Procedure: TOTAL HIP ARTHROPLASTY ANTERIOR APPROACH;  Surgeon: Gaynelle Arabian, MD;  Location: [facility];  Service: Orthopedics;  Laterality: Left;  . TUBAL LIGATION    . tubal reversal      There were no vitals filed for this visit.   Subjective Assessment - 04/21/20 1414    Subjective  Pt reports independent prior to recent stroke on 04/08/20 and is independent with ADLs and IADLs at this time. Pt reports difficulty with typing and playing piano and overall fine motor coordination and strength in LUE. Pt reports bumping into door frames on left, dropping things and sometimes  closing doors on herself on the left.    Pertinent History PMH mild HLD, no hx of HTN but blood pressure readings high at time of stroke    Patient Stated Goals Typing and playing piano    Currently in Pain? No/denies             [facility] OT Assessment - 04/21/20 1320      Assessment   Medical Diagnosis CVA    Referring Provider (OT) Dimas Chyle, MD    Onset Date/Surgical Date 04/08/20    Hand Dominance Right    Prior Therapy No OT prior      Precautions   Precautions Fall      Balance Screen   Has the patient fallen in the past 6 months No      Home  Environment   Family/patient expects to be discharged to: Private residence    Living Arrangements Spouse/significant other   boyfriend   Available Help at Discharge Family    Type of [place] to live on main level with bedroom/bathroom    Bathroom Building control surveyor      Prior Function   Level of Independence Independent  Vocation Full time employment    Engineer, 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 No difficulty.      IADL   Prior Level of Function Shopping Pt is completing all IADLs with independence prior and now    Shopping Takes care of all shopping needs independently    Light Housekeeping Does personal laundry completely    Meal Prep Plans, prepares and serves adequate meals independently    Programmer, applications own vehicle    Medication Management Is responsible for taking medication in correct dosages at correct time    Physiological scientist financial matters independently (budgets, writes checks, pays rent, bills goes to bank), collects and keeps track of income      Written Expression   Dominant Hand Right   pt does not report any changes     Vision - History   Baseline Vision Wears glasses all the time   just for reading but  wears always     Vision Assessment   Diplopia Assessment Other (comment)   has since resolved   Comment no other visual deficits noted or reported      Cognition   Overall Cognitive Status Within Functional Limits for tasks assessed    Cognition Comments no deficits noted at eval. pt did not report any changes      Observation/Other Assessments   Focus on Therapeutic Outcomes (FOTO)  59      Sensation   Light Touch Appears Intact    Hot/Cold Appears Intact      Coordination   9 Hole Peg Test Right;Left    Right 9 Hole Peg Test 17.6    Left 9 Hole Peg Test 28.12      Perception   Perception Impaired    Inattention/Neglect Does not attend to left side of body      ROM / Strength   AROM / PROM / Strength AROM;Strength      AROM   Overall AROM  Within functional limits for tasks performed   R deficits d/t RTC tear previously     Strength   Overall Strength Deficits    Overall Strength Comments R grossly 4/5; L grossly 3+/5      Hand Function   Right Hand Gross Grasp Functional    Right Hand Grip (lbs) 73.8    Left Hand Gross Grasp Functional    Left Hand Grip (lbs) 52                             OT Short Term Goals - 04/21/20 1418      OT SHORT TERM GOAL #1   Title Pt will be independent with HEP 05/12/20    Time 3    Period Weeks    Status New    Target Date 05/12/20      OT SHORT TERM GOAL #2   Title Pt will report less drops from LUE during functional daily tasks.    Time 3    Period Weeks    Status New      OT SHORT TERM GOAL #3   Title Pt will complete tabletop scanning with 85% accuracy or greater for increase in attention to detail and left attention.    Baseline 65% accuracy    Time 3  Period Weeks    Status New      OT SHORT TERM GOAL #4   Title Pt will improve typing speed and accuracy to 35wpm with 50% or greater accuracy on typing test    Baseline 31 wpm 30% accuracy 9 wpm net speed    Time 3    Period Weeks    Status  New      OT SHORT TERM GOAL #5   Title Pt will report increased ease and coordination with playing piano.    Time 3    Period Weeks    Status New             OT Long Term Goals - 04/21/20 1421      OT LONG TERM GOAL #1   Title Pt will be independent with updated HEP 06/02/20    Time 6    Period Weeks    Status New    Target Date 06/02/20      OT LONG TERM GOAL #2   Title Pt will improve grip strength in LUE to 60 lbs or greater for increase in overall functional use for daily activities.    Baseline 52lbs    Time 6    Period Weeks    Status New      OT LONG TERM GOAL #3   Title Pt will perform environmental scanning with 100% accuracy or greater with added component (physical or cognitive)    Baseline 100% accuracy in min distracting environment    Time 6    Period Weeks    Status New      OT LONG TERM GOAL #4   Title Pt will complete FOTO and score 70% or higher    Baseline 59%    Time 6    Period Weeks    Status New      OT LONG TERM GOAL #5   Title Pt will increase 9 hole peg test score to 23 seconds or less with LUE for increase in fine motor coordination    Baseline 28.12s    Time 6    Period Weeks    Status New      OT LONG TERM GOAL #6   Title Pt will demonstrate improved typing speed to 45 wpm with 75% or greater accuracy on typing test    Baseline 31 wpm 30% accuracy (9wpm net speed)    Time 6    Period Weeks    Status New                 Plan - 04/21/20 1349    Clinical Impression Statement Pt is a 65 year old female with mild hyperlipidemia and no family history of stroke presenting with vertical diplopia new onset Saturday 04/08/20. Pt with MRI results of small late acute to subacute infarct of the right superior ventral  midbrain. Pt presents with LUE weakness, fine motor coordination impairments and decrease in LUE grip strength. Pt demonstrates some left inattention. Skilled occupational therapy is recommended to target listed areas of  deficit.    OT Occupational Profile and History Problem Focused Assessment - Including review of records relating to presenting problem    Occupational performance deficits (Please refer to evaluation for details): Leisure;Work;IADL's    Body Structure / Function / Physical Skills UE functional use;Decreased knowledge of use of DME;Dexterity;Vision;FMC;Coordination;Strength;GMC    Rehab Potential Good    Clinical Decision Making Limited treatment options, no task modification necessary    Comorbidities Affecting Occupational  Performance: None    Modification or Assistance to Complete Evaluation  No modification of tasks or assist necessary to complete eval    OT Frequency 1x / week    OT Duration 6 weeks    OT Treatment/Interventions Therapeutic exercise;Neuromuscular education;Therapeutic activities;DME and/or AE instruction;Patient/family education    Plan left handed typing words, LUE coordination and strengthening    OT Home Exercise Plan Coordination  HEP    Consulted and Agree with Plan of Care Patient           Patient will benefit from skilled therapeutic intervention in order to improve the following deficits and impairments:   Body Structure / Function / Physical Skills: UE functional use,Decreased knowledge of use of [place]       Visit Diagnosis: 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 Cerebrovascular accident (CVA) ([place]) 04/17/2020  . History of hip replacement 08/18/2018  . OA (osteoarthritis) of hip 08/03/2018  . Hyperglycemia 05/18/2018  . Skin lump of leg,  right 12/30/2017  . Genital herpes 12/30/2017    Zachery Conch MOT, OTR/L  04/21/2020, 2:29 PM  [facility] [address] [place], [place], [address] Phone: [phone]   Fax:  [phone]  Name: SEANA STELMA MRN: [id number] Date of Birth: 04-Sep-1955

## 2020-04-24 ENCOUNTER — Encounter: Payer: Self-pay | Admitting: Neurology

## 2020-04-24 ENCOUNTER — Other Ambulatory Visit: Payer: Self-pay

## 2020-04-24 ENCOUNTER — Ambulatory Visit: Payer: 59 | Admitting: Neurology

## 2020-04-24 VITALS — BP 150/80 | HR 83 | Ht 66.0 in | Wt 173.2 lb

## 2020-04-24 DIAGNOSIS — I6389 Other cerebral infarction: Secondary | ICD-10-CM

## 2020-04-24 DIAGNOSIS — R269 Unspecified abnormalities of gait and mobility: Secondary | ICD-10-CM | POA: Diagnosis not present

## 2020-04-24 DIAGNOSIS — H532 Diplopia: Secondary | ICD-10-CM

## 2020-04-24 NOTE — Patient Instructions (Signed)
I had a long discussion with the patient with regards to her recent brainstem infarct and diplopia and gait abnormality which appear to be improving.  I recommend she continue on aspirin and Plavix for 1 more week and then discontinue Plavix and stay on aspirin alone for stroke prevention and maintain aggressive risk factor modification with strict control of hypertension with blood pressure goal below 130/90, lipids with LDL cholesterol goal below 70 mg percent and diabetes with hemoglobin A1c goal below 6.5%.  Check lipid profile, hemoglobin A1c, HIV, RPR, anticardiolipin antibodies, ANA panel.  Check transcranial Doppler bubble study for PFO.  Refer to physical therapy for gait and balance training.  She will return for follow-up in the future in 3 months or call earlier if necessary.  Stroke Prevention Some medical conditions and behaviors are associated with a higher chance of having a stroke. You can help prevent a stroke by making nutrition, lifestyle, and other changes, including managing any medical conditions you may have. What nutrition changes can be made?  Eat healthy foods. You can do this by: ? Choosing foods high in fiber, such as fresh fruits and vegetables and whole grains. ? Eating at least 5 or more servings of fruits and vegetables a day. Try to fill half of your plate at each meal with fruits and vegetables. ? Choosing lean protein foods, such as lean cuts of meat, poultry without skin, fish, tofu, beans, and nuts. ? Eating low-fat dairy products. ? Avoiding foods that are high in salt (sodium). This can help lower blood pressure. ? Avoiding foods that have saturated fat, trans fat, and cholesterol. This can help prevent high cholesterol. ? Avoiding processed and premade foods.  Follow your health care provider's specific guidelines for losing weight, controlling high blood pressure (hypertension), lowering high cholesterol, and managing diabetes. These may include: ? Reducing  your daily calorie intake. ? Limiting your daily sodium intake to 1,500 milligrams (mg). ? Using only healthy fats for cooking, such as olive oil, canola oil, or sunflower oil. ? Counting your daily carbohydrate intake.   What lifestyle changes can be made?  Maintain a healthy weight. Talk to your health care provider about your ideal weight.  Get at least 30 minutes of moderate physical activity at least 5 days a week. Moderate activity includes brisk walking, biking, and swimming.  Do not use any products that contain nicotine or tobacco, such as cigarettes and e-cigarettes. If you need help quitting, ask your health care provider. It may also be helpful to avoid exposure to secondhand smoke.  Limit alcohol intake to no more than 1 drink a day for nonpregnant women and 2 drinks a day for men. One drink equals 12 oz of beer, 5 oz of wine, or 1 oz of hard liquor.  Stop any illegal drug use.  Avoid taking birth control pills. Talk to your health care provider about the risks of taking birth control pills if: ? You are over 10 years old. ? You smoke. ? You get migraines. ? You have ever had a blood clot. What other changes can be made?  Manage your cholesterol levels. ? Eating a healthy diet is important for preventing high cholesterol. If cholesterol cannot be managed through diet alone, you may also need to take medicines. ? Take any prescribed medicines to control your cholesterol as told by your health care provider.  Manage your diabetes. ? Eating a healthy diet and exercising regularly are important parts of managing your blood sugar. If  your blood sugar cannot be managed through diet and exercise, you may need to take medicines. ? Take any prescribed medicines to control your diabetes as told by your health care provider.  Control your hypertension. ? To reduce your risk of stroke, try to keep your blood pressure below 130/80. ? Eating a healthy diet and exercising regularly  are an important part of controlling your blood pressure. If your blood pressure cannot be managed through diet and exercise, you may need to take medicines. ? Take any prescribed medicines to control hypertension as told by your health care provider. ? Ask your health care provider if you should monitor your blood pressure at home. ? Have your blood pressure checked every year, even if your blood pressure is normal. Blood pressure increases with age and some medical conditions.  Get evaluated for sleep disorders (sleep apnea). Talk to your health care provider about getting a sleep evaluation if you snore a lot or have excessive sleepiness.  Take over-the-counter and prescription medicines only as told by your health care provider. Aspirin or blood thinners (antiplatelets or anticoagulants) may be recommended to reduce your risk of forming blood clots that can lead to stroke.  Make sure that any other medical conditions you have, such as atrial fibrillation or atherosclerosis, are managed. What are the warning signs of a stroke? The warning signs of a stroke can be easily remembered as BEFAST.  B is for balance. Signs include: ? Dizziness. ? Loss of balance or coordination. ? Sudden trouble walking.  E is for eyes. Signs include: ? A sudden change in vision. ? Trouble seeing.  F is for face. Signs include: ? Sudden weakness or numbness of the face. ? The face or eyelid drooping to one side.  A is for arms. Signs include: ? Sudden weakness or numbness of the arm, usually on one side of the body.  S is for speech. Signs include: ? Trouble speaking (aphasia). ? Trouble understanding.  T is for time. ? These symptoms may represent a serious problem that is an emergency. Do not wait to see if the symptoms will go away. Get medical help right away. Call your local emergency services (911 in the [place] Do not drive yourself to the hospital.  Other signs of stroke may include: ? A  sudden, severe headache with no known cause. ? Nausea or vomiting. ? Seizure. Where to find more information For more information, visit:  American Stroke Association: www.strokeassociation.org  National Stroke Association: www.stroke.org Summary  You can prevent a stroke by eating healthy, exercising, not smoking, limiting alcohol intake, and managing any medical conditions you may have.  Do not use any products that contain nicotine or tobacco, such as cigarettes and e-cigarettes. If you need help quitting, ask your health care provider. It may also be helpful to avoid exposure to secondhand smoke.  Remember BEFAST for warning signs of stroke. Get help right away if you or a loved one has any of these signs. This information is not intended to replace advice given to you by your health care provider. Make sure you discuss any questions you have with your health care provider. Document Revised: 02/14/2017 Document Reviewed: 04/09/2016 Elsevier Patient Education  2021 [employer].

## 2020-04-24 NOTE — Progress Notes (Signed)
[facility] [address] [place]. [place] [address] [phone]       OFFICE CONSULT NOTE  [name] Date of Birth:  1956-01-01 Medical Record Number:  [id number]   Referring MD: Garret Reddish  Reason for Referral: Stroke  HPI: [name] is a 65 year old pleasant Caucasian lady seen today for initial office consultation visit for stroke.  History is obtained on the patient, review of electronic medical records and I personally reviewed imaging films in PACS.  [name] is a pleasant middle-aged Caucasian lady who 2 weeks ago developed sudden onset of vertical double vision.  She states this was binocular.  She felt off balance as if she had had multiple drinks and was leaning to the left.  She complained of nausea but had no vomiting.  She try to walk her dog and had great difficulty doing so because of her double vision and imbalance.  She had recent eye surgery and thought this was related to that and she called her eye doctor who saw her the next day and did the present test and noted that the right eye was slightly hypertrophic.  She had a CT scan of the head on 04/10/2020 which was unremarkable.  Her symptoms improved the next day.  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  She subsequently had outpatient MRI of the neck and brain done on 04/18/2020 both of which did not show any large vessel stenosis or occlusion.  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  She has a 2D echo ordered which is pending for tomorrow.  She is currently doing outpatient occupational therapy but has not gotten any physical therapy but does admit that her balance is off and she has problems with the coordination of left leg.  She was started on aspirin and Plavix and Crestor which she is tolerating well but does get a lot  of bruising on Plavix.  Patient informs me that a week before her onset of her stroke symptoms she had undergone lower extremity vein sclerotherapy procedure.  She denies any prior history of DVT or known history of patent foramen ovale.  There is no prior history of palpitations, syncope, cardiac problems.  There is no family Struve strokes.  Patient does have previous migraine headaches but no seizures or other neurological problems.  She has no significant vascular risk factors except mild hyperlipidemia and LDL cholesterol in June 2021 was 106 mg percent  ROS:   14 system review of systems is positive for bruising, diminished fine motor skills, imbalance, incoordination all other systems negative PMH:  Past Medical History:  Diagnosis Date  . Anxiety   . Cancer ([place])    skin cancer   . Cataract    left forming   . Depression    situational , resolved   . Headache(784.0)   . Osteoarthritis   . PONV (postoperative nausea and vomiting)   . Stroke [facility]) 03/2020    Social History:  Social History   Socioeconomic History  . Marital status: Single    Spouse name: Not on file  . Number of children: Not on file  . Years of education: Not on file  . Highest education level: Not on file  Occupational History  . Occupation: Full time  Tobacco Use  . Smoking status: Former Research scientist (life sciences)  . Smokeless tobacco:  Never Used  Substance and Sexual Activity  . Alcohol use: Yes    Alcohol/week: 1.0 - 2.0 standard drink    Types: 1 - 2 Glasses of wine per week    Comment: one to two glasses of wine per day   . Drug use: No  . Sexual activity: Yes    Birth control/protection: Post-menopausal  Other Topics Concern  . Not on file  Social History Narrative   Lives with significant other, Eulas Post.   Right Handed   Drinks 2-3 cups caffeine daily   Social Determinants of Health   Financial Resource Strain: Not on file  Food Insecurity: Not on file  Transportation Needs: Not on file  Physical  Activity: Not on file  Stress: Not on file  Social Connections: Not on file  Intimate Partner Violence: Not on file    Medications:   Current Outpatient Medications on File Prior to Visit  Medication Sig Dispense Refill  . aspirin EC 81 MG tablet Take 81 mg by mouth daily. Swallow whole.    . clopidogrel (PLAVIX) 75 MG tablet Take 1 tablet (75 mg total) by mouth daily. 30 tablet 0  . diazepam (VALIUM) 5 MG tablet Take 0.5-1 tablets (2.5-5 mg total) by mouth every 12 (twelve) hours as needed for anxiety. 30 tablet 1  . Doxylamine Succinate, Sleep, (UNISOM PO) Take by mouth.    . rosuvastatin (CRESTOR) 20 MG tablet Take 1 tablet (20 mg total) by mouth daily. 90 tablet 3  . valACYclovir (VALTREX) 1000 MG tablet Take 1 g by mouth daily.  12  . VITAMIN D PO Take by mouth.     No current facility-administered medications on file prior to visit.    Allergies:   Allergies  Allergen Reactions  . Dilaudid [Hydromorphone] Itching  . Sulfa Antibiotics Rash    Physical Exam General: well developed, well nourished middle-aged Caucasian lady, seated, in no evident distress Head: head normocephalic and atraumatic.   Neck: supple with no carotid or supraclavicular bruits Cardiovascular: regular rate and rhythm, no murmurs Musculoskeletal: no deformity Skin:  no rash but bruising noted in the left forearm Vascular:  Normal pulses all extremities  Neurologic Exam Mental Status: Awake and fully alert. Oriented to place and time. Recent and remote memory intact. Attention span, concentration and fund of knowledge appropriate. Mood and affect appropriate.  Cranial Nerves: Fundoscopic exam reveals sharp disc margins. Pupils equal, briskly reactive to light. Extraocular movements full without nystagmus. Visual fields full to confrontation. Hearing intact. Facial sensation intact. Face, tongue, palate moves normally and symmetrically.  Motor: Normal bulk and tone. Normal strength in all tested  extremity muscles mild diminished fine finger movements on the left.  Orbits right over left upper extremity.  Mild left grip weakness.. Sensory.: intact to touch , pinprick , position and vibratory sensation.  Coordination: Rapid alternating movements normal in all extremities. Finger-to-nose and heel-to-shin performed accurately bilaterally. Gait and Station: Arises from chair without difficulty. Stance is normal. Gait demonstrates normal stride length and balance . Able to heel, toe and tandem walk with moderate difficulty.  Reflexes: 1+ and symmetric. Toes downgoing.   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  Vascular risk factors of mild hyperlipidemia and age only.     PLAN: I had a long discussion with the patient with regards to her recent brainstem infarct  and diplopia and gait abnormality which appear to be improving.  I recommend she continue on aspirin and Plavix for 1 more week and then discontinue Plavix and stay on aspirin alone for stroke prevention and maintain aggressive risk factor modification with strict control of hypertension with blood pressure goal below 130/90, lipids with LDL cholesterol goal below 70 mg percent and diabetes with hemoglobin A1c goal below 6.5%.  Check lipid profile, hemoglobin A1c, HIV, RPR, anticardiolipin antibodies, ANA panel.  Check transcranial Doppler bubble study for PFO as interestingly her stroke happened 1 week after undergoing lower extremity venous sclerotherapy procedure..  Refer to physical therapy for gait and balance training.  She will return for follow-up in the future in 3 months or call earlier if necessary.  Greater than 50% time during this 45-minute consultation visit was spent on counseling and coordination of care about her brainstem infarct and diplopia and discussion about  stroke prevention and treatment and answering questions. Antony Contras, MD  [facility] [address] [place], [place] [address]  Phone [phone] Fax [phone] Note: This document was prepared with digital dictation and possible smart phrase technology. Any transcriptional errors that result from this process are unintentional.

## 2020-04-25 ENCOUNTER — Ambulatory Visit: Payer: 59 | Admitting: Occupational Therapy

## 2020-04-25 ENCOUNTER — Other Ambulatory Visit: Payer: Self-pay

## 2020-04-25 DIAGNOSIS — R278 Other lack of coordination: Secondary | ICD-10-CM

## 2020-04-25 DIAGNOSIS — R2681 Unsteadiness on feet: Secondary | ICD-10-CM | POA: Diagnosis not present

## 2020-04-25 DIAGNOSIS — I69854 Hemiplegia and hemiparesis following other cerebrovascular disease affecting left non-dominant side: Secondary | ICD-10-CM | POA: Diagnosis not present

## 2020-04-25 DIAGNOSIS — R41842 Visuospatial deficit: Secondary | ICD-10-CM

## 2020-04-25 DIAGNOSIS — R2689 Other abnormalities of gait and mobility: Secondary | ICD-10-CM | POA: Diagnosis not present

## 2020-04-25 DIAGNOSIS — I639 Cerebral infarction, unspecified: Secondary | ICD-10-CM | POA: Diagnosis not present

## 2020-04-25 DIAGNOSIS — M6281 Muscle weakness (generalized): Secondary | ICD-10-CM | POA: Diagnosis not present

## 2020-04-25 LAB — LIPID PANEL
Chol/HDL Ratio: 2.1 ratio (ref 0.0–4.4)
Cholesterol, Total: 145 mg/dL (ref 100–199)
HDL: 68 mg/dL (ref >39)
LDL Chol Calc (NIH): 43 mg/dL (ref 0–99)
Triglycerides: 220 mg/dL — ABNORMAL HIGH (ref 0–149)
VLDL Cholesterol Cal: 34 mg/dL (ref 5–40)

## 2020-04-25 LAB — RPR: RPR Ser Ql: NONREACTIVE

## 2020-04-25 LAB — ANA COMPREHENSIVE PANEL
Anti JO-1: <0.2 AI (ref 0.0–0.9)
Centromere Ab Screen: <0.2 AI (ref 0.0–0.9)
Chromatin Ab SerPl-aCnc: <0.2 AI (ref 0.0–0.9)
ENA RNP Ab: <0.2 AI (ref 0.0–0.9)
ENA SM Ab Ser-aCnc: <0.2 AI (ref 0.0–0.9)
ENA SSA (RO) Ab: <0.2 AI (ref 0.0–0.9)
ENA SSB (LA) Ab: <0.2 AI (ref 0.0–0.9)
Scleroderma (Scl-70) (ENA) Antibody, IgG: <0.2 AI (ref 0.0–0.9)
dsDNA Ab: 1 IU/mL (ref 0–9)

## 2020-04-25 LAB — CARDIOLIPIN ANTIBODIES, IGG, IGM, IGA
Anticardiolipin IgA: <9 APL U/mL (ref 0–11)
Anticardiolipin IgG: <9 GPL U/mL (ref 0–14)
Anticardiolipin IgM: <9 MPL U/mL (ref 0–12)

## 2020-04-25 LAB — HEMOGLOBIN A1C
Est. average glucose Bld gHb Est-mCnc: 117 mg/dL
Hgb A1c MFr Bld: 5.7 % — ABNORMAL HIGH (ref 4.8–5.6)

## 2020-04-25 LAB — HIV ANTIBODY (ROUTINE TESTING W REFLEX): HIV Screen 4th Generation wRfx: NONREACTIVE

## 2020-04-25 NOTE — Progress Notes (Signed)
Kindly inform the patient that screening test for diabetes, HIV, syphilis and lupus were all fine.  Cholesterol profile was mostly okay except triglycerides were elevated and ask her to see primary care physician for advice for treatment for the same

## 2020-04-25 NOTE — Patient Instructions (Addendum)
    Hand Exercises  Perform the following activities for 15-20 minutes 1-2 times per day with left hand(s).   Rotate ball in fingertips (clockwise and counter-clockwise).  Toss ball in air and catch with the same hand (left).  Rotate 2 golf balls in your hand--both directions  Flip cards 1 at a time as fast as you can.  Deal cards with your thumb (Hold deck in hand and push card off top with thumb).  Flip cards between each finger and back  Shuffle cards.  Pick up coins and stack.  Pick up coins one at a time until you get 5-10 in your hand, then move coins from palm to fingertips to place in container or coin bank one at a time.  Pick up coins with each finger and thumb and place in container.  Practice typing--Left handed words, google "free typing test" or "free typing games" to practice  Continue to practice scales, chords when playing piano  Squeeze green putty with your whole hand 20x  Roll out green putty into a log, then pinch with each finger and thumb down length, 3x

## 2020-04-25 NOTE — Therapy (Signed)
[facility] [address] [place], [place], [address] Phone: [phone]   Fax:  [phone]  Occupational Therapy Treatment  Patient Details  Name: Sandra Clay MRN: [id number] Date of Birth: 1955/10/28 Referring Provider (OT): Dimas Chyle, MD   Encounter Date: 04/25/2020   OT End of Session - 04/25/20 0852    Visit Number 2    Number of Visits 7    Date for OT Re-Evaluation 06/02/20    Authorization Type [facility]    Authorization Time Period $20 copay VL:[id number]    OT Start Time 0851    OT Stop Time 0930    OT Time Calculation (min) 39 min    Activity Tolerance Patient tolerated treatment well    Behavior During Therapy [facility] for tasks assessed/performed           Past Medical History:  Diagnosis Date  . Anxiety   . Cancer ([place])    skin cancer   . Cataract    left forming   . Depression    situational , resolved   . Headache(784.0)   . Osteoarthritis   . PONV (postoperative nausea and vomiting)   . Stroke [facility]) 03/2020    Past Surgical History:  Procedure Laterality Date  . BUNIONECTOMY    . CESAREAN SECTION     x3  . CHOLECYSTECTOMY    . COLONOSCOPY     ~51 ish per pt  bucini  . FRACTURE SURGERY    . KNEE ARTHROSCOPY W/ MENISCAL REPAIR Right    x2  . ROTATOR CUFF REPAIR  2018   right   . TOTAL HIP ARTHROPLASTY Left 08/03/2018   Procedure: TOTAL HIP ARTHROPLASTY ANTERIOR APPROACH;  Surgeon: Gaynelle Arabian, MD;  Location: [facility];  Service: Orthopedics;  Laterality: Left;  . TUBAL LIGATION    . tubal reversal      There were no vitals filed for this visit.   Subjective Assessment - 04/25/20 0852    Subjective  "Hand is about the same.  Pt reports "sea sickness" notices it more when leaning/bending over."  Pt reports that she no longer notices diplopia, but that Dr. Leonie Man still noticed that eyes weren't moving together.    Pertinent History PMH mild HLD, no hx of HTN but blood pressure readings high  at time of stroke    Patient Stated Goals Typing and playing piano    Currently in Pain? No/denies             Typing test with 27% accuracy, 12wpm, 3 wpm net speed  Then practiced with L-handed typing words with approx 85-90% accuracy.  Recommended that pt slow down with typing/activities with L hand and incr attention to LUE (pt reports drops).  Pt verbalized understanding.     OT Education - 04/25/20 1057    Education Details Coordination and Green Putty HEP--see pt instructions;  L-handed typing words    Person(s) Educated Patient    Methods Explanation;Demonstration;Verbal cues;Handout    Comprehension Verbalized understanding;Returned demonstration;Verbal cues required            OT Short Term Goals - 04/21/20 1418      OT SHORT TERM GOAL #1   Title Pt will be independent with HEP 05/12/20    Time 3    Period Weeks    Status New    Target Date 05/12/20      OT SHORT TERM GOAL #2   Title Pt will report less drops from LUE during  functional daily tasks.    Time 3    Period Weeks    Status New      OT SHORT TERM GOAL #3   Title Pt will complete tabletop scanning with 85% accuracy or greater for increase in attention to detail and left attention.    Baseline 65% accuracy    Time 3    Period Weeks    Status New      OT SHORT TERM GOAL #4   Title Pt will improve typing speed and accuracy to 35wpm with 50% or greater accuracy on typing test    Baseline 31 wpm 30% accuracy 9 wpm net speed    Time 3    Period Weeks    Status New      OT SHORT TERM GOAL #5   Title Pt will report increased ease and coordination with playing piano.    Time 3    Period Weeks    Status New             OT Long Term Goals - 04/21/20 1421      OT LONG TERM GOAL #1   Title Pt will be independent with updated HEP 06/02/20    Time 6    Period Weeks    Status New    Target Date 06/02/20      OT LONG TERM GOAL #2   Title Pt will improve grip strength in LUE to 60 lbs or  greater for increase in overall functional use for daily activities.    Baseline 52lbs    Time 6    Period Weeks    Status New      OT LONG TERM GOAL #3   Title Pt will perform environmental scanning with 100% accuracy or greater with added component (physical or cognitive)    Baseline 100% accuracy in min distracting environment    Time 6    Period Weeks    Status New      OT LONG TERM GOAL #4   Title Pt will complete FOTO and score 70% or higher    Baseline 59%    Time 6    Period Weeks    Status New      OT LONG TERM GOAL #5   Title Pt will increase 9 hole peg test score to 23 seconds or less with LUE for increase in fine motor coordination    Baseline 28.12s    Time 6    Period Weeks    Status New      OT LONG TERM GOAL #6   Title Pt will demonstrate improved typing speed to 45 wpm with 75% or greater accuracy on typing test    Baseline 31 wpm 30% accuracy (9wpm net speed)    Time 6    Period Weeks    Status New                 Plan - 04/25/20 1275    Clinical Impression Statement Pt verbalizes understanding of initial HEP and returned demo.    OT Occupational Profile and History Problem Focused Assessment - Including review of records relating to presenting problem    Occupational performance deficits (Please refer to evaluation for details): Leisure;Work;IADL's    Body Structure / Function / Physical Skills UE functional use;Decreased knowledge of use of DME;Dexterity;Vision;FMC;Coordination;Strength;GMC    Rehab Potential Good    Clinical Decision Making Limited treatment options, no task modification necessary    Comorbidities Affecting Occupational Performance:  None    Modification or Assistance to Complete Evaluation  No modification of tasks or assist necessary to complete eval    OT Frequency 1x / week    OT Duration 6 weeks    OT Treatment/Interventions Therapeutic exercise;Neuromuscular education;Therapeutic activities;DME and/or AE  instruction;Patient/family education    Plan LUE proximal strengthening HEP, environmental scanning    OT Home Exercise Plan Coordination  HEP    Consulted and Agree with Plan of Care Patient           Patient will benefit from skilled therapeutic intervention in order to improve the following deficits and impairments:   Body Structure / Function / Physical Skills: UE functional use,Decreased knowledge of use of [place]       Visit Diagnosis: 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 Cerebrovascular accident (CVA) ([place]) 04/17/2020  . History of hip replacement 08/18/2018  . OA (osteoarthritis) of hip 08/03/2018  . Hyperglycemia 05/18/2018  . Skin lump of leg, right 12/30/2017  . Genital herpes 12/30/2017    [facility] 04/25/2020, 10:59 AM  [facility] [address] [place], [place], [address] Phone: [phone]   Fax:  [phone]  Name: Sandra Clay MRN: [id number] Date of Birth: December 04, 1955   Vianne Bulls, OTR/L [facility] [address] [place], [place]  [address] [phone] phone [phone] 04/25/20 11:00 AM

## 2020-04-26 ENCOUNTER — Ambulatory Visit (HOSPITAL_COMMUNITY): Payer: 59 | Attending: Cardiovascular Disease

## 2020-04-26 DIAGNOSIS — Z8673 Personal history of transient ischemic attack (TIA), and cerebral infarction without residual deficits: Secondary | ICD-10-CM | POA: Diagnosis not present

## 2020-04-26 LAB — ECHOCARDIOGRAM COMPLETE
Area-P 1/2: 3.2 cm2
S' Lateral: 3 cm

## 2020-04-27 ENCOUNTER — Encounter: Payer: Self-pay | Admitting: *Deleted

## 2020-04-27 ENCOUNTER — Encounter: Payer: Self-pay | Admitting: Family Medicine

## 2020-05-01 ENCOUNTER — Ambulatory Visit (HOSPITAL_COMMUNITY)
Admission: RE | Admit: 2020-05-01 | Discharge: 2020-05-01 | Disposition: A | Discharge status: Home / Self Care | Payer: 59 | Source: Ambulatory Visit | Attending: Neurology | Admitting: Neurology

## 2020-05-01 ENCOUNTER — Ambulatory Visit (HOSPITAL_BASED_OUTPATIENT_CLINIC_OR_DEPARTMENT_OTHER)
Admission: RE | Admit: 2020-05-01 | Discharge: 2020-05-01 | Disposition: A | Discharge status: Home / Self Care | Payer: 59 | Source: Ambulatory Visit | Attending: Neurology | Admitting: Neurology

## 2020-05-01 ENCOUNTER — Other Ambulatory Visit: Payer: Self-pay | Admitting: Neurology

## 2020-05-01 ENCOUNTER — Ambulatory Visit: Payer: 59 | Admitting: Occupational Therapy

## 2020-05-01 ENCOUNTER — Other Ambulatory Visit: Payer: Self-pay

## 2020-05-01 DIAGNOSIS — R2681 Unsteadiness on feet: Secondary | ICD-10-CM | POA: Diagnosis not present

## 2020-05-01 DIAGNOSIS — I639 Cerebral infarction, unspecified: Secondary | ICD-10-CM | POA: Diagnosis not present

## 2020-05-01 DIAGNOSIS — R41842 Visuospatial deficit: Secondary | ICD-10-CM | POA: Diagnosis not present

## 2020-05-01 DIAGNOSIS — M6281 Muscle weakness (generalized): Secondary | ICD-10-CM

## 2020-05-01 DIAGNOSIS — I6389 Other cerebral infarction: Secondary | ICD-10-CM | POA: Insufficient documentation

## 2020-05-01 DIAGNOSIS — R278 Other lack of coordination: Secondary | ICD-10-CM | POA: Diagnosis not present

## 2020-05-01 DIAGNOSIS — I82403 Acute embolism and thrombosis of unspecified deep veins of lower extremity, bilateral: Secondary | ICD-10-CM | POA: Insufficient documentation

## 2020-05-01 DIAGNOSIS — I69854 Hemiplegia and hemiparesis following other cerebrovascular disease affecting left non-dominant side: Secondary | ICD-10-CM

## 2020-05-01 DIAGNOSIS — R2689 Other abnormalities of gait and mobility: Secondary | ICD-10-CM | POA: Diagnosis not present

## 2020-05-01 NOTE — Therapy (Signed)
[facility] [address] [place], [place], [address] Phone: [phone]   Fax:  [phone]  Occupational Therapy Treatment  Patient Details  Name: Sandra Clay MRN: [id number] Date of Birth: Jul 11, 1955 Referring Provider (OT): Dimas Chyle, MD   Encounter Date: 05/01/2020   OT End of Session - 05/01/20 1622    Visit Number 3    Number of Visits 7    Date for OT Re-Evaluation 06/02/20    Authorization Type [facility]    Authorization Time Period $20 copay VL:[id number]    OT Start Time 1618    OT Stop Time 1656    OT Time Calculation (min) 38 min    Activity Tolerance Patient tolerated treatment well    Behavior During Therapy [facility] for tasks assessed/performed           Past Medical History:  Diagnosis Date  . Anxiety   . Cancer ([place])    skin cancer   . Cataract    left forming   . Depression    situational , resolved   . Headache(784.0)   . Osteoarthritis   . PONV (postoperative nausea and vomiting)   . Stroke [facility]) 03/2020    Past Surgical History:  Procedure Laterality Date  . BUNIONECTOMY    . CESAREAN SECTION     x3  . CHOLECYSTECTOMY    . COLONOSCOPY     ~51 ish per pt  bucini  . FRACTURE SURGERY    . KNEE ARTHROSCOPY W/ MENISCAL REPAIR Right    x2  . ROTATOR CUFF REPAIR  2018   right   . TOTAL HIP ARTHROPLASTY Left 08/03/2018   Procedure: TOTAL HIP ARTHROPLASTY ANTERIOR APPROACH;  Surgeon: Gaynelle Arabian, MD;  Location: [facility];  Service: Orthopedics;  Laterality: Left;  . TUBAL LIGATION    . tubal reversal      There were no vitals filed for this visit.   Subjective Assessment - 05/01/20 1623    Subjective  "I got cards, and pennies and balls" Pt reports things are getting better she thinks.    Pertinent History PMH mild HLD, no hx of HTN but blood pressure readings high at time of stroke    Patient Stated Goals Typing and playing piano    Currently in Pain? No/denies                         OT Treatments/Exercises (OP) - 05/01/20 1625      Exercises   Exercises Hand      Cognitive Exercises   Keyboarding clouds and word tris on Typing Master to work on accuracy and speed with typing and coordination. Typing Test with 34 wpm and 84% accuracy - Net speed 29 wpm      Hand Exercises   Other Hand Exercises hand gripper level 3 with LUE and picking up 1 inch blocks - min difficulty and min drops at end of activity secondary to fatigue    Other Hand Exercises resistance clothespins 1-8# with LUE and placed on antenna      Visual/Perceptual Exercises   Scanning Environmental    Scanning - Environmental 12/13 with 92% accuracy with cognitive component of doing in sequential order. Pt required increased time      Fine Motor Coordination (Hand/Wrist)   Fine Motor Coordination Grooved pegs    Grooved pegs with LUE with min difficulty and no drops  OT Short Term Goals - 05/01/20 1627      OT SHORT TERM GOAL #1   Title Pt will be independent with HEP 05/12/20    Time 3    Period Weeks    Status On-going    Target Date 05/12/20      OT SHORT TERM GOAL #2   Title Pt will report less drops from LUE during functional daily tasks.    Time 3    Period Weeks    Status On-going      OT SHORT TERM GOAL #3   Title Pt will complete tabletop scanning with 85% accuracy or greater for increase in attention to detail and left attention.    Baseline 65% accuracy    Time 3    Period Weeks    Status New      OT SHORT TERM GOAL #4   Title Pt will improve typing speed and accuracy to 35wpm with 50% or greater accuracy on typing test    Baseline 31 wpm 30% accuracy 9 wpm net speed    Time 3    Period Weeks    Status On-going      OT SHORT TERM GOAL #5   Title Pt will report increased ease and coordination with playing piano.    Time 3    Period Weeks    Status On-going   says practicing but it hasn't gotten much better             OT Long Term Goals - 05/01/20 1633      OT LONG TERM GOAL #1   Title Pt will be independent with updated HEP 06/02/20    Time 6    Period Weeks    Status New      OT LONG TERM GOAL #2   Title Pt will improve grip strength in LUE to 60 lbs or greater for increase in overall functional use for daily activities.    Baseline 52lbs    Time 6    Period Weeks    Status On-going   05/01/20 52.4 lbs     OT LONG TERM GOAL #3   Title Pt will perform environmental scanning with 100% accuracy or greater with added component (physical or cognitive)    Baseline 100% accuracy in min distracting environment    Time 6    Period Weeks    Status New      OT LONG TERM GOAL #4   Title Pt will complete FOTO and score 70% or higher    Baseline 59%    Time 6    Period Weeks    Status New      OT LONG TERM GOAL #5   Title Pt will increase 9 hole peg test score to 23 seconds or less with LUE for increase in fine motor coordination    Baseline 28.12s    Time 6    Period Weeks    Status On-going      OT LONG TERM GOAL #6   Title Pt will demonstrate improved typing speed to 45 wpm with 75% or greater accuracy on typing test    Baseline 31 wpm 30% accuracy (9wpm net speed)    Time 6    Period Weeks    Status New                 Plan - 05/01/20 1635    Clinical Impression Statement Pt motivated to participate in therapy. Pt continuing to progress  towards goals.    OT Occupational Profile and History Problem Focused Assessment - Including review of records relating to presenting problem    Occupational performance deficits (Please refer to evaluation for details): Leisure;Work;IADL's    Body Structure / Function / Physical Skills UE functional use;Decreased knowledge of use of DME;Dexterity;Vision;FMC;Coordination;Strength;GMC    Rehab Potential Good    Clinical Decision Making Limited treatment options, no task modification necessary    Comorbidities Affecting Occupational  Performance: None    Modification or Assistance to Complete Evaluation  No modification of tasks or assist necessary to complete eval    OT Frequency 1x / week    OT Duration 6 weeks    OT Treatment/Interventions Therapeutic exercise;Neuromuscular education;Therapeutic activities;DME and/or AE instruction;Patient/family education    Plan LUE proximal strengthening HEP, environmental scanning    OT Home Exercise Plan Coordination  HEP    Consulted and Agree with Plan of Care Patient           Patient will benefit from skilled therapeutic intervention in order to improve the following deficits and impairments:   Body Structure / Function / Physical Skills: UE functional use,Decreased knowledge of use of [place]       Visit Diagnosis: 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 Cerebrovascular accident (CVA) ([place]) 04/17/2020  . History of hip replacement 08/18/2018  . OA (osteoarthritis) of hip 08/03/2018  . Hyperglycemia 05/18/2018  . Skin lump of leg, right 12/30/2017  . Genital herpes 12/30/2017    Zachery Conch MOT, OTR/L  05/01/2020, 5:40 PM  [facility] [address] [place], [place], [address] Phone: [phone]   Fax:  [phone]  Name: Sandra Clay MRN: [id number] Date of Birth: Sep 16, 1955

## 2020-05-01 NOTE — Progress Notes (Signed)
TCD with bubbles and bilateral lower extremity venous duplexes has been completed. Preliminary results can be found in CV Proc through chart review.  Results were given to Dr. Leonie Man.  05/01/20 2:38 PM Sandra Clay RVT

## 2020-05-02 ENCOUNTER — Ambulatory Visit: Payer: 59 | Admitting: Physical Therapy

## 2020-05-02 ENCOUNTER — Encounter: Payer: Self-pay | Admitting: Physical Therapy

## 2020-05-02 ENCOUNTER — Other Ambulatory Visit: Payer: Self-pay | Admitting: *Deleted

## 2020-05-02 VITALS — BP 153/78 | HR 72

## 2020-05-02 DIAGNOSIS — M6281 Muscle weakness (generalized): Secondary | ICD-10-CM | POA: Diagnosis not present

## 2020-05-02 DIAGNOSIS — R2681 Unsteadiness on feet: Secondary | ICD-10-CM | POA: Diagnosis not present

## 2020-05-02 DIAGNOSIS — I639 Cerebral infarction, unspecified: Secondary | ICD-10-CM | POA: Diagnosis not present

## 2020-05-02 DIAGNOSIS — R2689 Other abnormalities of gait and mobility: Secondary | ICD-10-CM

## 2020-05-02 DIAGNOSIS — I69854 Hemiplegia and hemiparesis following other cerebrovascular disease affecting left non-dominant side: Secondary | ICD-10-CM | POA: Diagnosis not present

## 2020-05-02 DIAGNOSIS — R41842 Visuospatial deficit: Secondary | ICD-10-CM | POA: Diagnosis not present

## 2020-05-02 DIAGNOSIS — R278 Other lack of coordination: Secondary | ICD-10-CM | POA: Diagnosis not present

## 2020-05-02 NOTE — Therapy (Signed)
[facility] [address] [place], [place], [address] Phone: [phone]   Fax:  [phone]  Physical Therapy Evaluation  Patient Details  Name: Sandra Clay MRN: [id number] Date of Birth: [birth year] Referring Provider (PT): Leonie Man   Encounter Date: 05/02/2020   PT End of Session - 05/02/20 0941    Visit Number 1    Number of Visits 9    Authorization Type [facility]    PT Start Time [phone]    PT Stop Time 0932    PT Time Calculation (min) 46 min    Activity Tolerance Patient tolerated treatment well    Behavior During Therapy [facility] for tasks assessed/performed           Past Medical History:  Diagnosis Date  . Anxiety   . Cancer ([place])    skin cancer   . Cataract    left forming   . Depression    situational , resolved   . Headache(784.0)   . Osteoarthritis   . PONV (postoperative nausea and vomiting)   . Stroke [facility]) 03/2020    Past Surgical History:  Procedure Laterality Date  . BUNIONECTOMY    . CESAREAN SECTION     x3  . CHOLECYSTECTOMY    . COLONOSCOPY     ~51 ish per pt  bucini  . FRACTURE SURGERY    . KNEE ARTHROSCOPY W/ MENISCAL REPAIR Right    x2  . ROTATOR CUFF REPAIR  2018   right   . TOTAL HIP ARTHROPLASTY Left 08/03/2018   Procedure: TOTAL HIP ARTHROPLASTY ANTERIOR APPROACH;  Surgeon: Gaynelle Arabian, MD;  [facility]: [facility];  Service: Orthopedics;  Laterality: Left;  . TUBAL LIGATION    . tubal reversal      Vitals:   05/02/20 0905  BP: (!) 153/78  Pulse: 72      Subjective Assessment - 05/02/20 0849    Subjective Pt reports having stroke midbrain about 3 weeks ago.  Some trouble with my L side and then saw Dr. Leonie Man, who said my balance was way off.  I've almost fallen a few times; run into doorjams on L side.  Feel like my left foot catches at times.  No numbness or tingling; the visual changes have mostly resolved.  Will sometimes get nauseous with bending over and sometimes  with head motions.    Diagnostic tests MRI; recent imaging shows PFO, per pt report    Patient Stated Goals Pt's goals for PT are to improve knowledge for being safer with balance; don't want to fall.    Currently in Pain? No/denies              [facility] PT Assessment - 05/02/20 4174      Assessment   Medical Diagnosis CVA    Referring Provider (PT) Leonie Man    Onset Date/Surgical Date 04/08/20    Hand Dominance Right    Prior Therapy Previous PT for R RTC      Precautions   Precautions Fall      Balance Screen   Has the patient fallen in the past 6 months No    Has the patient had a decrease in activity level because of a fear of falling?  No   overall more cautious   Is the patient reluctant to leave their home because of a fear of falling?  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    Leisure plays piano, works in the yard      Observation/Other Assessments   Focus on Therapeutic Outcomes (FOTO)  59      Sensation   Light Touch Appears Intact    Hot/Cold Appears Intact      Posture/Postural Control   Posture/Postural Control No significant limitations      ROM / Strength   AROM / PROM / Strength AROM;Strength      AROM   Overall AROM  Within functional limits for tasks performed    Overall AROM Comments for BLEs      Strength   Overall Strength Deficits    Strength Assessment Site Hip;Knee;Ankle    Right/Left Hip  Right;Left    Right Hip Flexion 5/5    Left Hip Flexion 3+/5    Right/Left Knee Right;Left    Right Knee Flexion 5/5    Right Knee Extension 5/5    Left Knee Flexion 4/5    Left Knee Extension 4/5    Right/Left Ankle Right;Left    Right Ankle Dorsiflexion 5/5    Right Ankle Plantar Flexion 5/5    Left Ankle Dorsiflexion 4/5    Left Ankle Plantar Flexion 4/5      Transfers   Transfers Sit to Stand;Stand to Sit    Sit to Stand 6: Modified independent (Device/Increase time)    Five time sit to stand comments  10.59    Stand to Sit 6: Modified independent (Device/Increase time)      Ambulation/Gait   Ambulation/Gait Yes    Ambulation/Gait Assistance 6: Modified independent (Device/Increase time)    Ambulation Distance (Feet) 300 Feet    Assistive device None    Gait Pattern Step-through pattern;Decreased arm swing - left;Decreased step length - left   Veers to R and L at times; pt tends to hold LUE in elbow flexion by her side (?decreased LUE awareness)   Ambulation Surface Level;Indoor    Gait velocity 8.91 sec = 3.68 ft/sec      High Level Balance   High Level Balance Comments EO and EC solid and foam surfaces, x 30 sec each; with EC on foam, increased sway noted and bias towards RLE weightshifting.  SLS :  LLE 1 second, RLE >7 sec      Functional Gait  Assessment   Gait assessed  Yes    Gait Level Surface Walks 20 ft in less than 7 sec but greater than 5.5 sec, uses assistive device, slower speed, mild gait deviations, or deviates 6-10 in outside of the 12 in walkway width.   5.9   Change in Gait Speed Able to smoothly change walking speed without loss of balance or gait deviation. Deviate no more than 6 in outside of the 12 in walkway width.    Gait with Horizontal Head Turns Performs head turns smoothly with no change in gait. Deviates no  more than 6 in outside 12 in walkway width   5.03   Gait with Vertical Head Turns Performs task with slight change in gait velocity (eg, minor  disruption to smooth gait path), deviates 6 - 10 in outside 12 in walkway width or uses assistive device    Gait and Pivot Turn Pivot turns safely within 3 sec and stops quickly with no loss of balance.    Step Over Obstacle Is able to step over one shoe box (4.5 in total height) but must slow down and adjust steps to clear box safely. May require verbal cueing.    Gait with Narrow Base of Support Ambulates less than 4 steps heel to toe or cannot perform without assistance.    Gait with Eyes Closed Walks 20 ft, slow speed, abnormal gait pattern, evidence for imbalance, deviates 10-15 in outside 12 in walkway width. Requires more than 9 sec to ambulate 20 ft.   11.09   Ambulating Backwards Walks 20 ft, uses assistive device, slower speed, mild gait deviations, deviates 6-10 in outside 12 in walkway width.   15.1   Steps Alternating feet, no rail.    Total Score 20    FGA comment: Scores <22/30 indicates increased fall risk                  Vestibular Assessment - 05/02/20 0001      Symptom Behavior   Subjective history of current problem Initial 2-day episode of vertical diploplia, which has resolved.  Now, pt reports motion sickness type feeling with squatting/looking down, looking up/down with gait, which resolves quickly.    Type of Dizziness  Imbalance   motion sickness   Duration of Dizziness seconds    Symptom Nature Motion provoked    Aggravating Factors Looking up to the ceiling   bending down/looking down   Relieving Factors Head stationary   Getting self steady   Progression of Symptoms Better   Better since diplopia resolved, but has stayed the same since then     Oculomotor Exam   Oculomotor Alignment Normal    Ocular ROM WNL    Smooth Pursuits Intact    Comment Tested in sitting, no c/o motion sickness feeling      Vestibulo-Ocular Reflex   VOR 1 Head Only (x 1 viewing) x 5 reps horizontal and x 5 reps vertical; pt sitting    Comment no symptoms               Objective measurements completed on examination: See above findings.               PT Education - 05/02/20 0941    Education Details PT eval results, PT POC    Person(s) Educated Patient    Methods Explanation    Comprehension Verbalized understanding            PT Short Term Goals - 05/02/20 0954      PT SHORT TERM GOAL #1   Title Pt will be independent with HEP for improved strength, balance,and gait.  TARGET 06/02/2020    Time 4    Period Weeks    Status New      PT SHORT TERM GOAL #2   Title Pt will improve FGA score to at least 23/30 to decrease fall risk.    Baseline 20/30 at eval    Time 4    Period Weeks    Status New      PT SHORT TERM GOAL #3  Title Pt will improve SLS on LLE to at least 3 seconds for improved balance, obstacle, and stair negotiation.    Time 4    Period Weeks    Status New      PT SHORT TERM GOAL #4   Title Further balance/vestibular testing to be assessed, with goal to be written as appropriate (consider Sensory Organization Test)    Time 4    Period Weeks    Status New      PT SHORT TERM GOAL #5   Title Pt will verbalize understanding of fall prevention in home environment.    Time 4    Period Weeks    Status New             PT Long Term Goals - 05/02/20 0956      PT LONG TERM GOAL #1   Title Pt will be independent with progression of HEP for improved strength, balance, and gait.  TARGET 06/30/2020    Time 8    Period Weeks    Status New      PT LONG TERM GOAL #2   Title Pt will ambulate at least 1000 ft, indoor, and outdoor surfaces independently, no LOB for improved community gait.    Time 8    Period Weeks    Status New      PT LONG TERM GOAL #3   Title FOTO score to improve by at least 10% for improved functional mobility overall.    Time 8    Period Weeks    Status New      PT LONG TERM GOAL #4   Title Pt will report overall at least 50% improvement in nausea/motion sickness with dynamic  gait and balance activities.    Time 8    Period Weeks    Status New                  Plan - 05/02/20 0941    Clinical Impression Statement Pt is a 65 year old female with mild hyperlipidemia and no family history of stroke presenting with vertical diplopia, feeling of off-balance with lean to left; new onset Saturday 04/08/20.  MRI scan 04/12/20 showed small R superior ventral midbrain infarct.  She presents to OPPT with resolved diploplia, but continued balance deficits and several near falls.  She presents with decreased LLE strength (specifically at L hip and L ankle), decreased timing and coordination with gait, decreased balance, decreased vestibular system input (increased sway noted with EC on foam).  She was independent and active prior to CVA and she would benefit from skilled PT to address the above stated deficits for improved overall functional mobility and independence, decreased fall risk.    Personal Factors and Comorbidities Comorbidity 3+    Comorbidities mild hyperlipidemia, R torn meniscus (was to have R knee surgery in March 22, but cancelled), R RTC repair, L THA    Examination-Activity Limitations Locomotion Level;Stand;Squat    Examination-Participation Restrictions Community Activity;Yard Work;Other   Taking care of dog   Stability/Clinical Decision Making Evolving/Moderate complexity    Clinical Decision Making Moderate    Rehab Potential Good    PT Frequency 1x / week    PT Duration 8 weeks   plus eval   PT Treatment/Interventions ADLs/Self Care Home Management;Gait training;Stair training;Functional mobility training;Therapeutic activities;Therapeutic exercise;Balance training;Neuromuscular re-education;Patient/family education;Vestibular    PT Next Visit Plan Further vestibular testing as appropriate (may consider Sensory Organization test); initiate HEP for corner balance exercises (up/down head  motions tend to provoke motion sensitivity), L hip and L ankle  strengthening.  High level balance and gait activities    Consulted and Agree with Plan of Care Patient           Patient will benefit from skilled therapeutic intervention in order to improve the following deficits and impairments:  Abnormal gait,Difficulty walking,Decreased balance,Decreased mobility,Decreased strength  Visit Diagnosis: Other abnormalities of gait and mobility  Unsteadiness on feet  Muscle weakness (generalized)     Problem List Patient Active Problem List   Diagnosis Date Noted  . Cerebrovascular accident (CVA) ([place]) 04/17/2020  . History of hip replacement 08/18/2018  . OA (osteoarthritis) of hip 08/03/2018  . Hyperglycemia 05/18/2018  . Skin lump of leg, right 12/30/2017  . Genital herpes 12/30/2017    Jakson Delpilar W. 05/02/2020, 10:01 AM Frazier Butt., PT  [facility] [address] [place], [place], [address] Phone: [phone]   Fax:  [phone]  Name: HYDIE LANGAN MRN: [id number] Date of Birth: Dec 30, 1955

## 2020-05-02 NOTE — Progress Notes (Signed)
Kindly inform the patient that lower extremity venous Doppler study was negative for blood clots

## 2020-05-02 NOTE — Progress Notes (Signed)
Patient was informed about the results of the study and its implication and questions answered at the time the study was done

## 2020-05-04 NOTE — Telephone Encounter (Signed)
See note  Labs cancelled

## 2020-05-08 ENCOUNTER — Other Ambulatory Visit: Payer: Self-pay

## 2020-05-08 ENCOUNTER — Ambulatory Visit: Payer: 59 | Admitting: Occupational Therapy

## 2020-05-08 ENCOUNTER — Encounter: Payer: Self-pay | Admitting: Occupational Therapy

## 2020-05-08 ENCOUNTER — Ambulatory Visit: Payer: 59

## 2020-05-08 DIAGNOSIS — R41842 Visuospatial deficit: Secondary | ICD-10-CM

## 2020-05-08 DIAGNOSIS — M6281 Muscle weakness (generalized): Secondary | ICD-10-CM | POA: Diagnosis not present

## 2020-05-08 DIAGNOSIS — I639 Cerebral infarction, unspecified: Secondary | ICD-10-CM | POA: Diagnosis not present

## 2020-05-08 DIAGNOSIS — R278 Other lack of coordination: Secondary | ICD-10-CM | POA: Diagnosis not present

## 2020-05-08 DIAGNOSIS — I69854 Hemiplegia and hemiparesis following other cerebrovascular disease affecting left non-dominant side: Secondary | ICD-10-CM

## 2020-05-08 DIAGNOSIS — R2689 Other abnormalities of gait and mobility: Secondary | ICD-10-CM | POA: Diagnosis not present

## 2020-05-08 DIAGNOSIS — R2681 Unsteadiness on feet: Secondary | ICD-10-CM | POA: Diagnosis not present

## 2020-05-08 NOTE — Therapy (Signed)
[facility] [address] [place], [place], [address] Phone: [phone]   Fax:  [phone]  Occupational Therapy Treatment  Patient Details  Name: Sandra Clay MRN: [id number] Date of Birth: 1956-03-05 Referring Provider (OT): Dimas Chyle, MD   Encounter Date: 05/08/2020   OT End of Session - 05/08/20 1614    Visit Number 4    Number of Visits 7    Date for OT Re-Evaluation 06/02/20    Authorization Type [facility]    Authorization Time Period $20 copay VL:MN    OT Start Time 1614    OT Stop Time 1654    OT Time Calculation (min) 40 min    Activity Tolerance Patient tolerated treatment well    Behavior During Therapy [facility] for tasks assessed/performed           Past Medical History:  Diagnosis Date  . Anxiety   . Cancer ([place])    skin cancer   . Cataract    left forming   . Depression    situational , resolved   . Headache(784.0)   . Osteoarthritis   . PONV (postoperative nausea and vomiting)   . Stroke [facility]) 03/2020    Past Surgical History:  Procedure Laterality Date  . BUNIONECTOMY    . CESAREAN SECTION     x3  . CHOLECYSTECTOMY    . COLONOSCOPY     ~51 ish per pt  bucini  . FRACTURE SURGERY    . KNEE ARTHROSCOPY W/ MENISCAL REPAIR Right    x2  . ROTATOR CUFF REPAIR  2018   right   . TOTAL HIP ARTHROPLASTY Left 08/03/2018   Procedure: TOTAL HIP ARTHROPLASTY ANTERIOR APPROACH;  Surgeon: Gaynelle Arabian, MD;  Location: [facility];  Service: Orthopedics;  Laterality: Left;  . TUBAL LIGATION    . tubal reversal      There were no vitals filed for this visit.   Subjective Assessment - 05/08/20 1615    Subjective  "I found out I have a PFO"    Pertinent History PMH mild HLD, no hx of HTN but blood pressure readings high at time of stroke    Patient Stated Goals Typing and playing piano    Currently in Pain? No/denies                        OT Treatments/Exercises (OP) - 05/08/20  1616      Cognitive Exercises   Keyboarding Typing test on Typing Master with 33 wpm 58% accuracy and 19 net speed. Pt got mixed up on home row and accuracy was impacted. Pt trialed again with 38 wpm 86% accuracy and 33 net speed.      Visual/Perceptual Exercises   Scanning Tabletop    Scanning - Tabletop 100% accuracy      Fine Motor Coordination (Hand/Wrist)   Fine Motor Coordination In hand manipuation training;Purdue Pegboard;Small Pegboard    In Hand Manipulation Training with golf balls in LUE - pt with easier time with counterclockwise than clockwise with activity but able to complete both ways    Small Pegboard with LUE on vertical surface for shoulder stability and proximal strength and coordination in LUE. min drops and min difficulty. removed on tabletop with in hand manipulation    Purdue Pegboard with LUE with min diffiulty and min drops            assessed 9 hole peg test. See LTG for score.  OT Short Term Goals - 05/08/20 1630      OT SHORT TERM GOAL #1   Title Pt will be independent with HEP 05/12/20    Time 3    Period Weeks    Status Achieved    Target Date 05/12/20      OT SHORT TERM GOAL #2   Title Pt will report less drops from LUE during functional daily tasks.    Time 3    Period Weeks    Status On-going   pt reports about the same     OT SHORT TERM GOAL #3   Title Pt will complete tabletop scanning with 85% accuracy or greater for increase in attention to detail and left attention.    Baseline 65% accuracy    Time 3    Period Weeks    Status Achieved   100% on 05/08/2020     OT SHORT TERM GOAL #4   Title Pt will improve typing speed and accuracy to 35wpm with 50% or greater accuracy on typing test    Baseline 31 wpm 30% accuracy 9 wpm net speed    Time 3    Period Weeks    Status Achieved   38% and 86% accuracy     OT SHORT TERM GOAL #5   Title Pt will report increased ease and coordination with playing piano.    Time 3     Period Weeks    Status On-going   says practicing but it hasn't gotten much better            OT Long Term Goals - 05/08/20 1630      OT LONG TERM GOAL #1   Title Pt will be independent with updated HEP 06/02/20    Time 6    Period Weeks    Status New      OT LONG TERM GOAL #2   Title Pt will improve grip strength in LUE to 60 lbs or greater for increase in overall functional use for daily activities.    Baseline 52lbs    Time 6    Period Weeks    Status On-going   05/01/20 52.4 lbs     OT LONG TERM GOAL #3   Title Pt will perform environmental scanning with 100% accuracy or greater with added component (physical or cognitive)    Baseline 100% accuracy in min distracting environment    Time 6    Period Weeks    Status New      OT LONG TERM GOAL #4   Title Pt will complete FOTO and score 70% or higher    Baseline 59%    Time 6    Period Weeks    Status New      OT LONG TERM GOAL #5   Title Pt will increase 9 hole peg test score to 23 seconds or less with LUE for increase in fine motor coordination    Baseline 28.12s    Time 6    Period Weeks    Status On-going   25.46s on 05/08/2020     OT LONG TERM GOAL #6   Title Pt will demonstrate improved typing speed to 45 wpm with 75% or greater accuracy on typing test    Baseline 31 wpm 30% accuracy (9wpm net speed)    Time 6    Period Weeks    Status New  Patient will benefit from skilled therapeutic intervention in order to improve the following deficits and impairments:           Visit Diagnosis: Other lack of coordination  Visuospatial deficit  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 Cerebrovascular accident (CVA) ([place]) 04/17/2020  . History of hip replacement 08/18/2018  . OA (osteoarthritis) of hip 08/03/2018   . Hyperglycemia 05/18/2018  . Skin lump of leg, right 12/30/2017  . Genital herpes 12/30/2017    Zachery Conch MOT, OTR/L  05/08/2020, 4:54 PM  [facility] [address] [place], [place], [address] Phone: [phone]   Fax:  [phone]  Name: JAHNAYA BRANSCOME MRN: [id number] Date of Birth: 20-Oct-1955

## 2020-05-08 NOTE — Patient Instructions (Addendum)
Access Code: [email or web address] URL: [email or web address] Date: 05/08/2020 Prepared by: Sharlynn Oliphant  Exercises Heel Toe Raises with Counter Support - 1 x daily - 5 x weekly - 3 sets - 10 reps Heel Toe Raises with Unilateral Counter Support - 1 x daily - 5 x weekly - 3 sets - 10 reps Bilateral Long Arc Quad - 1 x daily - 5 x weekly - 3 sets - 10 reps Standing Hip Abduction - 1 x daily - 5 x weekly - 3 sets - 10 reps

## 2020-05-08 NOTE — Therapy (Signed)
[facility] [address] [place], [place], [address] Phone: [phone]   Fax:  [phone]  Physical Therapy Treatment  Patient Details  Name: Sandra Clay MRN: [id number] Date of Birth: [birth year] Referring Provider (PT): Leonie Man   Encounter Date: 05/08/2020   PT End of Session - 05/08/20 1722    Visit Number 2    Number of Visits 9    Authorization Type [facility]    PT Start Time 7017    PT Stop Time 1615    PT Time Calculation (min) 45 min    Equipment Utilized During Treatment Gait belt    Activity Tolerance Patient tolerated treatment well    Behavior During Therapy [facility] for tasks assessed/performed           Past Medical History:  Diagnosis Date  . Anxiety   . Cancer ([place])    skin cancer   . Cataract    left forming   . Depression    situational , resolved   . Headache(784.0)   . Osteoarthritis   . PONV (postoperative nausea and vomiting)   . Stroke [facility]) 03/2020    Past Surgical History:  Procedure Laterality Date  . BUNIONECTOMY    . CESAREAN SECTION     x3  . CHOLECYSTECTOMY    . COLONOSCOPY     ~51 ish per pt  bucini  . FRACTURE SURGERY    . KNEE ARTHROSCOPY W/ MENISCAL REPAIR Right    x2  . ROTATOR CUFF REPAIR  2018   right   . TOTAL HIP ARTHROPLASTY Left 08/03/2018   Procedure: TOTAL HIP ARTHROPLASTY ANTERIOR APPROACH;  Surgeon: Gaynelle Arabian, MD;  Location: [facility];  Service: Orthopedics;  Laterality: Left;  . TUBAL LIGATION    . tubal reversal      There were no vitals filed for this visit.   Subjective Assessment - 05/08/20 1537    Subjective dizzines improved overall, main concern is catching L foot when walking as well as aggravating current R knee menscal pathology    Diagnostic tests MRI; recent imaging shows PFO, per pt report    Patient Stated Goals Pt's goals for PT are to improve knowledge for being safer with balance; don't want to fall.    Currently in Pain?  No/denies                             [facility] Adult PT Treatment/Exercise - 05/08/20 0001      Knee/Hip Exercises: Aerobic   Nustep 8" at L2      Knee/Hip Exercises: Seated   Long Arc Quad Weight 0 lbs.    Long Arc Quad Limitations 0# 1x10    Abduction/Adduction  Strengthening;Both;1 set;10 reps    Abd/Adduction Limitations standing at counter      Ankle Exercises: Standing   Heel Raises Both;10 reps    Heel Raises Limitations performed at counter    Toe Raise 10 reps    Toe Raise Limitations performed at counter 1x10               Balance Exercises - 05/08/20 0001      Balance Exercises: Standing   Standing Eyes Opened Narrow base of support (BOS);Foam/compliant surface;1 rep    Standing Eyes Opened Time 30s    Standing Eyes Opened Limitations performed with head turns 10x    Standing Eyes Closed Narrow base of support (BOS);Foam/compliant surface;1 rep  Standing Eyes Closed Time 30s    Standing Eyes Closed Limitations performed with head turns x10    Tandem Stance Eyes open;Eyes closed;Foam/compliant surface;2 reps    Tandem Stance Time 30s ea. position    Tandem Stance Limitations performe dwith head turns 10x    Rockerboard Anterior/posterior;EO;EC;30 seconds    Tandem Gait Forward;Intermittent upper extremity support;Foam/compliant surface    Tandem Gait Limitations 5 trips in // bars across blue mat    Retro Gait Foam/compliant surface;Upper extremity support;5 reps    Retro Gait Limitations peformed in // bars 5 trips across blue mat             PT Education - 05/08/20 1721    Education Details see medbridge program    Person(s) Educated Patient    Methods Explanation;Demonstration;Handout    Comprehension Verbalized understanding;Returned demonstration            PT Short Term Goals - 05/02/20 0954      PT SHORT TERM GOAL #1   Title Pt will be independent with HEP for improved strength, balance,and gait.  TARGET 06/02/2020     Time 4    Period Weeks    Status New      PT SHORT TERM GOAL #2   Title Pt will improve FGA score to at least 23/30 to decrease fall risk.    Baseline 20/30 at eval    Time 4    Period Weeks    Status New      PT SHORT TERM GOAL #3   Title Pt will improve SLS on LLE to at least 3 seconds for improved balance, obstacle, and stair negotiation.    Time 4    Period Weeks    Status New      PT SHORT TERM GOAL #4   Title Further balance/vestibular testing to be assessed, with goal to be written as appropriate (consider Sensory Organization Test)    Time 4    Period Weeks    Status New      PT SHORT TERM GOAL #5   Title Pt will verbalize understanding of fall prevention in home environment.    Time 4    Period Weeks    Status New             PT Long Term Goals - 05/02/20 0956      PT LONG TERM GOAL #1   Title Pt will be independent with progression of HEP for improved strength, balance, and gait.  TARGET 06/30/2020    Time 8    Period Weeks    Status New      PT LONG TERM GOAL #2   Title Pt will ambulate at least 1000 ft, indoor, and outdoor surfaces independently, no LOB for improved community gait.    Time 8    Period Weeks    Status New      PT LONG TERM GOAL #3   Title FOTO score to improve by at least 10% for improved functional mobility overall.    Time 8    Period Weeks    Status New      PT LONG TERM GOAL #4   Title Pt will report overall at least 50% improvement in nausea/motion sickness with dynamic gait and balance activities.    Time 8    Period Weeks    Status New                 Plan - 05/08/20 1722  Clinical Impression Statement little to no c/o dizziness or balance issues, R knee pain limiting factor, instructed in LE strength and balance training EC/EO, hesitant regarding STS and SLS activities for fear of aggravating R knee symptoms, HEP issued but PT reluctant to add balance training due to R knee pathology    Personal Factors and  Comorbidities Comorbidity 3+    Comorbidities mild hyperlipidemia, R torn meniscus (was to have R knee surgery in March 22, but cancelled), R RTC repair, L THA    Examination-Activity Limitations Locomotion Level;Stand;Squat    Examination-Participation Restrictions Community Activity;Yard Work;Other   Taking care of dog   Stability/Clinical Decision Making Evolving/Moderate complexity    Rehab Potential Good    PT Frequency 1x / week    PT Duration 8 weeks   plus eval   PT Treatment/Interventions ADLs/Self Care Home Management;Gait training;Stair training;Functional mobility training;Therapeutic activities;Therapeutic exercise;Balance training;Neuromuscular re-education;Patient/family education;Vestibular    PT Next Visit Plan Assess ability to perform standing balance tasks at home with HEP, continue to strengthen LEs and train balance system, account for any R knee symptoms to avoid aggravation of torm meniscus    PT Home Exercise Plan see medbridge program    Consulted and Agree with Plan of Care Patient           Patient will benefit from skilled therapeutic intervention in order to improve the following deficits and impairments:  Abnormal gait,Difficulty walking,Decreased balance,Decreased mobility,Decreased strength  Visit Diagnosis: Cerebrovascular accident (CVA), unspecified mechanism ([place])     Problem List Patient Active Problem List   Diagnosis Date Noted  . Cerebrovascular accident (CVA) ([place]) 04/17/2020  . History of hip replacement 08/18/2018  . OA (osteoarthritis) of hip 08/03/2018  . Hyperglycemia 05/18/2018  . Skin lump of leg, right 12/30/2017  . Genital herpes 12/30/2017    Lanice Shirts 05/08/2020, 5:42 PM  [facility] [address] [place], [place], [address] Phone: [phone]   Fax:  [phone]  Name: Sandra Clay MRN: [id number] Date of Birth: [birth year]

## 2020-05-10 ENCOUNTER — Other Ambulatory Visit: Payer: Self-pay

## 2020-05-10 ENCOUNTER — Encounter: Payer: Self-pay | Admitting: Cardiovascular Disease

## 2020-05-10 ENCOUNTER — Ambulatory Visit: Payer: 59 | Admitting: Cardiovascular Disease

## 2020-05-10 ENCOUNTER — Telehealth: Payer: Self-pay | Admitting: *Deleted

## 2020-05-10 ENCOUNTER — Encounter: Payer: Self-pay | Admitting: *Deleted

## 2020-05-10 VITALS — BP 150/90 | HR 88 | Ht 66.5 in | Wt 167.0 lb

## 2020-05-10 DIAGNOSIS — Z7689 Persons encountering health services in other specified circumstances: Secondary | ICD-10-CM | POA: Diagnosis not present

## 2020-05-10 DIAGNOSIS — Q211 Atrial septal defect: Secondary | ICD-10-CM

## 2020-05-10 DIAGNOSIS — I4891 Unspecified atrial fibrillation: Secondary | ICD-10-CM

## 2020-05-10 DIAGNOSIS — Q2112 Patent foramen ovale: Secondary | ICD-10-CM

## 2020-05-10 DIAGNOSIS — Z8673 Personal history of transient ischemic attack (TIA), and cerebral infarction without residual deficits: Secondary | ICD-10-CM | POA: Diagnosis not present

## 2020-05-10 NOTE — H&P (View-Only) (Signed)
Cardiology Office Note:    Date:  05/10/2020   ID:  Sandra Clay, Sandra Clay November 05, 1955, MRN [id number]  PCP:  Vivi Barrack, MD   [place]  Cardiologist:  No primary care provider on file.  Advanced Practice Provider:  No care team member to display Electrophysiologist:  None       Referring MD: Vivi Barrack, MD   Chief Complaint  Patient presents with  . PFO    History of Present Illness:    Sandra Clay is a 65 y.o. female presents for PFO evaluation.  The patient has been previously healthy.  She developed sudden onset of double vision in January of this year.  This was associated with poor balance and a feeling that she was leaning to the left.  There was associated nausea but no vomiting.  An outpatient evaluation was undertaken.  A CAT scan of the head showed no significant abnormality.  An MRI scan demonstrated a small right superior ventral midbrain infarct.  An MRA of the brain subsequently showed no significant large vessel atherosclerotic disease.  A 2D echocardiogram showed no abnormalities.  The lower extremity venous duplex was negative for DVT.  The transcranial Doppler ultrasound study showed Spencer grade 4 right to left shunt consistent with PFO.  The patient is here alone today.  She reports no history of cardiovascular risk factors.  She is a lifelong non-smoker.  She has no history of hypercholesterolemia or hypertension.  There is no family history of stroke or TIA.  The patient has a remote history of migraine headache but has had no problems over the last 5 years.  The patient has no chest pain, chest pressure, shortness of breath, heart palpitations, leg swelling, orthopnea, PND, lightheadedness, or syncope.    Past Medical History:  Diagnosis Date  . Anxiety   . Cancer ([place])    skin cancer   . Cataract    left forming   . Depression    situational , resolved   . Headache(784.0)   . Osteoarthritis   . PONV (postoperative nausea  and vomiting)   . Stroke [facility]) 03/2020    Past Surgical History:  Procedure Laterality Date  . BUNIONECTOMY    . CESAREAN SECTION     x3  . CHOLECYSTECTOMY    . COLONOSCOPY     ~51 ish per pt  bucini  . FRACTURE SURGERY    . KNEE ARTHROSCOPY W/ MENISCAL REPAIR Right    x2  . ROTATOR CUFF REPAIR  2018   right   . TOTAL HIP ARTHROPLASTY Left 08/03/2018   Procedure: TOTAL HIP ARTHROPLASTY ANTERIOR APPROACH;  Surgeon: Gaynelle Arabian, MD;  Location: [facility] ORS;  Service: Orthopedics;  Laterality: Left;  . TUBAL LIGATION    . tubal reversal      Current Medications: Current Meds  Medication Sig  . aspirin EC 81 MG tablet Take 81 mg by mouth daily. Swallow whole.  . clopidogrel (PLAVIX) 75 MG tablet Take 1 tablet (75 mg total) by mouth daily.  . Doxylamine Succinate, Sleep, (UNISOM PO) Take by mouth.  . rosuvastatin (CRESTOR) 20 MG tablet Take 1 tablet (20 mg total) by mouth daily.  . valACYclovir (VALTREX) 1000 MG tablet Take 1 g by mouth daily.  Marland Kitchen VITAMIN D PO Take by mouth.     Allergies:   Dilaudid [hydromorphone] and Sulfa antibiotics   Social History   Socioeconomic History  . Marital status: Single    Spouse  name: Not on file  . Number of children: Not on file  . Years of education: Not on file  . Highest education level: Not on file  Occupational History  . Occupation: Full time  Tobacco Use  . Smoking status: Former Research scientist (life sciences)  . Smokeless tobacco: Never Used  Substance and Sexual Activity  . Alcohol use: Yes    Alcohol/week: 1.0 - 2.0 standard drink    Types: 1 - 2 Glasses of wine per week    Comment: one to two glasses of wine per day   . Drug use: No  . Sexual activity: Yes    Birth control/protection: Post-menopausal  Other Topics Concern  . Not on file  Social History Narrative   Lives with significant other, Eulas Post.   Right Handed   Drinks 2-3 cups caffeine daily   Social Determinants of Health   Financial Resource Strain: Not on file  Food  Insecurity: Not on file  Transportation Needs: Not on file  Physical Activity: Not on file  Stress: Not on file  Social Connections: Not on file     Family History: The patient's family history includes Alcohol abuse in her brother, father, mother, and sister; Arthritis in her father; Breast cancer in her maternal aunt and mother; Cancer in her brother and father; Colon cancer (age of onset: 72) in her sister; Depression in her brother and sister; Diabetes in her father; Drug abuse in her brother; Early death in her brother; Hearing loss in her brother and father. There is no history of Colon polyps, Esophageal cancer, Rectal cancer, or Stomach cancer.  ROS:   Please see the history of present illness.    All other systems reviewed and are negative.  EKGs/Labs/Other Studies Reviewed:    The following studies were reviewed today: Echo 04/26/2020: IMPRESSIONS    1. Left ventricular ejection fraction, by estimation, is 60 to 65%. The  left ventricle has normal function. The left ventricle has no regional  wall motion abnormalities. Left ventricular diastolic parameters are  consistent with Grade I diastolic  dysfunction (impaired relaxation).  2. Right ventricular systolic function is normal. The right ventricular  size is normal.  3. The mitral valve is normal in structure. No evidence of mitral valve  regurgitation. No evidence of mitral stenosis.  4. The aortic valve is tricuspid. Aortic valve regurgitation is not  visualized. No aortic stenosis is present.  5. The inferior vena cava is normal in size with greater than 50%  respiratory variability, suggesting right atrial pressure of 3 mmHg.  Transcranial Doppler 05/01/2020: Summary:     A vascular evaluation was performed. The right middle cerebral artery was  studied. An IV was inserted into the patient's left forearm. Verbal  informed consent was obtained.    A small to medium sized PFO is noted based on a partial  curtain effect in  the presence of the valsalva maneuver.  Angela Burke 4   MRI Brain IMPRESSION: Small late acute to subacute infarct of the right superior ventral midbrain.  Minor chronic microvascular ischemic changes.  MRA Brain: IMPRESSION: 1. Negative MRA of the head and neck. No large vessel occlusion or hemodynamically significant stenosis. 2. Mild distal small vessel atheromatous irregularity within the intracranial circulation. 3. Predominant fetal type origin of the PCAs with overall diminutive vertebrobasilar system.  EKG:  EKG is not ordered today.  The ekg from  04/10/2020 demonstrates NSR, within normal limits  Recent Labs: 09/09/2019: ALT 16; Hemoglobin 14.4; Platelets 247.0; TSH  1.32 09/22/2019: BUN 14; Creatinine, Ser 0.77; Potassium 4.3; Sodium 139  Recent Lipid Panel    Component Value Date/Time   CHOL 145 04/24/2020 1236   TRIG 220 (H) 04/24/2020 1236   HDL 68 04/24/2020 1236   CHOLHDL 2.1 04/24/2020 1236   CHOLHDL 3 09/09/2019 0834   VLDL 12.0 09/09/2019 0834   LDLCALC 43 04/24/2020 1236     Risk Assessment/Calculations:       Physical Exam:    VS:  BP (!) 150/90   Pulse 88   Ht 5' 6.5" (1.689 m)   Wt 167 lb (75.8 kg)   SpO2 98%   BMI 26.55 kg/m     Wt Readings from Last 3 Encounters:  05/10/20 167 lb (75.8 kg)  04/24/20 173 lb 3.2 oz (78.6 kg)  04/17/20 168 lb 6.4 oz (76.4 kg)     GEN:  Well nourished, well developed in no acute distress HEENT: Normal NECK: No JVD; No carotid bruits LYMPHATICS: No lymphadenopathy CARDIAC: RRR, no murmurs, rubs, gallops RESPIRATORY:  Clear to auscultation without rales, wheezing or rhonchi  ABDOMEN: Soft, non-tender, non-distended MUSCULOSKELETAL:  No edema; No deformity  SKIN: Warm and dry NEUROLOGIC:  Alert and oriented x 3 PSYCHIATRIC:  Normal affect   ASSESSMENT:    1. History of stroke   2. Atrial fibrillation, unspecified type ([place])   3. PFO (patent foramen ovale)    PLAN:    In  order of problems listed above:  1. We discussed possible etiologies of stroke.  I have reviewed all of her imaging studies and clinical notes.  I think it would be reasonable to consider a transesophageal echo to assess the anatomy of this patient's PFO.  It is interesting that her stroke occurred about 1 week after undergoing sclerotherapy of the left leg.  I advised her that I will reach out to Dr. Leonie Man as I would like his guidance on whether he feels PFO closure would be reasonable if she has an anatomically high risk defect.  If she has a small PFO I would be inclined to pursue medical management.  Would also be reasonable to perform 30-day event monitoring to look for subclinical atrial fibrillation.  I reviewed risks, indications, and alternatives of transesophageal echo with the patient.  She understands and agrees to proceed. 2. We will check 30-day event monitor to evaluate for the presence of atrial fibrillation.  This has not been documented in the past.  Her EKG is reviewed and it demonstrates normal sinus rhythm. 3. As above, we discussed the data regarding transcatheter PFO closure in secondary stroke prevention in the setting of initial cryptogenic stroke.  The Amplatzer PFO occluder device is demonstrated to the patient today.  We discussed procedural steps, potential risks, potential benefits, and alternatives.  Again, I plan to further assess with event monitoring, transesophageal echo, and further discussion with Dr. Leonie Man.   Shared Decision Making/Informed Consent The risks [esophageal damage, perforation (1:10,000 risk), bleeding, pharyngeal hematoma as well as other potential complications associated with conscious sedation including aspiration, arrhythmia, respiratory failure and death], benefits (treatment guidance and diagnostic support) and alternatives of a transesophageal echocardiogram were discussed in detail with Ms. Somes and she is willing to proceed.       Medication  Adjustments/Labs and Tests Ordered: Current medicines are reviewed at length with the patient today.  Concerns regarding medicines are outlined above.  Orders Placed This Encounter  Procedures  . Basic metabolic panel  . CBC with Differential/Platelet  .  CARDIAC EVENT MONITOR  . EKG 12-Lead   No orders of the defined types were placed in this encounter.   Patient Instructions  COVID SCREENING INFORMATION: You are scheduled for your drive-thru COVID screening on: Pre-Procedural COVID-19 Testing Site [address] [place], [place] [address] You will need to go home after your screening and quarantine until your procedure.   TEE INSTRUCTIONS: You are scheduled for a TEE on:  Please arrive at the [facility] (Main Entrance A) at [facility]: [address] [place], [place] [address] at: (1 hour prior to procedure unless lab work is needed; if lab work is needed arrive 1.5 hours ahead). You are allowed ONE visitor in the waiting room during your procedure. Both you and your guest must wear masks.  1. DIET: Nothing to eat or drink after midnight except a sip of water with medications.  2. MEDICATION INSTRUCTIONS: 1) You may take your medications as directed with a sip of water  3. LABS:  TODAY!  4. You must have a responsible person to drive you home and stay in the waiting area during your procedure. Failure to do so could result in cancellation.  5. Bring your insurance cards.  *Special Note: Every effort is made to have your procedure done on time. Occasionally there are emergencies that occur at the hospital that may cause delays. Please be patient if a delay does occur.     Your physician has recommended that you wear an event monitor. Event monitors are medical devices that record the heart's electrical activity. Doctors most often [place] these monitors to diagnose arrhythmias. Arrhythmias are problems with the speed or rhythm of the heartbeat. The monitor is a small,  portable device. You can wear one while you do your normal daily activities. This is usually used to diagnose what is causing palpitations/syncope (passing out).  [employer] Cardiac Event Monitor Instructions Your physician has requested you wear your cardiac event monitor for 30 days. [employer] may call or text to confirm a shipping address. The monitor will be sent to a land address via UPS. [employer] will not ship a monitor to a PO BOX. It typically takes 3-5 days to receive your monitor after it has been enrolled. [employer] will assist with USPS tracking if your package is delayed. The telephone number for [employer] is [phone]. Once you have received your monitor, please review the enclosed instructions. Instruction tutorials can also be viewed under help and settings on the enclosed cell phone. Your monitor has already been registered assigning a specific monitor serial # to you.  Applying the monitor Remove cell phone from case and turn it on. The cell phone works as Dealer and needs to be within [employer] of you at all times. The cell phone will need to be charged on a daily basis. We recommend you plug the cell phone into the enclosed charger at your bedside table every night.  Monitor batteries: You will receive two monitor batteries labelled #1 and #2. These are your recorders. Plug battery #2 onto the second connection on the enclosed charger. Keep one battery on the charger at all times. This will keep the monitor battery deactivated. It will also keep it fully charged for when you need to switch your monitor batteries. A small light will be blinking on the battery emblem when it is charging. The light on the battery emblem will remain on when the battery is fully charged.  Open package of a Monitor strip. Insert battery #  1 into black hood on strip and gently squeeze monitor battery onto connection as indicated in instruction booklet. Set aside while preparing  skin.  Choose location for your strip, vertical or horizontal, as indicated in the instruction booklet. Shave to remove all hair from location. There cannot be any lotions, oils, powders, or colognes on skin where monitor is to be applied. Wipe skin clean with enclosed Saline wipe. Dry skin completely.  Peel paper labeled #1 off the back of the Monitor strip exposing the adhesive. Place the monitor on the chest in the vertical or horizontal position shown in the instruction booklet. One arrow on the monitor strip must be pointing upward. Carefully remove paper labeled #2, attaching remainder of strip to your skin. Try not to create any folds or wrinkles in the strip as you apply it.  Firmly press and release the circle in the center of the monitor battery. You will hear a small beep. This is turning the monitor battery on. The heart emblem on the monitor battery will light up every 5 seconds if the monitor battery in turned on and connected to the patient securely. Do not push and hold the circle down as this turns the monitor battery off. The cell phone will locate the monitor battery. A screen will appear on the cell phone checking the connection of your monitor strip. This may read poor connection initially but change to good connection within the next minute. Once your monitor accepts the connection you will hear a series of 3 beeps followed by a climbing crescendo of beeps. A screen will appear on the cell phone showing the two monitor strip placement options. Touch the picture that demonstrates where you applied the monitor strip.  Your monitor strip and battery are waterproof. You are able to shower, bathe, or swim with the monitor on. They just ask you do not submerge deeper than 3 feet underwater. We recommend removing the monitor if you are swimming in a lake, river, or ocean.  Your monitor battery will need to be switched to a fully charged monitor battery approximately once a  week. The cell phone will alert you of an action which needs to be made.  On the cell phone, tap for details to reveal connection status, monitor battery status, and cell phone battery status. The green dots indicates your monitor is in good status. A red dot indicates there is something that needs your attention.  To record a symptom, click the circle on the monitor battery. In 30-60 seconds a list of symptoms will appear on the cell phone. Select your symptom and tap save. Your monitor will record a sustained or significant arrhythmia regardless of you clicking the button. Some patients do not feel the heart rhythm irregularities. [employer] will notify us of any serious or critical events.  Refer to instruction booklet for instructions on switching batteries, changing strips, the Do not disturb or Pause features, or any additional questions.  Call [employer] at [phone], to confirm your monitor is transmitting and record your baseline. They will answer any questions you may have regarding the monitor instructions at that time.  Returning the monitor to [place] all equipment back into blue box. Peel off strip of paper to expose adhesive and close box securely. There is a prepaid UPS shipping label on this box. Drop in a UPS drop box, or at a UPS facility like [employer]. You may also contact [employer] to arrange UPS to pick up monitor package at your home.  Signed, Sherren Mocha, MD  05/10/2020 3:51 PM    [facility]

## 2020-05-10 NOTE — Telephone Encounter (Signed)
I spoke to the patient and she verbalized understanding of the results.

## 2020-05-10 NOTE — Progress Notes (Signed)
Patient ID: Sandra Clay, female   DOB: [birth year], 65 y.o.   MRN: [id number] Patient enrolled for [employer] to ship a 30 day cardiac event monitor to her home.

## 2020-05-10 NOTE — Progress Notes (Signed)
Cardiology Office Note:    Date:  05/10/2020   ID:  Sandra Clay 1956-02-17, MRN [id number]  PCP:  Vivi Barrack, MD   [place]  Cardiologist:  No primary care provider on file.  Advanced Practice Provider:  No care team member to display Electrophysiologist:  None       Referring MD: Vivi Barrack, MD   Chief Complaint  Patient presents with   PFO    History of Present Illness:    Sandra Clay is a 65 y.o. female presents for PFO evaluation.  The patient has been previously healthy.  She developed sudden onset of double vision in January of this year.  This was associated with poor balance and a feeling that she was leaning to the left.  There was associated nausea but no vomiting.  An outpatient evaluation was undertaken.  A CAT scan of the head showed no significant abnormality.  An MRI scan demonstrated a small right superior ventral midbrain infarct.  An MRA of the brain subsequently showed no significant large vessel atherosclerotic disease.  A 2D echocardiogram showed no abnormalities.  The lower extremity venous duplex was negative for DVT.  The transcranial Doppler ultrasound study showed Spencer grade 4 right to left shunt consistent with PFO.  The patient is here alone today.  She reports no history of cardiovascular risk factors.  She is a lifelong non-smoker.  She has no history of hypercholesterolemia or hypertension.  There is no family history of stroke or TIA.  The patient has a remote history of migraine headache but has had no problems over the last 5 years.  The patient has no chest pain, chest pressure, shortness of breath, heart palpitations, leg swelling, orthopnea, PND, lightheadedness, or syncope.    Past Medical History:  Diagnosis Date   Anxiety    Cancer ([place])    skin cancer    Cataract    left forming    Depression    situational , resolved    Headache(784.0)    Osteoarthritis    PONV (postoperative nausea  and vomiting)    Stroke ([place]) 03/2020    Past Surgical History:  Procedure Laterality Date   BUNIONECTOMY     CESAREAN SECTION     x3   CHOLECYSTECTOMY     COLONOSCOPY     ~51 ish per pt  bucini   FRACTURE SURGERY     KNEE ARTHROSCOPY W/ MENISCAL REPAIR Right    x2   ROTATOR CUFF REPAIR  2018   right    TOTAL HIP ARTHROPLASTY Left 08/03/2018   Procedure: TOTAL HIP ARTHROPLASTY ANTERIOR APPROACH;  Surgeon: Gaynelle Arabian, MD;  Location: [facility];  Service: Orthopedics;  Laterality: Left;   TUBAL LIGATION     tubal reversal      Current Medications: Current Meds  Medication Sig   aspirin EC 81 MG tablet Take 81 mg by mouth daily. Swallow whole.   clopidogrel (PLAVIX) 75 MG tablet Take 1 tablet (75 mg total) by mouth daily.   Doxylamine Succinate, Sleep, (UNISOM PO) Take by mouth.   rosuvastatin (CRESTOR) 20 MG tablet Take 1 tablet (20 mg total) by mouth daily.   valACYclovir (VALTREX) 1000 MG tablet Take 1 g by mouth daily.   VITAMIN D PO Take by mouth.     Allergies:   Dilaudid [hydromorphone] and Sulfa antibiotics   Social History   Socioeconomic History   Marital status: Single    Spouse  name: Not on file   Number of children: Not on file   Years of education: Not on file   Highest education level: Not on file  Occupational History   Occupation: Full time  Tobacco Use   Smoking status: Former Smoker   Smokeless tobacco: Never Used  Substance and Sexual Activity   Alcohol use: Yes    Alcohol/week: 1.0 - 2.0 standard drink    Types: 1 - 2 Glasses of wine per week    Comment: one to two glasses of wine per day    Drug use: No   Sexual activity: Yes    Birth control/protection: Post-menopausal  Other Topics Concern   Not on file  Social History Narrative   Lives with significant other, Carter.   Right Handed   Drinks 2-3 cups caffeine daily   Social Determinants of Health   Financial Resource Strain: Not on file  Food  Insecurity: Not on file  Transportation Needs: Not on file  Physical Activity: Not on file  Stress: Not on file  Social Connections: Not on file     Family History: The patient's family history includes Alcohol abuse in her brother, father, mother, and sister; Arthritis in her father; Breast cancer in her maternal aunt and mother; Cancer in her brother and father; Colon cancer (age of onset: 28) in her sister; Depression in her brother and sister; Diabetes in her father; Drug abuse in her brother; Early death in her brother; Hearing loss in her brother and father. There is no history of Colon polyps, Esophageal cancer, Rectal cancer, or Stomach cancer.  ROS:   Please see the history of present illness.    All other systems reviewed and are negative.  EKGs/Labs/Other Studies Reviewed:    The following studies were reviewed today: Echo 04/26/2020: IMPRESSIONS    1. Left ventricular ejection fraction, by estimation, is 60 to 65%. The  left ventricle has normal function. The left ventricle has no regional  wall motion abnormalities. Left ventricular diastolic parameters are  consistent with Grade I diastolic  dysfunction (impaired relaxation).  2. Right ventricular systolic function is normal. The right ventricular  size is normal.  3. The mitral valve is normal in structure. No evidence of mitral valve  regurgitation. No evidence of mitral stenosis.  4. The aortic valve is tricuspid. Aortic valve regurgitation is not  visualized. No aortic stenosis is present.  5. The inferior vena cava is normal in size with greater than 50%  respiratory variability, suggesting right atrial pressure of 3 mmHg.  Transcranial Doppler 05/01/2020: Summary:     A vascular evaluation was performed. The right middle cerebral artery was  studied. An IV was inserted into the patient's left forearm. Verbal  informed consent was obtained.    A small to medium sized PFO is noted based on a partial  curtain effect in  the presence of the valsalva maneuver.  Sandra Clay 4   MRI Brain IMPRESSION: Small late acute to subacute infarct of the right superior ventral midbrain.  Minor chronic microvascular ischemic changes.  MRA Brain: IMPRESSION: 1. Negative MRA of the head and neck. No large vessel occlusion or hemodynamically significant stenosis. 2. Mild distal small vessel atheromatous irregularity within the intracranial circulation. 3. Predominant fetal type origin of the PCAs with overall diminutive vertebrobasilar system.  EKG:  EKG is not ordered today.  The ekg from  04/10/2020 demonstrates NSR, within normal limits  Recent Labs: 09/09/2019: ALT 16; Hemoglobin 14.4; Platelets 247.0; TSH  1.32 09/22/2019: BUN 14; Creatinine, Ser 0.77; Potassium 4.3; Sodium 139  Recent Lipid Panel    Component Value Date/Time   CHOL 145 04/24/2020 1236   TRIG 220 (H) 04/24/2020 1236   HDL 68 04/24/2020 1236   CHOLHDL 2.1 04/24/2020 1236   CHOLHDL 3 09/09/2019 0834   VLDL 12.0 09/09/2019 0834   LDLCALC 43 04/24/2020 1236     Risk Assessment/Calculations:       Physical Exam:    VS:  BP (!) 150/90    Pulse 88    Ht 5' 6.5" (1.689 m)    Wt 167 lb (75.8 kg)    SpO2 98%    BMI 26.55 kg/m     Wt Readings from Last 3 Encounters:  05/10/20 167 lb (75.8 kg)  04/24/20 173 lb 3.2 oz (78.6 kg)  04/17/20 168 lb 6.4 oz (76.4 kg)     GEN:  Well nourished, well developed in no acute distress HEENT: Normal NECK: No JVD; No carotid bruits LYMPHATICS: No lymphadenopathy CARDIAC: RRR, no murmurs, rubs, gallops RESPIRATORY:  Clear to auscultation without rales, wheezing or rhonchi  ABDOMEN: Soft, non-tender, non-distended MUSCULOSKELETAL:  No edema; No deformity  SKIN: Warm and dry NEUROLOGIC:  Alert and oriented x 3 PSYCHIATRIC:  Normal affect   ASSESSMENT:    1. History of stroke   2. Atrial fibrillation, unspecified type ([place])   3. PFO (patent foramen ovale)    PLAN:    In  order of problems listed above:  1. We discussed possible etiologies of stroke.  I have reviewed all of her imaging studies and clinical notes.  I think it would be reasonable to consider a transesophageal echo to assess the anatomy of this patient's PFO.  It is interesting that her stroke occurred about 1 week after undergoing sclerotherapy of the left leg.  I advised her that I will reach out to Dr. Leonie Man as I would like his guidance on whether he feels PFO closure would be reasonable if she has an anatomically high risk defect.  If she has a small PFO I would be inclined to pursue medical management.  Would also be reasonable to perform 30-day event monitoring to look for subclinical atrial fibrillation.  I reviewed risks, indications, and alternatives of transesophageal echo with the patient.  She understands and agrees to proceed. 2. We will check 30-day event monitor to evaluate for the presence of atrial fibrillation.  This has not been documented in the past.  Her EKG is reviewed and it demonstrates normal sinus rhythm. 3. As above, we discussed the data regarding transcatheter PFO closure in secondary stroke prevention in the setting of initial cryptogenic stroke.  The Amplatzer PFO occluder device is demonstrated to the patient today.  We discussed procedural steps, potential risks, potential benefits, and alternatives.  Again, I plan to further assess with event monitoring, transesophageal echo, and further discussion with Dr. Leonie Man.   Shared Decision Making/Informed Consent The risks [esophageal damage, perforation (1:10,000 risk), bleeding, pharyngeal hematoma as well as other potential complications associated with conscious sedation including aspiration, arrhythmia, respiratory failure and death], benefits (treatment guidance and diagnostic support) and alternatives of a transesophageal echocardiogram were discussed in detail with Ms. Salone and she is willing to proceed.       Medication  Adjustments/Labs and Tests Ordered: Current medicines are reviewed at length with the patient today.  Concerns regarding medicines are outlined above.  Orders Placed This Encounter  Procedures   Basic metabolic panel  CBC with Differential/Platelet   CARDIAC EVENT MONITOR   EKG 12-Lead   No orders of the defined types were placed in this encounter.   Patient Instructions  COVID SCREENING INFORMATION: You are scheduled for your drive-thru COVID screening on: Pre-Procedural COVID-19 Testing Site [address] [place], [place] [address] You will need to go home after your screening and quarantine until your procedure.   TEE INSTRUCTIONS: You are scheduled for a TEE on:  Please arrive at the [facility] (Main Entrance A) at [facility]: [address] [place], [place] [address] at: (1 hour prior to procedure unless lab work is needed; if lab work is needed arrive 1.5 hours ahead). You are allowed ONE visitor in the waiting room during your procedure. Both you and your guest must wear masks.  1. DIET: Nothing to eat or drink after midnight except a sip of water with medications.  2. MEDICATION INSTRUCTIONS: 1) You may take your medications as directed with a sip of water  3. LABS:  TODAY!  4. You must have a responsible person to drive you home and stay in the waiting area during your procedure. Failure to do so could result in cancellation.  5. Bring your insurance cards.  *Special Note: Every effort is made to have your procedure done on time. Occasionally there are emergencies that occur at the hospital that may cause delays. Please be patient if a delay does occur.     Your physician has recommended that you wear an event monitor. Event monitors are medical devices that record the hearts electrical activity. Doctors most often [place] these monitors to diagnose arrhythmias. Arrhythmias are problems with the speed or rhythm of the heartbeat. The monitor is a small,  portable device. You can wear one while you do your normal daily activities. This is usually used to diagnose what is causing palpitations/syncope (passing out).  [employer] Cardiac Event Monitor Instructions Your physician has requested you wear your cardiac event monitor for 30 days. [employer] may call or text to confirm a shipping address. The monitor will be sent to a land address via UPS. [employer] will not ship a monitor to a PO BOX. It typically takes 3-5 days to receive your monitor after it has been enrolled. [employer] will assist with USPS tracking if your package is delayed. The telephone number for [employer] is [phone]. Once you have received your monitor, please review the enclosed instructions. Instruction tutorials can also be viewed under help and settings on the enclosed cell phone. Your monitor has already been registered assigning a specific monitor serial # to you.  Applying the monitor Remove cell phone from case and turn it on. The cell phone works as Dealer and needs to be within [employer] of you at all times. The cell phone will need to be charged on a daily basis. We recommend you plug the cell phone into the enclosed charger at your bedside table every night.  Monitor batteries: You will receive two monitor batteries labelled #1 and #2. These are your recorders. Plug battery #2 onto the second connection on the enclosed charger. Keep one battery on the charger at all times. This will keep the monitor battery deactivated. It will also keep it fully charged for when you need to switch your monitor batteries. A small light will be blinking on the battery emblem when it is charging. The light on the battery emblem will remain on when the battery is fully charged.  Open package of  a Monitor strip. Insert battery #1 into black hood on strip and gently squeeze monitor battery onto connection as indicated in instruction booklet. Set aside while preparing  skin.  Choose location for your strip, vertical or horizontal, as indicated in the instruction booklet. Shave to remove all hair from location. There cannot be any lotions, oils, powders, or colognes on skin where monitor is to be applied. Wipe skin clean with enclosed Saline wipe. Dry skin completely.  Peel paper labeled #1 off the back of the Monitor strip exposing the adhesive. Place the monitor on the chest in the vertical or horizontal position shown in the instruction booklet. One arrow on the monitor strip must be pointing upward. Carefully remove paper labeled #2, attaching remainder of strip to your skin. Try not to create any folds or wrinkles in the strip as you apply it.  Firmly press and release the circle in the center of the monitor battery. You will hear a small beep. This is turning the monitor battery on. The heart emblem on the monitor battery will light up every 5 seconds if the monitor battery in turned on and connected to the patient securely. Do not push and hold the circle down as this turns the monitor battery off. The cell phone will locate the monitor battery. A screen will appear on the cell phone checking the connection of your monitor strip. This may read poor connection initially but change to good connection within the next minute. Once your monitor accepts the connection you will hear a series of 3 beeps followed by a climbing crescendo of beeps. A screen will appear on the cell phone showing the two monitor strip placement options. Touch the picture that demonstrates where you applied the monitor strip.  Your monitor strip and battery are waterproof. You are able to shower, bathe, or swim with the monitor on. They just ask you do not submerge deeper than 3 feet underwater. We recommend removing the monitor if you are swimming in a lake, river, or ocean.  Your monitor battery will need to be switched to a fully charged monitor battery approximately once a  week. The cell phone will alert you of an action which needs to be made.  On the cell phone, tap for details to reveal connection status, monitor battery status, and cell phone battery status. The green dots indicates your monitor is in good status. A red dot indicates there is something that needs your attention.  To record a symptom, click the circle on the monitor battery. In 30-60 seconds a list of symptoms will appear on the cell phone. Select your symptom and tap save. Your monitor will record a sustained or significant arrhythmia regardless of you clicking the button. Some patients do not feel the heart rhythm irregularities. [employer] will notify us of any serious or critical events.  Refer to instruction booklet for instructions on switching batteries, changing strips, the Do not disturb or Pause features, or any additional questions.  Call [employer] at [phone], to confirm your monitor is transmitting and record your baseline. They will answer any questions you may have regarding the monitor instructions at that time.  Returning the monitor to [place] all equipment back into blue box. Peel off strip of paper to expose adhesive and close box securely. There is a prepaid UPS shipping label on this box. Drop in a UPS drop box, or at a UPS facility like [employer]. You may also contact [employer] to arrange UPS to pick up monitor  package at your home.     Signed, Sherren Mocha, MD  05/10/2020 3:51 PM    [facility]

## 2020-05-10 NOTE — Telephone Encounter (Signed)
-----   Message from Garvin Fila, MD sent at 05/02/2020  4:08 PM EST ----- Kindly inform the patient that lower extremity venous Doppler study was negative for blood clots

## 2020-05-10 NOTE — Progress Notes (Signed)
TEE scheduled 3/4 with Dr. Sallyanne Kuster Dx: PFO Arrival time 0800 for 0900 case Case ID: [id number]

## 2020-05-10 NOTE — Patient Instructions (Addendum)
COVID SCREENING INFORMATION (05/17/20): You are scheduled for your drive-thru COVID screening on: 05/17/2020 between 1:30PM and 2:30PM. Pre-Procedural COVID-19 Testing Site [address] [place], [place] [address] You will need to go home after your screening and quarantine until your procedure.   TEE INSTRUCTIONS (05/19/20): You are scheduled for a TEE on: Friday, May 19, 2020 with Dr. Sallyanne Kuster.   Please arrive at the [facility] (Main Entrance A) at [facility]: [address] [place], [place] [address] at [place] are allowed ONE visitor in the waiting room during your procedure. Both you and your guest must wear masks.  1. DIET: Nothing to eat or drink after midnight except a sip of water with medications.  2. MEDICATION INSTRUCTIONS: 1) You may take your medications as directed with a sip of water  3. LABS:  TODAY!  4. You must have a responsible person to drive you home and stay in the waiting area during your procedure. Failure to do so could result in cancellation.  5. Bring your insurance cards.  *Special Note: Every effort is made to have your procedure done on time. Occasionally there are emergencies that occur at the hospital that may cause delays. Please be patient if a delay does occur.     Your physician has recommended that you wear an event monitor. Event monitors are medical devices that record the heart's electrical activity. Doctors most often [place] these monitors to diagnose arrhythmias. Arrhythmias are problems with the speed or rhythm of the heartbeat. The monitor is a small, portable device. You can wear one while you do your normal daily activities. This is usually used to diagnose what is causing palpitations/syncope (passing out).  [employer] Cardiac Event Monitor Instructions Your physician has requested you wear your cardiac event monitor for 30 days. [employer] may call or text to confirm a shipping address. The monitor will be sent to a land address  via UPS. [employer] will not ship a monitor to a PO BOX. It typically takes 3-5 days to receive your monitor after it has been enrolled. [employer] will assist with USPS tracking if your package is delayed. The telephone number for [employer] is [phone]. Once you have received your monitor, please review the enclosed instructions. Instruction tutorials can also be viewed under help and settings on the enclosed cell phone. Your monitor has already been registered assigning a specific monitor serial # to you.  Applying the monitor Remove cell phone from case and turn it on. The cell phone works as Dealer and needs to be within [employer] of you at all times. The cell phone will need to be charged on a daily basis. We recommend you plug the cell phone into the enclosed charger at your bedside table every night.  Monitor batteries: You will receive two monitor batteries labelled #1 and #2. These are your recorders. Plug battery #2 onto the second connection on the enclosed charger. Keep one battery on the charger at all times. This will keep the monitor battery deactivated. It will also keep it fully charged for when you need to switch your monitor batteries. A small light will be blinking on the battery emblem when it is charging. The light on the battery emblem will remain on when the battery is fully charged.  Open package of a Monitor strip. Insert battery #1 into black hood on strip and gently squeeze monitor battery onto connection as indicated in instruction booklet. Set aside while preparing skin.  Choose location for your  strip, vertical or horizontal, as indicated in the instruction booklet. Shave to remove all hair from location. There cannot be any lotions, oils, powders, or colognes on skin where monitor is to be applied. Wipe skin clean with enclosed Saline wipe. Dry skin completely.  Peel paper labeled #1 off the back of the Monitor strip exposing the adhesive.  Place the monitor on the chest in the vertical or horizontal position shown in the instruction booklet. One arrow on the monitor strip must be pointing upward. Carefully remove paper labeled #2, attaching remainder of strip to your skin. Try not to create any folds or wrinkles in the strip as you apply it.  Firmly press and release the circle in the center of the monitor battery. You will hear a small beep. This is turning the monitor battery on. The heart emblem on the monitor battery will light up every 5 seconds if the monitor battery in turned on and connected to the patient securely. Do not push and hold the circle down as this turns the monitor battery off. The cell phone will locate the monitor battery. A screen will appear on the cell phone checking the connection of your monitor strip. This may read poor connection initially but change to good connection within the next minute. Once your monitor accepts the connection you will hear a series of 3 beeps followed by a climbing crescendo of beeps. A screen will appear on the cell phone showing the two monitor strip placement options. Touch the picture that demonstrates where you applied the monitor strip.  Your monitor strip and battery are waterproof. You are able to shower, bathe, or swim with the monitor on. They just ask you do not submerge deeper than 3 feet underwater. We recommend removing the monitor if you are swimming in a lake, river, or ocean.  Your monitor battery will need to be switched to a fully charged monitor battery approximately once a week. The cell phone will alert you of an action which needs to be made.  On the cell phone, tap for details to reveal connection status, monitor battery status, and cell phone battery status. The green dots indicates your monitor is in good status. A red dot indicates there is something that needs your attention.  To record a symptom, click the circle on the monitor battery. In 30-60  seconds a list of symptoms will appear on the cell phone. Select your symptom and tap save. Your monitor will record a sustained or significant arrhythmia regardless of you clicking the button. Some patients do not feel the heart rhythm irregularities. [employer] will notify us of any serious or critical events.  Refer to instruction booklet for instructions on switching batteries, changing strips, the Do not disturb or Pause features, or any additional questions.  Call [employer] at [phone], to confirm your monitor is transmitting and record your baseline. They will answer any questions you may have regarding the monitor instructions at that time.  Returning the monitor to [facility] all equipment back into blue box. Peel off strip of paper to expose adhesive and close box securely. There is a prepaid UPS shipping label on this box. Drop in a UPS drop box, or at a UPS facility like [employer]. You may also contact [employer] to arrange UPS to pick up monitor package at your home.

## 2020-05-11 LAB — CBC WITH DIFFERENTIAL/PLATELET
Basophils Absolute: 0.1 10*3/uL (ref 0.0–0.2)
Basos: 2 %
EOS (ABSOLUTE): 0.1 10*3/uL (ref 0.0–0.4)
Eos: 1 %
Hematocrit: 40.1 % (ref 34.0–46.6)
Hemoglobin: 14 g/dL (ref 11.1–15.9)
Immature Grans (Abs): 0 10*3/uL (ref 0.0–0.1)
Immature Granulocytes: 0 %
Lymphocytes Absolute: 1.6 10*3/uL (ref 0.7–3.1)
Lymphs: 26 %
MCH: 33.3 pg — ABNORMAL HIGH (ref 26.6–33.0)
MCHC: 34.9 g/dL (ref 31.5–35.7)
MCV: 96 fL (ref 79–97)
Monocytes Absolute: 0.6 10*3/uL (ref 0.1–0.9)
Monocytes: 10 %
Neutrophils Absolute: 3.8 10*3/uL (ref 1.4–7.0)
Neutrophils: 61 %
Platelets: 257 10*3/uL (ref 150–450)
RBC: 4.2 x10E6/uL (ref 3.77–5.28)
RDW: 12.4 % (ref 11.7–15.4)
WBC: 6.2 10*3/uL (ref 3.4–10.8)

## 2020-05-11 LAB — BASIC METABOLIC PANEL
BUN/Creatinine Ratio: 20 (ref 12–28)
BUN: 17 mg/dL (ref 8–27)
CO2: 22 mmol/L (ref 20–29)
Calcium: 9.5 mg/dL (ref 8.7–10.3)
Chloride: 104 mmol/L (ref 96–106)
Creatinine, Ser: 0.84 mg/dL (ref 0.57–1.00)
GFR calc Af Amer: 83 mL/min/{1.73_m2} (ref >59)
GFR calc non Af Amer: 72 mL/min/{1.73_m2} (ref >59)
Glucose: 95 mg/dL (ref 65–99)
Potassium: 4.3 mmol/L (ref 3.5–5.2)
Sodium: 144 mmol/L (ref 134–144)

## 2020-05-15 ENCOUNTER — Encounter: Payer: Self-pay | Admitting: Occupational Therapy

## 2020-05-15 ENCOUNTER — Ambulatory Visit: Payer: 59 | Admitting: Occupational Therapy

## 2020-05-15 ENCOUNTER — Ambulatory Visit: Payer: 59

## 2020-05-15 ENCOUNTER — Other Ambulatory Visit: Payer: Self-pay

## 2020-05-15 DIAGNOSIS — R41842 Visuospatial deficit: Secondary | ICD-10-CM

## 2020-05-15 DIAGNOSIS — I69854 Hemiplegia and hemiparesis following other cerebrovascular disease affecting left non-dominant side: Secondary | ICD-10-CM

## 2020-05-15 DIAGNOSIS — R2681 Unsteadiness on feet: Secondary | ICD-10-CM

## 2020-05-15 DIAGNOSIS — M6281 Muscle weakness (generalized): Secondary | ICD-10-CM | POA: Diagnosis not present

## 2020-05-15 DIAGNOSIS — R278 Other lack of coordination: Secondary | ICD-10-CM

## 2020-05-15 DIAGNOSIS — R2689 Other abnormalities of gait and mobility: Secondary | ICD-10-CM | POA: Diagnosis not present

## 2020-05-15 DIAGNOSIS — I639 Cerebral infarction, unspecified: Secondary | ICD-10-CM | POA: Diagnosis not present

## 2020-05-15 NOTE — Therapy (Signed)
[facility] [address] [place], [place], [address] Phone: [phone]   Fax:  [phone]  Physical Therapy Treatment  Patient Details  Name: Sandra Clay MRN: [id number] Date of Birth: Oct 05, 1955 Referring Provider (PT): Leonie Man   Encounter Date: 05/15/2020   PT End of Session - 05/15/20 1327    Visit Number 3    Number of Visits 9    Authorization Type  UMR    PT Start Time 1100    PT Stop Time 1145    PT Time Calculation (min) 45 min    Equipment Utilized During Treatment Gait belt    Activity Tolerance Patient tolerated treatment well    Behavior During Therapy [facility] for tasks assessed/performed           Past Medical History:  Diagnosis Date  . Anxiety   . Cancer ([place])    skin cancer   . Cataract    left forming   . Depression    situational , resolved   . Headache(784.0)   . Osteoarthritis   . PONV (postoperative nausea and vomiting)   . Stroke [facility]) 03/2020    Past Surgical History:  Procedure Laterality Date  . BUNIONECTOMY    . CESAREAN SECTION     x3  . CHOLECYSTECTOMY    . COLONOSCOPY     ~51 ish per pt  bucini  . FRACTURE SURGERY    . KNEE ARTHROSCOPY W/ MENISCAL REPAIR Right    x2  . ROTATOR CUFF REPAIR  2018   right   . TOTAL HIP ARTHROPLASTY Left 08/03/2018   Procedure: TOTAL HIP ARTHROPLASTY ANTERIOR APPROACH;  Surgeon: Gaynelle Arabian, MD;  Location: [facility];  Service: Orthopedics;  Laterality: Left;  . TUBAL LIGATION    . tubal reversal      There were no vitals filed for this visit.   Subjective Assessment - 05/15/20 1104    Subjective No c/o dizziness recently, does not feel restricted in her ADLs or work duties, close to fully functional    How long can you sit comfortably? no problems    How long can you stand comfortably? no problems    How long can you walk comfortably? no problems    Diagnostic tests MRI; recent imaging shows PFO, per pt report    Patient  Stated Goals Pt's goals for PT are to improve knowledge for being safer with balance; don't want to fall.    Currently in Pain? No/denies                             [facility] Adult PT Treatment/Exercise - 05/15/20 1108      Ambulation/Gait   Ambulation/Gait Yes    Ambulation/Gait Assistance 5: Supervision    Ambulation/Gait Assistance Details performed self catch with ball    Ambulation Distance (Feet) 430 Feet    Gait Comments 259ft CW, 250ft CCW      Knee/Hip Exercises: Aerobic   Nustep Scifit L2 8'               Balance Exercises - 05/15/20 0001      Balance Exercises: Standing   Standing Eyes Opened Foam/compliant surface;Wide (BOA);2 reps;30 secs    Standing Eyes Opened Time 30s    Standing Eyes Opened Limitations perfomed with head turns and nods, 10x ea.    Standing Eyes Closed Narrow base of support (BOS);Head turns;Foam/compliant surface;2 reps;30 secs;Limitations  Standing Eyes Closed Time 30s    Standing Eyes Closed Limitations performe dwith head turns and nods, 10x ea.    Tandem Stance Eyes open;Eyes closed;Foam/compliant surface;Intermittent upper extremity support;2 reps;30 secs    Tandem Stance Time 30s ea. position    Tandem Stance Limitations performed with head turns and nods 10x, EO/EC    Retro Gait 4 reps;Limitations;Other (comment)    Retro Gait Limitations 4x26ft EC with CGA    Step Over Hurdles / Cones 20ftx4 stepping over hurdles leading with LLE CGA    Other Standing Exercises ambulation 57ftx4 EC with CGA             PT Education - 05/15/20 1327    Education Details see above    Person(s) Educated Patient    Methods Explanation    Comprehension Verbalized understanding            PT Short Term Goals - 05/02/20 0954      PT SHORT TERM GOAL #1   Title Pt will be independent with HEP for improved strength, balance,and gait.  TARGET 06/02/2020    Time 4    Period Weeks    Status New      PT SHORT TERM GOAL #2    Title Pt will improve FGA score to at least 23/30 to decrease fall risk.    Baseline 20/30 at eval    Time 4    Period Weeks    Status New      PT SHORT TERM GOAL #3   Title Pt will improve SLS on LLE to at least 3 seconds for improved balance, obstacle, and stair negotiation.    Time 4    Period Weeks    Status New      PT SHORT TERM GOAL #4   Title Further balance/vestibular testing to be assessed, with goal to be written as appropriate (consider Sensory Organization Test)    Time 4    Period Weeks    Status New      PT SHORT TERM GOAL #5   Title Pt will verbalize understanding of fall prevention in home environment.    Time 4    Period Weeks    Status New             PT Long Term Goals - 05/02/20 0956      PT LONG TERM GOAL #1   Title Pt will be independent with progression of HEP for improved strength, balance, and gait.  TARGET 06/30/2020    Time 8    Period Weeks    Status New      PT LONG TERM GOAL #2   Title Pt will ambulate at least 1000 ft, indoor, and outdoor surfaces independently, no LOB for improved community gait.    Time 8    Period Weeks    Status New      PT LONG TERM GOAL #3   Title FOTO score to improve by at least 10% for improved functional mobility overall.    Time 8    Period Weeks    Status New      PT LONG TERM GOAL #4   Title Pt will report overall at least 50% improvement in nausea/motion sickness with dynamic gait and balance activities.    Time 8    Period Weeks    Status New                 Plan - 05/15/20 1328  Clinical Impression Statement patient reporting minimal deficits in ADLs and work tasks, feels steady on her feet, focus of skilled session was high level gait and dual task ambulation, EC proved challenging but no distinct LOB noted    Personal Factors and Comorbidities Comorbidity 3+    Comorbidities mild hyperlipidemia, R torn meniscus (was to have R knee surgery in March 22, but cancelled), R RTC repair, L  THA    Examination-Activity Limitations Locomotion Level;Stand;Squat    Examination-Participation Restrictions Community Activity;Yard Work;Other   Taking care of dog   Stability/Clinical Decision Making Evolving/Moderate complexity    Rehab Potential Good    PT Frequency 1x / week    PT Duration 8 weeks   plus eval   PT Treatment/Interventions ADLs/Self Care Home Management;Gait training;Stair training;Functional mobility training;Therapeutic activities;Therapeutic exercise;Balance training;Neuromuscular re-education;Patient/family education;Vestibular    PT Next Visit Plan assess percieved deficits oustside of clinic and address accordingly, continue high level gait and balance training, vision removed, assess STGs    Consulted and Agree with Plan of Care Patient           Patient will benefit from skilled therapeutic intervention in order to improve the following deficits and impairments:  Abnormal gait,Difficulty walking,Decreased balance,Decreased mobility,Decreased strength  Visit Diagnosis: Unsteadiness on feet  Muscle weakness (generalized)     Problem List Patient Active Problem List   Diagnosis Date Noted  . Cerebrovascular accident (CVA) ([place]) 04/17/2020  . History of hip replacement 08/18/2018  . OA (osteoarthritis) of hip 08/03/2018  . Hyperglycemia 05/18/2018  . Skin lump of leg, right 12/30/2017  . Genital herpes 12/30/2017    Lanice Shirts PT 05/15/2020, 1:34 PM  [facility] [address] [place], [place], [address] Phone: [phone]   Fax:  [phone]  Name: Sandra Clay MRN: [id number] Date of Birth: 07-Mar-1956

## 2020-05-15 NOTE — Therapy (Signed)
[facility] [address] [place], [place], [address] Phone: [phone]   Fax:  [phone]  Occupational Therapy Treatment  Patient Details  Name: Sandra Clay MRN: [id number] Date of Birth: 29-May-1955 Referring Provider (OT): Dimas Chyle, MD   Encounter Date: 05/15/2020   OT End of Session - 05/15/20 1017    Visit Number 5    Number of Visits 7    Date for OT Re-Evaluation 06/02/20    Authorization Type [facility]    Authorization Time Period $20 copay VL:MN    OT Start Time 1017    OT Stop Time 1100    OT Time Calculation (min) 43 min    Activity Tolerance Patient tolerated treatment well    Behavior During Therapy [facility] for tasks assessed/performed           Past Medical History:  Diagnosis Date  . Anxiety   . Cancer ([place])    skin cancer   . Cataract    left forming   . Depression    situational , resolved   . Headache(784.0)   . Osteoarthritis   . PONV (postoperative nausea and vomiting)   . Stroke [facility]) 03/2020    Past Surgical History:  Procedure Laterality Date  . BUNIONECTOMY    . CESAREAN SECTION     x3  . CHOLECYSTECTOMY    . COLONOSCOPY     ~51 ish per pt  bucini  . FRACTURE SURGERY    . KNEE ARTHROSCOPY W/ MENISCAL REPAIR Right    x2  . ROTATOR CUFF REPAIR  2018   right   . TOTAL HIP ARTHROPLASTY Left 08/03/2018   Procedure: TOTAL HIP ARTHROPLASTY ANTERIOR APPROACH;  Surgeon: Gaynelle Arabian, MD;  Location: [facility];  Service: Orthopedics;  Laterality: Left;  . TUBAL LIGATION    . tubal reversal      There were no vitals filed for this visit.   Subjective Assessment - 05/15/20 1017    Subjective  "these mondays come around fast, don't they?"    Pertinent History PMH mild HLD, no hx of HTN but blood pressure readings high at time of stroke    Patient Stated Goals Typing and playing piano    Currently in Pain? No/denies                        OT Treatments/Exercises  (OP) - 05/15/20 1019      Cognitive Exercises   Keyboarding Typing games with clouds and word tris with good attention with min inattention to left UE and errors      Hand Exercises   Other Hand Exercises Hand Gripper with silver spring on level 3 with LUE and picking up 1 inch blocks. min drops and min difficulty      Visual/Perceptual Exercises   Scanning Environmental    Scanning - Environmental tossing ball and ambulating while completing environmental scanning with 93% accuracy (14/15)      Fine Motor Coordination (Hand/Wrist)   Fine Motor Coordination In hand manipuation training;Stacking coins;O'Connor pegs    In Hand Manipulation Training with golf balls in LUE - pt with easier time with counterclockwise than clockwise with activity but able to complete both ways    Stacking coins stacking pennies in stacks of 5    O'Connor pegs O'Connor with tweezers with LUE with min difficulty and min drops  OT Short Term Goals - 05/08/20 1630      OT SHORT TERM GOAL #1   Title Pt will be independent with HEP 05/12/20    Time 3    Period Weeks    Status Achieved    Target Date 05/12/20      OT SHORT TERM GOAL #2   Title Pt will report less drops from LUE during functional daily tasks.    Time 3    Period Weeks    Status On-going   pt reports about the same     OT SHORT TERM GOAL #3   Title Pt will complete tabletop scanning with 85% accuracy or greater for increase in attention to detail and left attention.    Baseline 65% accuracy    Time 3    Period Weeks    Status Achieved   100% on 05/08/2020     OT SHORT TERM GOAL #4   Title Pt will improve typing speed and accuracy to 35wpm with 50% or greater accuracy on typing test    Baseline 31 wpm 30% accuracy 9 wpm net speed    Time 3    Period Weeks    Status Achieved   38% and 86% accuracy     OT SHORT TERM GOAL #5   Title Pt will report increased ease and coordination with playing piano.    Time 3     Period Weeks    Status On-going   says practicing but it hasn't gotten much better            OT Long Term Goals - 05/15/20 1031      OT LONG TERM GOAL #1   Title Pt will be independent with updated HEP 06/02/20    Time 6    Period Weeks    Status New      OT LONG TERM GOAL #2   Title Pt will improve grip strength in LUE to 60 lbs or greater for increase in overall functional use for daily activities.    Baseline 52lbs    Time 6    Period Weeks    Status On-going   05/01/20 52.4 lbs     OT LONG TERM GOAL #3   Title Pt will perform environmental scanning with 100% accuracy or greater with added component (physical or cognitive)    Baseline 100% accuracy in min distracting environment    Time 6    Period Weeks    Status On-going   93% with tossing ball     OT LONG TERM GOAL #4   Title Pt will complete FOTO and score 70% or higher    Baseline 59%    Time 6    Period Weeks    Status New      OT LONG TERM GOAL #5   Title Pt will increase 9 hole peg test score to 23 seconds or less with LUE for increase in fine motor coordination    Baseline 28.12s    Time 6    Period Weeks    Status On-going   25.46s on 05/08/2020     OT LONG TERM GOAL #6   Title Pt will demonstrate improved typing speed to 45 wpm with 75% or greater accuracy on typing test    Baseline 31 wpm 30% accuracy (9wpm net speed)    Time 6    Period Weeks    Status New  Plan - 05/15/20 1041    Clinical Impression Statement Pt making progress with LUE cooridnation and strength. Pt making progress with attention and environmental scanning    OT Occupational Profile and History Problem Focused Assessment - Including review of records relating to presenting problem    Occupational performance deficits (Please refer to evaluation for details): Leisure;Work;IADL's    Body Structure / Function / Physical Skills UE functional use;Decreased knowledge of use of  DME;Dexterity;Vision;FMC;Coordination;Strength;GMC    Rehab Potential Good    Clinical Decision Making Limited treatment options, no task modification necessary    Comorbidities Affecting Occupational Performance: None    Modification or Assistance to Complete Evaluation  No modification of tasks or assist necessary to complete eval    OT Frequency 1x / week    OT Duration 6 weeks    OT Treatment/Interventions Therapeutic exercise;Neuromuscular education;Therapeutic activities;DME and/or AE instruction;Patient/family education    Plan LUE proximal strengthening HEP, typing    OT Home Exercise Plan Coordination  HEP    Consulted and Agree with Plan of Care Patient           Patient will benefit from skilled therapeutic intervention in order to improve the following deficits and impairments:   Body Structure / Function / Physical Skills: UE functional use,Decreased knowledge of use of [place]       Visit Diagnosis: Other lack of coordination  Unsteadiness on feet  Visuospatial deficit  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 Cerebrovascular accident (CVA) ([place]) 04/17/2020  . History of hip replacement 08/18/2018  . OA (osteoarthritis) of hip 08/03/2018  . Hyperglycemia 05/18/2018  . Skin lump of leg, right 12/30/2017  . Genital herpes 12/30/2017    Zachery Conch MOT, OTR/L  05/15/2020, 12:45 PM  [facility] [address] [place], [place], [address] Phone: [phone]   Fax:  [phone]  Name: Sandra Clay MRN: [id number] Date of Birth: May 01, 1955

## 2020-05-15 NOTE — Patient Instructions (Signed)
Patient instructed to identify tasks outside of clinic that prove difficult to her

## 2020-05-16 ENCOUNTER — Other Ambulatory Visit: Payer: Self-pay | Admitting: Obstetrics & Gynecology

## 2020-05-16 DIAGNOSIS — Z1231 Encounter for screening mammogram for malignant neoplasm of breast: Secondary | ICD-10-CM

## 2020-05-17 ENCOUNTER — Other Ambulatory Visit (HOSPITAL_COMMUNITY)
Admission: RE | Admit: 2020-05-17 | Discharge: 2020-05-17 | Disposition: A | Discharge status: Home / Self Care | Payer: 59 | Source: Ambulatory Visit | Attending: Cardiovascular Disease | Admitting: Cardiovascular Disease

## 2020-05-17 DIAGNOSIS — Z20822 Contact with and (suspected) exposure to covid-19: Secondary | ICD-10-CM | POA: Diagnosis not present

## 2020-05-17 DIAGNOSIS — Z01812 Encounter for preprocedural laboratory examination: Secondary | ICD-10-CM | POA: Diagnosis not present

## 2020-05-17 LAB — SARS CORONAVIRUS 2 (TAT 6-24 HRS): SARS Coronavirus 2: NEGATIVE

## 2020-05-17 NOTE — Progress Notes (Signed)
Prior to getting her covid test, the pt stated that she tested + via a home test on 03/18/20.

## 2020-05-18 ENCOUNTER — Other Ambulatory Visit: Payer: Self-pay | Admitting: Cardiovascular Disease

## 2020-05-18 DIAGNOSIS — Q211 Atrial septal defect: Secondary | ICD-10-CM

## 2020-05-18 DIAGNOSIS — Q2112 Patent foramen ovale: Secondary | ICD-10-CM

## 2020-05-19 ENCOUNTER — Encounter (HOSPITAL_COMMUNITY): Payer: Self-pay | Admitting: Cardiovascular Disease

## 2020-05-19 ENCOUNTER — Ambulatory Visit (HOSPITAL_BASED_OUTPATIENT_CLINIC_OR_DEPARTMENT_OTHER): Payer: 59

## 2020-05-19 ENCOUNTER — Encounter (HOSPITAL_COMMUNITY)
Admission: RE | Disposition: A | Discharge status: Home / Self Care | Payer: Self-pay | Source: Home / Self Care | Attending: Cardiovascular Disease

## 2020-05-19 ENCOUNTER — Ambulatory Visit (HOSPITAL_COMMUNITY)
Admission: RE | Admit: 2020-05-19 | Discharge: 2020-05-19 | Disposition: A | Discharge status: Home / Self Care | Payer: 59 | Attending: Cardiovascular Disease | Admitting: Cardiovascular Disease

## 2020-05-19 ENCOUNTER — Ambulatory Visit (HOSPITAL_COMMUNITY): Payer: 59 | Admitting: Certified Registered Nurse Anesthetist

## 2020-05-19 DIAGNOSIS — F418 Other specified anxiety disorders: Secondary | ICD-10-CM | POA: Diagnosis not present

## 2020-05-19 DIAGNOSIS — Z885 Allergy status to narcotic agent status: Secondary | ICD-10-CM | POA: Insufficient documentation

## 2020-05-19 DIAGNOSIS — I63431 Cerebral infarction due to embolism of right posterior cerebral artery: Secondary | ICD-10-CM

## 2020-05-19 DIAGNOSIS — I639 Cerebral infarction, unspecified: Secondary | ICD-10-CM | POA: Diagnosis not present

## 2020-05-19 DIAGNOSIS — Z8673 Personal history of transient ischemic attack (TIA), and cerebral infarction without residual deficits: Secondary | ICD-10-CM | POA: Insufficient documentation

## 2020-05-19 DIAGNOSIS — Z79899 Other long term (current) drug therapy: Secondary | ICD-10-CM | POA: Insufficient documentation

## 2020-05-19 DIAGNOSIS — Z7982 Long term (current) use of aspirin: Secondary | ICD-10-CM | POA: Diagnosis not present

## 2020-05-19 DIAGNOSIS — I253 Aneurysm of heart: Secondary | ICD-10-CM | POA: Diagnosis not present

## 2020-05-19 DIAGNOSIS — Q211 Atrial septal defect: Secondary | ICD-10-CM | POA: Diagnosis not present

## 2020-05-19 DIAGNOSIS — I4891 Unspecified atrial fibrillation: Secondary | ICD-10-CM | POA: Diagnosis not present

## 2020-05-19 DIAGNOSIS — Q2112 Patent foramen ovale: Secondary | ICD-10-CM

## 2020-05-19 DIAGNOSIS — Z7902 Long term (current) use of antithrombotics/antiplatelets: Secondary | ICD-10-CM | POA: Diagnosis not present

## 2020-05-19 DIAGNOSIS — Z882 Allergy status to sulfonamides status: Secondary | ICD-10-CM | POA: Insufficient documentation

## 2020-05-19 HISTORY — PX: BUBBLE STUDY: SHX6837

## 2020-05-19 HISTORY — PX: TEE WITHOUT CARDIOVERSION: SHX5443

## 2020-05-19 SURGERY — ECHOCARDIOGRAM, TRANSESOPHAGEAL
Anesthesia: Monitor Anesthesia Care

## 2020-05-19 MED ORDER — LIDOCAINE HCL (PF) 2 % IJ SOLN
INTRAMUSCULAR | Status: DC | PRN
  Administered 2020-05-19: 100 mg via INTRADERMAL

## 2020-05-19 MED ORDER — SODIUM CHLORIDE 0.9 % IV SOLN: INTRAVENOUS | Status: DC

## 2020-05-19 MED ORDER — BUTAMBEN-TETRACAINE-BENZOCAINE 2-2-14 % EX AERO
INHALATION_SPRAY | CUTANEOUS | Status: DC | PRN
  Administered 2020-05-19: 2 via TOPICAL

## 2020-05-19 MED ORDER — PROPOFOL 10 MG/ML IV BOLUS
INTRAVENOUS | Status: DC | PRN
  Administered 2020-05-19 (×2): 30 mg via INTRAVENOUS
  Administered 2020-05-19: 20 mg via INTRAVENOUS

## 2020-05-19 MED ORDER — PROPOFOL 500 MG/50ML IV EMUL
INTRAVENOUS | Status: DC | PRN
  Administered 2020-05-19: 150 ug/kg/min via INTRAVENOUS

## 2020-05-19 NOTE — Anesthesia Preprocedure Evaluation (Signed)
Anesthesia Evaluation  Patient identified by MRN, date of birth, ID band Patient awake    Reviewed: Allergy & Precautions, NPO status , Patient's Chart, lab work & pertinent test results  Airway Mallampati: II  TM Distance: >3 FB     Dental  (+) Dental Advisory Given   Pulmonary former smoker,    breath sounds clear to auscultation       Cardiovascular negative cardio ROS   Rhythm:Regular Rate:Normal     Neuro/Psych CVA    GI/Hepatic negative GI ROS, Neg liver ROS,   Endo/Other  negative endocrine ROS  Renal/GU negative Renal ROS     Musculoskeletal  (+) Arthritis ,   Abdominal   Peds  Hematology negative hematology ROS (+)   Anesthesia Other Findings   Reproductive/Obstetrics                             Anesthesia Physical Anesthesia Plan  ASA: III  Anesthesia Plan: MAC   Post-op Pain Management:    Induction: Intravenous  PONV Risk Score and Plan: Ondansetron, Propofol infusion and Treatment may vary due to age or medical condition  Airway Management Planned: Natural Airway and Nasal Cannula  Additional Equipment:   Intra-op Plan:   Post-operative Plan:   Informed Consent: I have reviewed the patients History and Physical, chart, labs and discussed the procedure including the risks, benefits and alternatives for the proposed anesthesia with the patient or authorized representative who has indicated his/her understanding and acceptance.       Plan Discussed with: CRNA  Anesthesia Plan Comments:         Anesthesia Quick Evaluation

## 2020-05-19 NOTE — Progress Notes (Signed)
  Echocardiogram Echocardiogram Transesophageal has been performed.  Sandra Clay 05/19/2020, 9:29 AM

## 2020-05-19 NOTE — Anesthesia Procedure Notes (Signed)
Procedure Name: MAC Date/Time: 05/19/2020 9:05 AM Performed by: Georgia Duff, CRNA Pre-anesthesia Checklist: Patient identified, Emergency Drugs available, Suction available and Patient being monitored Oxygen Delivery Method: Nasal cannula Induction Type: IV induction Placement Confirmation: positive ETCO2 Dental Injury: Teeth and Oropharynx as per pre-operative assessment

## 2020-05-19 NOTE — Interval H&P Note (Signed)
History and Physical Interval Note:  05/19/2020 9:00 AM  Sandra Clay  has presented today for surgery, with the diagnosis of PFO.  The various methods of treatment have been discussed with the patient and family. After consideration of risks, benefits and other options for treatment, the patient has consented to  Procedure(s): TRANSESOPHAGEAL ECHOCARDIOGRAM (TEE) (N/A) as a surgical intervention.  The patient's history has been reviewed, patient examined, no change in status, stable for surgery.  I have reviewed the patient's chart and labs.  Questions were answered to the patient's satisfaction.     Maryfrances Portugal

## 2020-05-19 NOTE — Transfer of Care (Signed)
Immediate Anesthesia Transfer of Care Note  Patient: Sandra Clay  Procedure(s) Performed: TRANSESOPHAGEAL ECHOCARDIOGRAM (TEE) (N/A ) BUBBLE STUDY  Patient Location: PACU and Endoscopy Unit  Anesthesia Type:MAC  Level of Consciousness: awake, alert  and patient cooperative  Airway & Oxygen Therapy: Patient Spontanous Breathing and Patient connected to nasal cannula oxygen  Post-op Assessment: Report given to RN and Post -op Vital signs reviewed and stable  Post vital signs: Reviewed and stable  Last Vitals:  Vitals Value Taken Time  BP 121/100 05/19/20 0922  Temp    Pulse 84 05/19/20 0928  Resp 16 05/19/20 0928  SpO2 100 % 05/19/20 0928  Vitals shown include unvalidated device data.  Last Pain:  Vitals:   05/19/20 0803  TempSrc: Temporal  PainSc: 0-No pain         Complications: No complications documented.

## 2020-05-19 NOTE — Op Note (Signed)
INDICATIONS: embolic stroke  PROCEDURE:   Informed consent was obtained prior to the procedure. The risks, benefits and alternatives for the procedure were discussed and the patient comprehended these risks.  Risks include, but are not limited to, cough, sore throat, vomiting, nausea, somnolence, esophageal and stomach trauma or perforation, bleeding, low blood pressure, aspiration, pneumonia, infection, trauma to the teeth and death.    After a procedural time-out, the oropharynx was anesthetized with 20% benzocaine spray.   During this procedure the patient was administered IV propofol by Anesthesiology (Dr. Ola Spurr).  The transesophageal probe was inserted in the esophagus and stomach without difficulty and multiple views were obtained.  The patient was kept under observation until the patient left the procedure room.  The patient left the procedure room in stable condition.   Agitated microbubble saline contrast was administered.  COMPLICATIONS:    There were no immediate complications.  FINDINGS:  Aneurysmal atrial septum. Small PFO with tiny left to right shunt at rest and prompt right to left shunt with provocative maneuvers. Otherwise normal TEE.  RECOMMENDATIONS:     Consider atrial septal occluder.  Time Spent Directly with the Patient:  30 minutes   Joanna Borawski 05/19/2020, 9:18 AM

## 2020-05-19 NOTE — Discharge Instructions (Signed)
Transesophageal Echocardiogram Transesophageal echocardiogram (TEE) is a test that uses sound waves to take pictures of your heart. TEE is done by passing a small probe attached to a flexible tube down the part of the body that moves food from your mouth to your stomach (esophagus). The pictures give clear images of your heart. This can help your doctor see if there are problems with your heart. Tell a doctor about:  Any allergies you have.  All medicines you are taking. This includes vitamins, herbs, eye drops, creams, and over-the-counter medicines.  Any problems you or family members have had with anesthetic medicines.  Any blood disorders you have.  Any surgeries you have had.  Any medical conditions you have.  Any swallowing problems.  Whether you have or have had a blockage in the part of the body that moves food from your mouth to your stomach.  Whether you are pregnant or may be pregnant. What are the risks? In general, this is a safe procedure. But, problems may occur, such as:  Damage to nearby structures or organs.  A tear in the part of the body that moves food from your mouth to your stomach.  Irregular heartbeat.  Hoarse voice or trouble swallowing.  Bleeding. What happens before the procedure? Medicines  Ask your doctor about changing or stopping: ? Your normal medicines. ? Vitamins, herbs, and supplements. ? Over-the-counter medicines.  Do not take aspirin or ibuprofen unless you are told to. General instructions  Follow instructions from your doctor about what you cannot eat or drink.  You will take out any dentures or dental retainers.  Plan to have a responsible adult take you home from the hospital or clinic.  Plan to have a responsible adult care for you for the time you are told after you leave the hospital or clinic. This is important. What happens during the procedure?  An IV will be put into one of your veins.  You may be given: ? A  sedative. This medicine helps you relax. ? A medicine to numb the back of your throat. This may be sprayed or gargled.  Your blood pressure, heart rate, and breathing will be watched.  You may be asked to lie on your left side.  A bite block will be placed in your mouth. This keeps you from biting the tube.  The tip of the probe will be placed into the back of your mouth.  You will be asked to swallow.  Your doctor will take pictures of your heart.  The probe and bite block will be taken out after the test is done. The procedure may vary among doctors and hospitals.   What can I expect after the procedure?  You will be monitored until you leave the hospital or clinic. This includes checking your blood pressure, heart rate, breathing rate, and blood oxygen level.  Your throat may feel sore and numb. This will get better over time. You will not be allowed to eat or drink until the numbness has gone away.  It is common to have a sore throat for a day or two.  It is up to you to get the results of your procedure. Ask how to get your results when they are ready. Follow these instructions at home:  If you were given a sedative during your procedure, do not drive or use machines until your doctor says that it is safe.  Return to your normal activities when your doctor says that it is safe.    Keep all follow-up visits. Summary  TEE is a test that uses sound waves to take pictures of your heart.  You will be given a medicine to help you relax.  Do not drive or use machines until your doctor says that it is safe. This information is not intended to replace advice given to you by your health care provider. Make sure you discuss any questions you have with your health care provider. Document Revised: 10/26/2019 Document Reviewed: 10/26/2019 Elsevier Patient Education  2021 [employer]  

## 2020-05-19 NOTE — Anesthesia Postprocedure Evaluation (Signed)
Anesthesia Post Note  Patient: Sandra Clay  Procedure(s) Performed: TRANSESOPHAGEAL ECHOCARDIOGRAM (TEE) (N/A ) BUBBLE STUDY     Patient location during evaluation: PACU Anesthesia Type: MAC Level of consciousness: awake and alert Pain management: pain level controlled Vital Signs Assessment: post-procedure vital signs reviewed and stable Respiratory status: spontaneous breathing, nonlabored ventilation, respiratory function stable and patient connected to nasal cannula oxygen Cardiovascular status: stable and blood pressure returned to baseline Postop Assessment: no apparent nausea or vomiting Anesthetic complications: no   No complications documented.  Last Vitals:  Vitals:   05/19/20 0930 05/19/20 0940  BP: (!) 145/69 (!) 142/63  Pulse: 80 76  Resp: 12 14  Temp:    SpO2: 100% 100%    Last Pain:  Vitals:   05/19/20 0940  TempSrc:   PainSc: 0-No pain                 Tiajuana Amass

## 2020-05-20 ENCOUNTER — Encounter (INDEPENDENT_AMBULATORY_CARE_PROVIDER_SITE_OTHER): Payer: 59

## 2020-05-20 DIAGNOSIS — I4891 Unspecified atrial fibrillation: Secondary | ICD-10-CM

## 2020-05-20 DIAGNOSIS — Z8673 Personal history of transient ischemic attack (TIA), and cerebral infarction without residual deficits: Secondary | ICD-10-CM

## 2020-05-20 DIAGNOSIS — I639 Cerebral infarction, unspecified: Secondary | ICD-10-CM | POA: Diagnosis not present

## 2020-05-22 ENCOUNTER — Ambulatory Visit: Payer: 59 | Admitting: Physical Therapy

## 2020-05-22 ENCOUNTER — Ambulatory Visit: Payer: 59 | Attending: Family Medicine | Admitting: Occupational Therapy

## 2020-05-22 ENCOUNTER — Other Ambulatory Visit: Payer: Self-pay

## 2020-05-22 ENCOUNTER — Encounter: Payer: Self-pay | Admitting: Physical Therapy

## 2020-05-22 ENCOUNTER — Encounter: Payer: Self-pay | Admitting: Occupational Therapy

## 2020-05-22 DIAGNOSIS — R2681 Unsteadiness on feet: Secondary | ICD-10-CM | POA: Insufficient documentation

## 2020-05-22 DIAGNOSIS — R41842 Visuospatial deficit: Secondary | ICD-10-CM | POA: Insufficient documentation

## 2020-05-22 DIAGNOSIS — R2689 Other abnormalities of gait and mobility: Secondary | ICD-10-CM

## 2020-05-22 DIAGNOSIS — M6281 Muscle weakness (generalized): Secondary | ICD-10-CM | POA: Diagnosis not present

## 2020-05-22 DIAGNOSIS — I69854 Hemiplegia and hemiparesis following other cerebrovascular disease affecting left non-dominant side: Secondary | ICD-10-CM | POA: Insufficient documentation

## 2020-05-22 DIAGNOSIS — R278 Other lack of coordination: Secondary | ICD-10-CM | POA: Diagnosis not present

## 2020-05-22 NOTE — Therapy (Signed)
[facility] [address] [place], [place], [address] Phone: [phone]   Fax:  [phone]  Occupational Therapy Treatment  Patient Details  Name: Sandra Clay MRN: [id number] Date of Birth: Dec 20, 1955 Referring Provider (OT): Dimas Chyle, MD   Encounter Date: 05/22/2020   OT End of Session - 05/22/20 1019    Visit Number 6    Number of Visits 7    Date for OT Re-Evaluation 06/02/20    Authorization Type [facility]    Authorization Time Period $20 copay VL:[id number]    OT Start Time 1018    OT Stop Time 1056    OT Time Calculation (min) 38 min    Activity Tolerance Patient tolerated treatment well    Behavior During Therapy [facility] for tasks assessed/performed           Past Medical History:  Diagnosis Date  . Anxiety   . Cancer ([place])    skin cancer   . Cataract    left forming   . Depression    situational , resolved   . Headache(784.0)   . Osteoarthritis   . PONV (postoperative nausea and vomiting)   . Stroke [facility]) 03/2020    Past Surgical History:  Procedure Laterality Date  . BUNIONECTOMY    . CESAREAN SECTION     x3  . CHOLECYSTECTOMY    . COLONOSCOPY     ~51 ish per pt  bucini  . FRACTURE SURGERY    . KNEE ARTHROSCOPY W/ MENISCAL REPAIR Right    x2  . ROTATOR CUFF REPAIR  2018   right   . TOTAL HIP ARTHROPLASTY Left 08/03/2018   Procedure: TOTAL HIP ARTHROPLASTY ANTERIOR APPROACH;  Surgeon: Gaynelle Arabian, MD;  Location: [facility] ORS;  Service: Orthopedics;  Laterality: Left;  . TUBAL LIGATION    . tubal reversal      There were no vitals filed for this visit.   Subjective Assessment - 05/22/20 1020    Subjective  Pt denies any pain. 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 No/denies                         OT Treatments/Exercises (OP) - 05/22/20 1022      Cognitive Exercises   Keyboarding typing test on typing mastere with 32 wpm and 89% accuracy with 29 wpm net speed - decline from previous time on 05/08/20      Visual/Perceptual Exercises   Scanning Environmental    Scanning - Environmental tossing ball and ambulating while completing environmental scanning with 94% accuracy (15/16)      Fine Motor Coordination (Hand/Wrist)   Fine Motor Coordination Small Pegboard;Grooved pegs    Small Pegboard with LUE and placing into peg board with min drops and difficulty. Pt help paperclip in palm of hand to encourage tip pinch. pt required increased time    Grooved pegs with LUE with use of tweezers with mod difficulty for rotating with use of tweezers.                  OT Education - 05/22/20 1046    Education Details issued red theraband - see pt instructions    Person(s)  Educated Patient    Methods Explanation;Demonstration;Verbal cues;Handout    Comprehension Verbalized understanding;Returned demonstration;Verbal cues required            OT Short Term Goals - 05/22/20 1031      OT SHORT TERM GOAL #1   Title Pt will be independent with HEP 05/12/20    Time 3    Period Weeks    Status Achieved    Target Date 05/12/20      OT SHORT TERM GOAL #2   Title Pt will report less drops from LUE during functional daily tasks.    Time 3    Period Weeks    Status Achieved   pt reports about the same     OT SHORT TERM GOAL #3   Title Pt will complete tabletop scanning with 85% accuracy or greater for increase in attention to detail and left attention.    Baseline 65% accuracy    Time 3    Period Weeks    Status Achieved   100% on 05/08/2020     OT SHORT TERM GOAL #4   Title Pt will improve typing speed and accuracy to 35wpm with 50% or greater accuracy on typing test    Baseline 31 wpm 30% accuracy 9 wpm net speed    Time 3    Period Weeks    Status  Achieved   38% and 86% accuracy     OT SHORT TERM GOAL #5   Title Pt will report increased ease and coordination with playing piano.    Time 3    Period Weeks    Status Achieved   says practicing but it hasn't gotten much better            OT Long Term Goals - 05/22/20 1032      OT LONG TERM GOAL #1   Title Pt will be independent with updated HEP 06/02/20    Time 6    Period Weeks    Status On-going      OT LONG TERM GOAL #2   Title Pt will improve grip strength in LUE to 60 lbs or greater for increase in overall functional use for daily activities.    Baseline 52lbs    Time 6    Period Weeks    Status Achieved   71.4 lbs with LUE on 05/22/20     OT LONG TERM GOAL #3   Title Pt will perform environmental scanning with 100% accuracy or greater with added component (physical or cognitive)    Baseline 100% accuracy in min distracting environment    Time 6    Period Weeks    Status On-going   93% with tossing ball     OT LONG TERM GOAL #4   Title Pt will complete FOTO and score 70% or higher    Baseline 59%    Time 6    Period Weeks    Status New      OT LONG TERM GOAL #5   Title Pt will increase 9 hole peg test score to 23 seconds or less with LUE for increase in fine motor coordination    Baseline 28.12s    Time 6    Period Weeks    Status On-going   25.46s on 05/08/2020     OT LONG TERM GOAL #6   Title Pt will demonstrate improved typing speed to 45 wpm with 75% or greater accuracy on typing test    Baseline 31 wpm 30%  accuracy (9wpm net speed)    Time 6    Period Weeks    Status On-going   32 wpm and 89% accuracy with 29wpm net speed                Plan - 05/22/20 1030    Clinical Impression Statement Pt has progressed towards goals - pt has met grip strength goal and continues to progress towards remaining goals.    OT Occupational Profile and History Problem Focused Assessment - Including review of records relating to presenting problem     Occupational performance deficits (Please refer to evaluation for details): Leisure;Work;IADL's    Body Structure / Function / Physical Skills UE functional use;Decreased knowledge of use of DME;Dexterity;Vision;FMC;Coordination;Strength;GMC    Rehab Potential Good    Clinical Decision Making Limited treatment options, no task modification necessary    Comorbidities Affecting Occupational Performance: None    Modification or Assistance to Complete Evaluation  No modification of tasks or assist necessary to complete eval    OT Frequency 1x / week    OT Duration 6 weeks    OT Treatment/Interventions Therapeutic exercise;Neuromuscular education;Therapeutic activities;DME and/or AE instruction;Patient/family education    Plan proximal strength HEP review - typing test, check remaining goals. 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 PFO (patent foramen ovale)   . Cerebrovascular accident (CVA) ([place]) 04/17/2020  . History of hip replacement 08/18/2018  . OA (osteoarthritis) of hip 08/03/2018  . Hyperglycemia 05/18/2018  . Skin lump of leg, right 12/30/2017  . Genital herpes 12/30/2017    Zachery Conch MOT, OTR/L  05/22/2020, 11:14 AM  [facility] [address] [place], [place], [address] Phone: [phone]   Fax:  [phone]  Name: PHEONIX WISBY MRN: [id number] Date of Birth: Jun 29, 1955

## 2020-05-22 NOTE — Therapy (Signed)
[facility] [address] [place], [place], [address] Phone: [phone]   Fax:  [phone]  Physical Therapy Treatment/Discharge Summary  Patient Details  Name: Sandra Clay MRN: [id number] Date of Birth: 30-Jun-1955 Referring Provider (PT): Stron[place]  Visits from Start of Care: 4  Current functional level related to goals / functional outcomes: See goals below.  Pt has met all goals.   Remaining deficits: Occasional high level balance-pt needs to concentrate, per pt report   Education / Equipment: Educated in [employer], fall prevention  Plan: Patient agrees to discharge.  Patient goals were met. Patient is being discharged due to meeting the stated rehab goals.  ?????            Mady Haagensen, PT 05/22/20 3:20 PM Phone: [phone] Fax: [phone]     Encounter Date: 05/22/2020   PT End of Session - 05/22/20 1104    Visit Number 4    Number of Visits 9    Authorization Type [place] [employer]    PT Start Time 1102    PT Stop Time 1135    PT Time Calculation (min) 33 min    Equipment Utilized During Treatment --    Activity Tolerance Patient tolerated treatment well    Behavior During Therapy [facility] for tasks assessed/performed           Past Medical History:  Diagnosis Date   Anxiety    Cancer ([place])    skin cancer    Cataract    left forming    Depression    situational , resolved    Headache(784.0)    Osteoarthritis    PONV (postoperative nausea and vomiting)    Stroke ([place]) 03/2020    Past Surgical History:  Procedure Laterality Date   BUBBLE STUDY  05/19/2020   Procedure: BUBBLE STUDY;  Surgeon: Sanda Klein, MD;  Location: [facility];  Service: Cardiovascular;;   BUNIONECTOMY     CESAREAN SECTION     x3   CHOLECYSTECTOMY     COLONOSCOPY     ~51 ish per pt  bucini   FRACTURE SURGERY     KNEE ARTHROSCOPY W/ MENISCAL REPAIR Right     x2   ROTATOR CUFF REPAIR  2018   right    TEE WITHOUT CARDIOVERSION N/A 05/19/2020   Procedure: TRANSESOPHAGEAL ECHOCARDIOGRAM (TEE);  Surgeon: Sanda Klein, MD;  Location: [facility];  Service: Cardiovascular;  Laterality: N/A;   TOTAL HIP ARTHROPLASTY Left 08/03/2018   Procedure: TOTAL HIP ARTHROPLASTY ANTERIOR APPROACH;  Surgeon: Gaynelle Arabian, MD;  Location[facility];  Service: Orthopedics;  Laterality: Left;   TUBAL LIGATION     tubal reversal      There were no vitals filed for this visit.   Subjective Assessment - 05/22/20 1103    Subjective Had the TEE, and there is a hole.  To hear from the cardiologist soon.  Have started back to the elliptical machine.  Feel like the balance is overall better.  Wearing the monitor, for 30 days.  Would like to d/c from PT today if possible.    How long can you sit comfortably? no problems    How long can you stand comfortably? no problems    How long can you walk comfortably? no problems    Diagnostic tests MRI; recent imaging shows PFO, per pt report    Patient Stated Goals Pt's goals for PT are to improve knowledge for being safer  with balance; don't want to fall.    Currently in Pain? No/denies              [facility] PT Assessment - 05/22/20 0001      Functional Gait  Assessment   Gait assessed  Yes    Gait Level Surface Walks 20 ft in less than 7 sec but greater than 5.5 sec, uses assistive device, slower speed, mild gait deviations, or deviates 6-10 in outside of the 12 in walkway width.   5.94   Change in Gait Speed Able to smoothly change walking speed without loss of balance or gait deviation. Deviate no more than 6 in outside of the 12 in walkway width.    Gait with Horizontal Head Turns Performs head turns smoothly with no change in gait. Deviates no more than 6 in outside 12 in walkway width    Gait with Vertical Head Turns Performs head turns with no change in gait. Deviates no more than 6 in outside 12 in walkway width.     Gait and Pivot Turn Pivot turns safely within 3 sec and stops quickly with no loss of balance.    Step Over Obstacle Is able to step over one shoe box (4.5 in total height) without changing gait speed. No evidence of imbalance.    Gait with Narrow Base of Support Is able to ambulate for 10 steps heel to toe with no staggering.    Gait with Eyes Closed Walks 20 ft, uses assistive device, slower speed, mild gait deviations, deviates 6-10 in outside 12 in walkway width. Ambulates 20 ft in less than 9 sec but greater than 7 sec.   9.6   Ambulating Backwards Walks 20 ft, no assistive devices, good speed, no evidence for imbalance, normal gait   13.62   Steps Alternating feet, no rail.    Total Score 27    FGA comment: Improved from 20/30           Access Code: [email or web address] URL: [email or web address] Date: 05/22/2020 Prepared by: Mady Haagensen  Reviewed Exercises, in initial HEP, with pt return demo understanding Heel Toe Raises with Counter Support - 1 x daily - 7 x weekly - 3 sets - 10 reps Heel Toe Raises with Unilateral Counter Support - 1 x daily - 5 x weekly - 3 sets - 10 reps Sit to Stand with Arms Crossed - 1 x daily - 5 x weekly - 3 sets - 10 reps Bilateral Long Arc Quad - 1 x daily - 5 x weekly - 3 sets - 10 reps (discussed/pt demo progression:  Use of green theraband, or hold time of 3 seconds each rep) Standing Hip Abduction - 1 x daily - 5 x weekly - 3 sets - 10 reps  Added 05/22/2020: Single Leg Stance - 1 x daily - 5 x weekly - 1 sets - 3 reps - 3 sec hold Walking Tandem Stance - 1 x daily - 5 x weekly - 1 sets - 3 reps Walking with Head Rotation - 1 x daily - 5 x weekly - 1 sets - 3 reps  Assessed vestibular system for balance:  EO and EC standing on solid surface x 30 seconds EO and EC standing on foam surface x 30 seconds  No LOB in any situation above.  SLS multiple reps, no UE support 2.5-3 seconds, 3 reps, then with cues for abdominal activation and use of  visual target, 10 sec each leg x 1 rep  [facility] Adult PT Treatment/Exercise - 05/22/20 0001      Ambulation/Gait   Ambulation/Gait Yes    Ambulation/Gait Assistance 7: Independent    Ambulation/Gait Assistance Details in-clinic distances    Gait Comments Reports ambulating at least one block outdoors, no LOB.      Self-Care   Self-Care Other Self-Care Comments    Other Self-Care Comments  Discussed progress towards goals, POC, discussed fall prevention.  Pt reports 90% improvement in nausea/motion sickness feeling.  Pt and PT in agreement to d/c today, as pt is more confident with gait and balance and feels she is independent with progression of HEP on her own.                  PT Education - 05/22/20 1228    Education Details Educated in additions to HEP; discussed POC and plans for d/c today; fall prevention discussed    Person(s) Educated Patient    Methods Explanation;Demonstration;Handout    Comprehension Verbalized understanding;Returned demonstration            PT Short Term Goals - 05/22/20 1121      PT SHORT TERM GOAL #1   Title Pt will be independent with HEP for improved strength, balance,and gait.  TARGET 06/02/2020    Time 4    Period Weeks    Status Achieved      PT SHORT TERM GOAL #2   Title Pt will improve FGA score to at least 23/30 to decrease fall risk.    Baseline 20/30 at eval    Time 4    Period Weeks    Status Achieved      PT SHORT TERM GOAL #3   Title Pt will improve SLS on LLE to at least 3 seconds for improved balance, obstacle, and stair negotiation.    Time 4    Period Weeks    Status Achieved      PT SHORT TERM GOAL #4   Title Further balance/vestibular testing to be assessed, with goal to be written as appropriate (consider Sensory Organization Test)    Baseline no  difficulties MCTSIB    Time 4    Period Weeks    Status Achieved      PT SHORT TERM GOAL #5   Title Pt will verbalize understanding of  fall prevention in home environment.    Time 4    Period Weeks    Status Achieved             PT Long Term Goals - 05/22/20 1227      PT LONG TERM GOAL #1   Title Pt will be independent with progression of HEP for improved strength, balance, and gait.  TARGET 06/30/2020    Time 8    Period Weeks    Status Achieved      PT LONG TERM GOAL #2   Title Pt will ambulate at least 1000 ft, indoor, and outdoor surfaces independently, no LOB for improved community gait.    Baseline achieved per patient report    Time 8    Period Weeks    Status Achieved      PT LONG TERM GOAL #3   Title FOTO score to improve by at least 10% for improved functional mobility overall.    Baseline Deferred-FOTO questions are related to UE and OT will ask next visit    Time 8    Period Weeks    Status Deferred      PT LONG  TERM GOAL #4   Title Pt will report overall at least 50% improvement in nausea/motion sickness with dynamic gait and balance activities.    Baseline 90% better    Time 8    Period Weeks    Status Achieved                 Plan - 05/22/20 1229    Clinical Impression Statement Pt reporting no difficulties with functional tasks outside of therapy today, requesting d/c from PT this visit.  Assessed STGs and LTGs:  pt has met all 4 of 4 STGs, of note, FGA improved from 20/30 to 27/30.  Pt has met 3 of 3 LTGs (LTG 3 deferred, as [facility] is being addressed by OT).  Upgraded HEP today and pt demo understanding.  She has made significant progress in fucntional mobility measures since eval and she is apporpriate for d/c from PT this visit.    Personal Factors and Comorbidities Comorbidity 3+    Comorbidities mild hyperlipidemia, R torn meniscus (was to have R knee surgery in March 22, but cancelled), R RTC repair, L THA    Examination-Activity Limitations Locomotion Level;Stand;Squat    Examination-Participation Restrictions Community Activity;Yard Work;Other   Taking care of dog    Stability/Clinical Decision Making Evolving/Moderate complexity    Rehab Potential Good    PT Frequency 1x / week    PT Duration 8 weeks   plus eval   PT Treatment/Interventions ADLs/Self Care Home Management;Gait training;Stair training;Functional mobility training;Therapeutic activities;Therapeutic exercise;Balance training;Neuromuscular re-education;Patient/family education;Vestibular    PT Next Visit Plan D/C PT this visit.    Consulted and Agree with Plan of Care Patient           Patient will benefit from skilled therapeutic intervention in order to improve the following deficits and impairments:  Abnormal gait,Difficulty walking,Decreased balance,Decreased mobility,Decreased strength  Visit Diagnosis: Other abnormalities of gait and mobility     Problem List Patient Active Problem List   Diagnosis Date Noted   PFO (patent foramen ovale)    Cerebrovascular accident (CVA) ([place]) 04/17/2020   History of hip replacement 08/18/2018   OA (osteoarthritis) of hip 08/03/2018   Hyperglycemia 05/18/2018   Skin lump of leg, right 12/30/2017   Genital herpes 12/30/2017    Kol Consuegra W. 05/22/2020, 3:16 PM  Frazier Butt., PT   [facility] [address] [place], [place], [address] Phone: [phone]   Fax:  [phone]  Name: ROYCE SCIARA MRN: [id number] Date of Birth: 22-Apr-1955

## 2020-05-22 NOTE — Patient Instructions (Signed)

## 2020-05-22 NOTE — Patient Instructions (Addendum)
Access Code: [email or web address] URL: [email or web address] Date: 05/22/2020 Prepared by: Mady Haagensen  Exercises Heel Toe Raises with Counter Support - 1 x daily - 7 x weekly - 3 sets - 10 reps Heel Toe Raises with Unilateral Counter Support - 1 x daily - 5 x weekly - 3 sets - 10 reps Sit to Stand with Arms Crossed - 1 x daily - 5 x weekly - 3 sets - 10 reps Bilateral Long Arc Quad - 1 x daily - 5 x weekly - 3 sets - 10 reps Standing Hip Abduction - 1 x daily - 5 x weekly - 3 sets - 10 reps  Added 05/22/2020: Single Leg Stance - 1 x daily - 5 x weekly - 1 sets - 3 reps - 3 sec hold Walking Tandem Stance - 1 x daily - 5 x weekly - 1 sets - 3 reps Walking with Head Rotation - 1 x daily - 5 x weekly - 1 sets - 3 reps

## 2020-05-22 NOTE — Progress Notes (Signed)
I have reviewed the patient's transesophageal echo study.  I have spoken with her about the results of her study as this demonstrates the presence of a moderate sized PFO with associated atrial septal aneurysm and brisk right to left shunting.  We reviewed treatment options which include ongoing medical therapy including antiplatelet therapy, avoidance of any further venous procedures, and consideration of transcatheter closure.  The patient strongly prefers treatment with device closure.  I have discussed risks, indications, and alternatives with her at length.  I would like her to complete her 30-day monitor to make sure that she is not having any atrial arrhythmias that would require oral anticoagulation.  Once her monitor is completed, she could be scheduled for transcatheter PFO closure.  She should start clopidogrel 75 mg daily approximately 5 days before the procedure.  Sherren Mocha 05/22/2020 2:22 PM

## 2020-05-23 ENCOUNTER — Telehealth: Payer: Self-pay | Admitting: Cardiovascular Disease

## 2020-05-23 NOTE — Telephone Encounter (Signed)
Pt called in and would like to see if she could talk to [facility] about sching a PSO Closure .  She has some question and would like to try to get this sch'd.  Pt is going to [place] soon nerves to get on a plane for 7 hours without talking though this   Best number [phone]

## 2020-05-23 NOTE — Telephone Encounter (Signed)
The patient is going on a trip to [place] in July and would like her closure completed before then if possible. She would like to go ahead and get in the books to hold her spot and cancel if Dr. Burt Knack thinks it should be cancelled based on monitor results.  She requests 06/30/20 date.  She understands the rep will be asked if he is available that day and it will be confirmed with her. She was grateful for call and agrees with plan.

## 2020-05-29 ENCOUNTER — Ambulatory Visit: Payer: 59 | Admitting: Physical Therapy

## 2020-05-29 ENCOUNTER — Ambulatory Visit: Payer: 59 | Admitting: Occupational Therapy

## 2020-05-29 ENCOUNTER — Encounter: Payer: Self-pay | Admitting: Occupational Therapy

## 2020-05-29 ENCOUNTER — Other Ambulatory Visit: Payer: Self-pay

## 2020-05-29 DIAGNOSIS — R2681 Unsteadiness on feet: Secondary | ICD-10-CM | POA: Diagnosis not present

## 2020-05-29 DIAGNOSIS — R41842 Visuospatial deficit: Secondary | ICD-10-CM | POA: Diagnosis not present

## 2020-05-29 DIAGNOSIS — M6281 Muscle weakness (generalized): Secondary | ICD-10-CM

## 2020-05-29 DIAGNOSIS — R278 Other lack of coordination: Secondary | ICD-10-CM | POA: Diagnosis not present

## 2020-05-29 DIAGNOSIS — I69854 Hemiplegia and hemiparesis following other cerebrovascular disease affecting left non-dominant side: Secondary | ICD-10-CM | POA: Diagnosis not present

## 2020-05-29 DIAGNOSIS — R2689 Other abnormalities of gait and mobility: Secondary | ICD-10-CM

## 2020-05-29 NOTE — Therapy (Signed)
[facility] [address] [place], [place], [address] Phone: [phone]   Fax:  [phone]  Occupational Therapy Treatment & Discharge Patient Details  Name: Sandra Clay MRN: [id number] Date of Birth: February 20, 1956 Referring Provider (OT): Dimas Chyle, MD   Encounter Date: 05/29/2020   OT End of Session - 05/29/20 1017    Visit Number 7    Number of Visits 7    Date for OT Re-Evaluation 06/02/20    Authorization Type [facility]    Authorization Time Period $20 copay VL:[id number]    OT Start Time 1017    OT Stop Time 1040   d/c session   OT Time Calculation (min) 23 min    Activity Tolerance Patient tolerated treatment well    Behavior During Therapy [facility] for tasks assessed/performed           Past Medical History:  Diagnosis Date  . Anxiety   . Cancer ([place])    skin cancer   . Cataract    left forming   . Depression    situational , resolved   . Headache(784.0)   . Osteoarthritis   . PONV (postoperative nausea and vomiting)   . Stroke [facility]) 03/2020    Past Surgical History:  Procedure Laterality Date  . BUBBLE STUDY  05/19/2020   Procedure: BUBBLE STUDY;  Surgeon: Sanda Klein, MD;  [facility];  Service: Cardiovascular;;  . BUNIONECTOMY    . CESAREAN SECTION     x3  . CHOLECYSTECTOMY    . COLONOSCOPY     ~51 ish per pt  bucini  . FRACTURE SURGERY    . KNEE ARTHROSCOPY W/ MENISCAL REPAIR Right    x2  . ROTATOR CUFF REPAIR  2018   right   . TEE WITHOUT CARDIOVERSION N/A 05/19/2020   Procedure: TRANSESOPHAGEAL ECHOCARDIOGRAM (TEE);  Surgeon: Sanda Klein, MD;  Location: [facility];  Service: Cardiovascular;  Laterality: N/A;  . TOTAL HIP ARTHROPLASTY Left 08/03/2018   Procedure: TOTAL HIP ARTHROPLASTY ANTERIOR APPROACH;  Surgeon: Gaynelle Arabian, MD;  [facility];  Service: Orthopedics;  Laterality: Left;  . TUBAL LIGATION    . tubal reversal      There were no vitals filed for this  visit.   Subjective Assessment - 05/29/20 1017    Subjective  Pt says shes "doing good" April 15th scheduled the heart surgery    Pertinent History PMH mild HLD, no hx of HTN but blood pressure readings high at time of stroke    Patient Stated Goals Typing and playing piano    Currently in Pain? No/denies              OCCUPATIONAL THERAPY DISCHARGE SUMMARY  Visits from Start of Care: 7  Current functional level related to goals / functional outcomes: Pt has made great progress with meeting all STGs and 5/6 LTGs and partially meeting 1/6 LTGs. Pt continues to have some decreased speed with typing but has made progress with overall accuracy and speed since evaluation.    Remaining deficits: Pt continues to have some decreased typing speed and coordination   Education / Equipment: HEPs for grip strength, coordination and proximal strength LUE  Plan: Patient agrees to discharge.  Patient goals were partially met. Patient is being discharged due to meeting the stated rehab goals.  ?????                          OT Short  Term Goals - 05/22/20 1031      OT SHORT TERM GOAL #1   Title Pt will be independent with HEP 05/12/20    Time 3    Period Weeks    Status Achieved    Target Date 05/12/20      OT SHORT TERM GOAL #2   Title Pt will report less drops from LUE during functional daily tasks.    Time 3    Period Weeks    Status Achieved   pt reports about the same     OT SHORT TERM GOAL #3   Title Pt will complete tabletop scanning with 85% accuracy or greater for increase in attention to detail and left attention.    Baseline 65% accuracy    Time 3    Period Weeks    Status Achieved   100% on 05/08/2020     OT SHORT TERM GOAL #4   Title Pt will improve typing speed and accuracy to 35wpm with 50% or greater accuracy on typing test    Baseline 31 wpm 30% accuracy 9 wpm net speed    Time 3    Period Weeks    Status Achieved   38% and 86% accuracy      OT SHORT TERM GOAL #5   Title Pt will report increased ease and coordination with playing piano.    Time 3    Period Weeks    Status Achieved   says practicing but it hasn't gotten much better            OT Long Term Goals - 05/29/20 1022      OT LONG TERM GOAL #1   Title Pt will be independent with updated HEP 06/02/20    Time 6    Period Weeks    Status Achieved      OT LONG TERM GOAL #2   Title Pt will improve grip strength in LUE to 60 lbs or greater for increase in overall functional use for daily activities.    Baseline 52lbs    Time 6    Period Weeks    Status Achieved   71.4 lbs with LUE on 05/22/20     OT LONG TERM GOAL #3   Title Pt will perform environmental scanning with 100% accuracy or greater with added component (physical or cognitive)    Baseline 100% accuracy in min distracting environment    Time 6    Period Weeks    Status Achieved   100% accuracy with consecutive numbers     OT LONG TERM GOAL #4   Title Pt will complete FOTO and score 70% or higher    Baseline 59%    Time 6    Period Weeks    Status Achieved   77% functional score - 23% impairment     OT LONG TERM GOAL #5   Title Pt will increase 9 hole peg test score to 23 seconds or less with LUE for increase in fine motor coordination    Baseline 28.12s    Time 6    Period Weeks    Status Achieved   22.10 seconds on 05/29/2020     OT LONG TERM GOAL #6   Title Pt will demonstrate improved typing speed to 45 wpm with 75% or greater accuracy on typing test    Baseline 31 wpm 30% accuracy (9wpm net speed)    Time 6    Period Weeks    Status Partially Met  37 wpm and 83% accuracy with 33wpm net speed - met percentage accuracy                Plan - 05/29/20 1035    Clinical Impression Statement Pt has met all STGs and met 5/6 and partially met 1/6 LTGs. Pt has made improvements in area of LUE coordination, strength and overall attention and awareness.    OT Occupational Profile and  History Problem Focused Assessment - Including review of records relating to presenting problem    Occupational performance deficits (Please refer to evaluation for details): Leisure;Work;IADL's    Body Structure / Function / Physical Skills UE functional use;Decreased knowledge of use of DME;Dexterity;Vision;FMC;Coordination;Strength;GMC    Rehab Potential Good    Clinical Decision Making Limited treatment options, no task modification necessary    Comorbidities Affecting Occupational Performance: None    Modification or Assistance to Complete Evaluation  No modification of tasks or assist necessary to complete eval    OT Frequency 1x / week    OT Duration 6 weeks    OT Treatment/Interventions Therapeutic exercise;Neuromuscular education;Therapeutic activities;DME and/or AE instruction;Patient/family education    Plan discharge OT    OT Home Exercise Plan Coordination  HEP    Consulted and Agree with Plan of Care Patient           Patient will benefit from skilled therapeutic intervention in order to improve the following deficits and impairments:   Body Structure / Function / Physical Skills: UE functional use,Decreased knowledge of use of [place]       Visit Diagnosis: Other lack of coordination  Muscle weakness (generalized)  Unsteadiness on feet  Other abnormalities of gait and mobility    Problem List Patient Active Problem List   Diagnosis Date Noted  . PFO (patent foramen ovale)   . Cerebrovascular accident (CVA) ([place]) 04/17/2020  . History of hip replacement 08/18/2018  . OA (osteoarthritis) of hip 08/03/2018  . Hyperglycemia 05/18/2018  . Skin lump of leg, right 12/30/2017  . Genital herpes 12/30/2017    Zachery Conch MOT, OTR/L  05/29/2020, 10:45 AM  [facility] [address] [place], [place], [address] Phone: [phone]   Fax:   [phone]  Name: Sandra Clay MRN: [id number] Date of Birth: 1956/01/30

## 2020-06-22 ENCOUNTER — Telehealth: Payer: Self-pay | Admitting: Cardiovascular Disease

## 2020-06-22 NOTE — Telephone Encounter (Signed)
Informed the patient that Dr. Burt Knack will review her monitor and and she will be called to review instructions/initatiate Plavix/schedule Covid testing once reviewed.  She was grateful for call and agrees with plan.

## 2020-06-22 NOTE — Telephone Encounter (Signed)
Patient is requesting to go over instructions for 06/30/20 procedure with Dr. Burt Knack.

## 2020-06-23 ENCOUNTER — Other Ambulatory Visit (HOSPITAL_COMMUNITY): Payer: Self-pay

## 2020-06-23 MED ORDER — CLOPIDOGREL BISULFATE 75 MG PO TABS
75.0000 mg | ORAL_TABLET | Freq: Every day | ORAL | 1 refills | Status: AC
  Filled 2020-06-23: qty 90, 90d supply, fill #0
  Filled 2020-09-15: qty 90, 90d supply, fill #1

## 2020-06-23 NOTE — Telephone Encounter (Signed)
Reviewed monitor results with Dr. Burt Knack which demonstrated no afib so she will proceed with PFO closure 06/30/2020. She will START PLAVIX 75 mg daily this weekend. She will have Covid test 06/28/20 between 2PM and 3PM and come directly to [facility] for visit with Nell Range at 3:30PM to get/review instructions. She was grateful for call and agrees with plan.

## 2020-06-26 ENCOUNTER — Other Ambulatory Visit (HOSPITAL_COMMUNITY): Payer: Self-pay

## 2020-06-27 NOTE — Progress Notes (Signed)
[facility] [place]                                     Cardiology Office Note:    Date:  06/28/2020   ID:  AIMA MCWHIRT, DOB Nov 04, 1955, MRN [id number]  PCP:  Vivi Barrack, MD  [place] Cardiologist:  No primary care provider on file.  [facility] Electrophysiologist:  None   Referring MD: Vivi Barrack, MD   CC: discuss PFO closure   History of Present Illness:    Sandra Clay is a 65 y.o. female with a hx of cryptogenic stroke and pfo who presents to clinic to discuss PFO closure.   The patient has been previously healthy.  She developed sudden onset of double vision in January of this year.  This was associated with poor balance and a feeling that she was leaning to the left.  There was associated nausea but no vomiting.  An outpatient evaluation was undertaken.  A CAT scan of the head showed no significant abnormality.  MRI scan demonstrated a small right superior ventral midbrain infarct.  MRA of the brain subsequently showed no significant large vessel atherosclerotic disease.  A 2D echocardiogram showed no abnormalities.  The lower extremity venous duplex was negative for DVT.  The transcranial Doppler ultrasound study showed Spencer grade 4 right to left shunt consistent with PFO. Subsequent 30 day cardiac monitor was negative for afib. TEE on 05/19/20 showed Anatomically higher risk PFO with immediate R-->L shunt and atrial septal aneurysm.   She was evaluated by Dr. Burt Knack and felt to be a good candidate for PFO closure. This has been set up for 06/30/20.  Today she presents to clinic for for follow up. Doing well. No CP or SOB. No LE edema, orthopnea or PND. No dizziness or syncope. No blood in stool or urine. No palpitations. Anxious to get procedure over with so she can go to [place]. No recurrent neuro symptoms    Past Medical History:  Diagnosis Date  . Anxiety   . Cancer ([place])    skin cancer   . Cataract    left  forming   . Depression    situational , resolved   . Headache(784.0)   . Osteoarthritis   . PONV (postoperative nausea and vomiting)   . Stroke [facility]) 03/2020    Past Surgical History:  Procedure Laterality Date  . BUBBLE STUDY  05/19/2020   Procedure: BUBBLE STUDY;  Surgeon: Sanda Klein, MD;  Location: [facility];  Service: Cardiovascular;;  . BUNIONECTOMY    . CESAREAN SECTION     x3  . CHOLECYSTECTOMY    . COLONOSCOPY     ~51 ish per pt  bucini  . FRACTURE SURGERY    . KNEE ARTHROSCOPY W/ MENISCAL REPAIR Right    x2  . ROTATOR CUFF REPAIR  2018   right   . TEE WITHOUT CARDIOVERSION N/A 05/19/2020   Procedure: TRANSESOPHAGEAL ECHOCARDIOGRAM (TEE);  Surgeon: Sanda Klein, MD;  Location: [place];  Service: Cardiovascular;  Laterality: N/A;  . TOTAL HIP ARTHROPLASTY Left 08/03/2018   Procedure: TOTAL HIP ARTHROPLASTY ANTERIOR APPROACH;  Surgeon: Gaynelle Arabian, MD;  Location: [facility];  Service: Orthopedics;  Laterality: Left;  . TUBAL LIGATION    . tubal reversal      Current Medications: Current Meds  Medication Sig  . aspirin EC  81 MG tablet Take 81 mg by mouth in the morning. Swallow whole.  . clopidogrel (PLAVIX) 75 MG tablet Take 1 tablet (75 mg total) by mouth daily.  Marland Kitchen doxylamine, Sleep, (UNISOM) 25 MG tablet Take 25 mg by mouth at bedtime.  . rosuvastatin (CRESTOR) 20 MG tablet TAKE 1 TABLET BY MOUTH ONCE A DAY (Patient taking differently: Take 20 mg by mouth in the morning.)  . valACYclovir (VALTREX) 1000 MG tablet Take 1 g by mouth in the morning.     Allergies:   Dilaudid [hydromorphone] and Sulfa antibiotics   Social History   Socioeconomic History  . Marital status: Single    Spouse name: Not on file  . Number of children: Not on file  . Years of education: Not on file  . Highest education level: Not on file  Occupational History  . Occupation: Full time  Tobacco Use  . Smoking status: Former Research scientist (life sciences)  . Smokeless tobacco: Never Used   Substance and Sexual Activity  . Alcohol use: Yes    Alcohol/week: 1.0 - 2.0 standard drink    Types: 1 - 2 Glasses of wine per week    Comment: one to two glasses of wine per day   . Drug use: No  . Sexual activity: Yes    Birth control/protection: Post-menopausal  Other Topics Concern  . Not on file  Social History Narrative   Lives with significant other, Eulas Post.   Right Handed   Drinks 2-3 cups caffeine daily   Social Determinants of Health   Financial Resource Strain: Not on file  Food Insecurity: Not on file  Transportation Needs: Not on file  Physical Activity: Not on file  Stress: Not on file  Social Connections: Not on file     Family History: The patient's family history includes Alcohol abuse in her brother, father, mother, and sister; Arthritis in her father; Breast cancer in her maternal aunt and mother; Cancer in her brother and father; Colon cancer (age of onset: 68) in her sister; Depression in her brother and sister; Diabetes in her father; Drug abuse in her brother; Early death in her brother; Hearing loss in her brother and father. There is no history of Colon polyps, Esophageal cancer, Rectal cancer, or Stomach cancer.  ROS:   Please see the history of present illness.    All other systems reviewed and are negative.  EKGs/Labs/Other Studies Reviewed:    The following studies were reviewed today:  Echo 04/26/2020: IMPRESSIONS  1. Left ventricular ejection fraction, by estimation, is 60 to 65%. The  left ventricle has normal function. The left ventricle has no regional  wall motion abnormalities. Left ventricular diastolic parameters are  consistent with Grade I diastolic  dysfunction (impaired relaxation).  2. Right ventricular systolic function is normal. The right ventricular  size is normal.  3. The mitral valve is normal in structure. No evidence of mitral valve  regurgitation. No evidence of mitral stenosis.  4. The aortic valve is tricuspid.  Aortic valve regurgitation is not  visualized. No aortic stenosis is present.  5. The inferior vena cava is normal in size with greater than 50%  respiratory variability, suggesting right atrial pressure of 3 mmHg.  Transcranial Doppler 05/01/2020: Summary:  A vascular evaluation was performed. The right middle cerebral artery was  studied. An IV was inserted into the patient's left forearm. Verbal  informed consent was obtained.    A small to medium sized PFO is noted based on a partial curtain  effect in  the presence of the valsalva maneuver.  Angela Burke 4   MRI Brain IMPRESSION: Small late acute to subacute infarct of the right superior ventral midbrain.  Minor chronic microvascular ischemic changes.  MRA Brain: IMPRESSION: 1. Negative MRA of the head and neck. No large vessel occlusion or hemodynamically significant stenosis. 2. Mild distal small vessel atheromatous irregularity within the intracranial circulation. 3. Predominant fetal type origin of the PCAs with overall diminutive vertebrobasilar system.  _____________________  Cardiac monitor 3/5-46/22 Study Highlights  1. The basic rhythm is normal sinus with an average HR of 75 (range 55-137) bpm 2. No atrial fibrillation or flutter 3. No high-grade heart block or pathologic pauses 4. No significant arrhythmia identified  ____________________   TEE 06/23/20 IMPRESSIONS  1. Left ventricular ejection fraction, by estimation, is 60 to 65%. The  left ventricle has normal function. The left ventricle has no regional  wall motion abnormalities.  2. Right ventricular systolic function is normal. The right ventricular  size is normal.  3. No left atrial/left atrial appendage thrombus was detected.  4. The mitral valve is normal in structure. No evidence of mitral valve  regurgitation. No evidence of mitral stenosis.  5. The aortic valve is normal in structure. Aortic valve regurgitation is  not  visualized. No aortic stenosis is present.  6. The inferior vena cava is normal in size with greater than 50%  respiratory variability, suggesting right atrial pressure of 3 mmHg.  7. Evidence of atrial level shunting detected by color flow Doppler.  There is a small (3 mm defect) patent foramen ovale with bidirectional  shunting across atrial septum. The atrial septum is aneurysmal, with an  overall left to right excursion of 1.5  cm. There is prompt invagination of the fossa ovalis floor into the left  atrium with cough or straining, with immediate right to left shunt.   Conclusion(s)/Recommendation(s): Normal biventricular function without  evidence of hemodynamically significant valvular heart disease. 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 Consider device closure in a symptomatic patient.   EKG:  EKG is ordered today.  The ekg ordered today demonstrates sinus HR 71  Recent Labs: 09/09/2019: ALT 16; TSH 1.32 05/10/2020: BUN 17; Creatinine, Ser 0.84; Hemoglobin 14.0; Platelets 257; Potassium 4.3; Sodium 144  Recent Lipid Panel    Component Value Date/Time   CHOL 145 04/24/2020 1236   TRIG 220 (H) 04/24/2020 1236   HDL 68 04/24/2020 1236   CHOLHDL 2.1 04/24/2020 1236   CHOLHDL 3 09/09/2019 0834   VLDL 12.0 09/09/2019 0834   LDLCALC 43 04/24/2020 1236     Risk Assessment/Calculations:       Physical Exam:    VS:  BP 138/78   Pulse 71   Ht 5\' 6"  (1.676 m)   Wt 165 lb (74.8 kg)   SpO2 98%   BMI 26.63 kg/m     Wt Readings from Last 3 Encounters:  06/28/20 165 lb (74.8 kg)  05/19/20 164 lb (74.4 kg)  05/10/20 167 lb (75.8 kg)     GEN:  Well nourished, well developed in no acute distress. Looks younger than stated age 23: Normal NECK: No JVD; No carotid bruits LYMPHATICS: No lymphadenopathy CARDIAC: RRR, no murmurs, rubs, gallops RESPIRATORY:  Clear to auscultation without rales, wheezing or rhonchi  ABDOMEN: Soft, non-tender,  non-distended MUSCULOSKELETAL:  No edema; No deformity  SKIN: Warm and dry NEUROLOGIC:  Alert and oriented x 3 PSYCHIATRIC:  Normal affect   ASSESSMENT:  1. PFO (patent foramen ovale)   2. History of stroke   3. Pre-procedure lab exam    PLAN:    In order of problems listed above:  PFO with atrial septal aneurysm: she is being loaded with plavix. Continue on aspirin. Plan for PFO closure 06/30/20 with Dr. Burt Knack. She understands the need for SBE prophylaxis x 6 months after closure.   The patient is counseled about the association of PFO and cryptogenic stroke. Available clinical trial data is reviewed, specifically those trials comparing transcatheter PFO closure and medical therapy with antiplatelet drugs. The patient understands the potential benefit of PFO closure with respect to secondary stroke reduction compared with medical therapy alone. Specific risks of transcatheter PFO closure are reviewed with the patient. These risks include bleeding, infection, device embolization, stroke, cardiac perforation, tamponade, arrhythmia, MI, and late device erosion. She understands these serious risks occur at low incidence of < 1%.  The patient understands the need to take dual antiplatelet therapy with aspirin and clopidogrel together for a minimum period of 6 months following transcatheter PFO closure.  They understand the need to follow SBE prophylaxis per guidelines for a period of 6 months following PFO closure.  The patient provides full informed consent for the procedure.   Cryptogenic CVA: Continue statin and antiplatelets   Medication Adjustments/Labs and Tests Ordered: Current medicines are reviewed at length with the patient today.  Concerns regarding medicines are outlined above.  Orders Placed This Encounter  Procedures  . Basic metabolic panel  . CBC   No orders of the defined types were placed in this encounter.   Patient Instructions  Medication Instructions:  Your  physician recommends that you continue on your current medications as directed. Please refer to the Current Medication list given to you today.  *If you need a refill on your cardiac medications before your next appointment, please call your pharmacy*   Lab Work: Pre procedure labs today: BMET & CBC  If you have labs (blood work) drawn today and your tests are completely normal, you will receive your results only by: Marland Kitchen MyChart Message (if you have MyChart) OR . A paper copy in the mail If you have any lab test that is abnormal or we need to change your treatment, we will call you to review the results.   Testing/Procedures: None ordered   Follow-Up: At [facility], you and your health needs are our priority.  As part of our continuing mission to provide you with exceptional heart care, we have created designated Provider Care Teams.  These Care Teams include your primary Cardiologist (physician) and Advanced Practice Providers (APPs -  Physician Assistants and Nurse Practitioners) who all work together to provide you with the care you need, when you need it.  Your next appointment:    May 18th  The format for your next appointment:   In Person  Provider:   Nell Range, PA-C    Thank you for choosing [facility]!!   Trinidad Curet, RN [phone]   Other Instructions       Signed, Angelena Form, PA-C  06/28/2020 4:47 PM    [facility]

## 2020-06-27 NOTE — H&P (View-Only) (Signed)
Sandra Clay                                     Cardiology Office Note:    Date:  06/28/2020   ID:  Sandra Clay, DOB [birth year], MRN [id number]  PCP:  Vivi Barrack, MD  [place] Cardiologist:  No primary care provider on file.  [facility] Electrophysiologist:  None   Referring MD: Vivi Barrack, MD   CC: discuss PFO closure   History of Present Illness:    Sandra Clay is a 65 y.o. female with a hx of cryptogenic stroke and pfo who presents to clinic to discuss PFO closure.   The patient has been previously healthy.  She developed sudden onset of double vision in January of this year.  This was associated with poor balance and a feeling that she was leaning to the left.  There was associated nausea but no vomiting.  An outpatient evaluation was undertaken.  A CAT scan of the head showed no significant abnormality.  MRI scan demonstrated a small right superior ventral midbrain infarct.  MRA of the brain subsequently showed no significant large vessel atherosclerotic disease.  A 2D echocardiogram showed no abnormalities.  The lower extremity venous duplex was negative for DVT.  The transcranial Doppler ultrasound study showed Spencer grade 4 right to left shunt consistent with PFO. Subsequent 30 day cardiac monitor was negative for afib. TEE on 05/19/20 showed Anatomically higher risk PFO with immediate R-->L shunt and atrial septal aneurysm.   She was evaluated by Dr. Burt Knack and felt to be a good candidate for PFO closure. This has been set up for 06/30/20.  Today she presents to clinic for for follow up. Doing well. No CP or SOB. No LE edema, orthopnea or PND. No dizziness or syncope. No blood in stool or urine. No palpitations. Anxious to get procedure over with so she can go to [place]. No recurrent neuro symptoms    Past Medical History:  Diagnosis Date  . Anxiety   . Cancer ([place])    skin cancer   . Cataract    left  forming   . Depression    situational , resolved   . Headache(784.0)   . Osteoarthritis   . PONV (postoperative nausea and vomiting)   . Stroke [facility]) 03/2020    Past Surgical History:  Procedure Laterality Date  . BUBBLE STUDY  05/19/2020   Procedure: BUBBLE STUDY;  Surgeon: Sanda Klein, MD;  Location: [facility];  Service: Cardiovascular;;  . BUNIONECTOMY    . CESAREAN SECTION     x3  . CHOLECYSTECTOMY    . COLONOSCOPY     ~51 ish per pt  bucini  . FRACTURE SURGERY    . KNEE ARTHROSCOPY W/ MENISCAL REPAIR Right    x2  . ROTATOR CUFF REPAIR  2018   right   . TEE WITHOUT CARDIOVERSION N/A 05/19/2020   Procedure: TRANSESOPHAGEAL ECHOCARDIOGRAM (TEE);  Surgeon: Sanda Klein, MD;  Location: [facility];  Service: Cardiovascular;  Laterality: N/A;  . TOTAL HIP ARTHROPLASTY Left 08/03/2018   Procedure: TOTAL HIP ARTHROPLASTY ANTERIOR APPROACH;  Surgeon: Gaynelle Arabian, MD;  Location: [facility];  Service: Orthopedics;  Laterality: Left;  . TUBAL LIGATION    . tubal reversal      Current Medications: Current Meds  Medication Sig  . aspirin EC  81 MG tablet Take 81 mg by mouth in the morning. Swallow whole.  . clopidogrel (PLAVIX) 75 MG tablet Take 1 tablet (75 mg total) by mouth daily.  Marland Kitchen doxylamine, Sleep, (UNISOM) 25 MG tablet Take 25 mg by mouth at bedtime.  . rosuvastatin (CRESTOR) 20 MG tablet TAKE 1 TABLET BY MOUTH ONCE A DAY (Patient taking differently: Take 20 mg by mouth in the morning.)  . valACYclovir (VALTREX) 1000 MG tablet Take 1 g by mouth in the morning.     Allergies:   Dilaudid [hydromorphone] and Sulfa antibiotics   Social History   Socioeconomic History  . Marital status: Single    Spouse name: Not on file  . Number of children: Not on file  . Years of education: Not on file  . Highest education level: Not on file  Occupational History  . Occupation: Full time  Tobacco Use  . Smoking status: Former Research scientist (life sciences)  . Smokeless tobacco: Never Used   Substance and Sexual Activity  . Alcohol use: Yes    Alcohol/week: 1.0 - 2.0 standard drink    Types: 1 - 2 Glasses of wine per week    Comment: one to two glasses of wine per day   . Drug use: No  . Sexual activity: Yes    Birth control/protection: Post-menopausal  Other Topics Concern  . Not on file  Social History Narrative   Lives with significant other, Eulas Post.   Right Handed   Drinks 2-3 cups caffeine daily   Social Determinants of Health   Financial Resource Strain: Not on file  Food Insecurity: Not on file  Transportation Needs: Not on file  Physical Activity: Not on file  Stress: Not on file  Social Connections: Not on file     Family History: The patient's family history includes Alcohol abuse in her brother, father, mother, and sister; Arthritis in her father; Breast cancer in her maternal aunt and mother; Cancer in her brother and father; Colon cancer (age of onset: 33) in her sister; Depression in her brother and sister; Diabetes in her father; Drug abuse in her brother; Early death in her brother; Hearing loss in her brother and father. There is no history of Colon polyps, Esophageal cancer, Rectal cancer, or Stomach cancer.  ROS:   Please see the history of present illness.    All other systems reviewed and are negative.  EKGs/Labs/Other Studies Reviewed:    The following studies were reviewed today:  Echo 04/26/2020: IMPRESSIONS  1. Left ventricular ejection fraction, by estimation, is 60 to 65%. The  left ventricle has normal function. The left ventricle has no regional  wall motion abnormalities. Left ventricular diastolic parameters are  consistent with Grade I diastolic  dysfunction (impaired relaxation).  2. Right ventricular systolic function is normal. The right ventricular  size is normal.  3. The mitral valve is normal in structure. No evidence of mitral valve  regurgitation. No evidence of mitral stenosis.  4. The aortic valve is tricuspid.  Aortic valve regurgitation is not  visualized. No aortic stenosis is present.  5. The inferior vena cava is normal in size with greater than 50%  respiratory variability, suggesting right atrial pressure of 3 mmHg.  Transcranial Doppler 05/01/2020: Summary:  A vascular evaluation was performed. The right middle cerebral artery was  studied. An IV was inserted into the patient's left forearm. Verbal  informed consent was obtained.    A small to medium sized PFO is noted based on a partial curtain  effect in  the presence of the valsalva maneuver.  Angela Burke 4   MRI Brain IMPRESSION: Small late acute to subacute infarct of the right superior ventral midbrain.  Minor chronic microvascular ischemic changes.  MRA Brain: IMPRESSION: 1. Negative MRA of the head and neck. No large vessel occlusion or hemodynamically significant stenosis. 2. Mild distal small vessel atheromatous irregularity within the intracranial circulation. 3. Predominant fetal type origin of the PCAs with overall diminutive vertebrobasilar system.  _____________________  Cardiac monitor 3/5-46/22 Study Highlights  1. The basic rhythm is normal sinus with an average HR of 75 (range 55-137) bpm 2. No atrial fibrillation or flutter 3. No high-grade Sandra block or pathologic pauses 4. No significant arrhythmia identified  ____________________   TEE 06/23/20 IMPRESSIONS  1. Left ventricular ejection fraction, by estimation, is 60 to 65%. The  left ventricle has normal function. The left ventricle has no regional  wall motion abnormalities.  2. Right ventricular systolic function is normal. The right ventricular  size is normal.  3. No left atrial/left atrial appendage thrombus was detected.  4. The mitral valve is normal in structure. No evidence of mitral valve  regurgitation. No evidence of mitral stenosis.  5. The aortic valve is normal in structure. Aortic valve regurgitation is  not  visualized. No aortic stenosis is present.  6. The inferior vena cava is normal in size with greater than 50%  respiratory variability, suggesting right atrial pressure of 3 mmHg.  7. Evidence of atrial level shunting detected by color flow Doppler.  There is a small (3 mm defect) patent foramen ovale with bidirectional  shunting across atrial septum. The atrial septum is aneurysmal, with an  overall left to right excursion of 1.5  cm. There is prompt invagination of the fossa ovalis floor into the left  atrium with cough or straining, with immediate right to left shunt.   Conclusion(s)/Recommendation(s): Normal biventricular function without  evidence of hemodynamically significant valvular Sandra disease. 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 Consider device closure in a symptomatic patient.   EKG:  EKG is ordered today.  The ekg ordered today demonstrates sinus HR 71  Recent Labs: 09/09/2019: ALT 16; TSH 1.32 05/10/2020: BUN 17; Creatinine, Ser 0.84; Hemoglobin 14.0; Platelets 257; Potassium 4.3; Sodium 144  Recent Lipid Panel    Component Value Date/Time   CHOL 145 04/24/2020 1236   TRIG 220 (H) 04/24/2020 1236   HDL 68 04/24/2020 1236   CHOLHDL 2.1 04/24/2020 1236   CHOLHDL 3 09/09/2019 0834   VLDL 12.0 09/09/2019 0834   LDLCALC 43 04/24/2020 1236     Risk Assessment/Calculations:       Physical Exam:    VS:  BP 138/78   Pulse 71   Ht 5\' 6"  (1.676 m)   Wt 165 lb (74.8 kg)   SpO2 98%   BMI 26.63 kg/m     Wt Readings from Last 3 Encounters:  06/28/20 165 lb (74.8 kg)  05/19/20 164 lb (74.4 kg)  05/10/20 167 lb (75.8 kg)     GEN:  Well nourished, well developed in no acute distress. Looks younger than stated age 58: Normal NECK: No JVD; No carotid bruits LYMPHATICS: No lymphadenopathy CARDIAC: RRR, no murmurs, rubs, gallops RESPIRATORY:  Clear to auscultation without rales, wheezing or rhonchi  ABDOMEN: Soft, non-tender,  non-distended MUSCULOSKELETAL:  No edema; No deformity  SKIN: Warm and dry NEUROLOGIC:  Alert and oriented x 3 PSYCHIATRIC:  Normal affect   ASSESSMENT:  1. PFO (patent foramen ovale)   2. History of stroke   3. Pre-procedure lab exam    PLAN:    In order of problems listed above:  PFO with atrial septal aneurysm: she is being loaded with plavix. Continue on aspirin. Plan for PFO closure 06/30/20 with Dr. Burt Knack. She understands the need for SBE prophylaxis x 6 months after closure.   The patient is counseled about the association of PFO and cryptogenic stroke. Available clinical trial data is reviewed, specifically those trials comparing transcatheter PFO closure and medical therapy with antiplatelet drugs. The patient understands the potential benefit of PFO closure with respect to secondary stroke reduction compared with medical therapy alone. Specific risks of transcatheter PFO closure are reviewed with the patient. These risks include bleeding, infection, device embolization, stroke, cardiac perforation, tamponade, arrhythmia, MI, and late device erosion. She understands these serious risks occur at low incidence of < 1%.  The patient understands the need to take dual antiplatelet therapy with aspirin and clopidogrel together for a minimum period of 6 months following transcatheter PFO closure.  They understand the need to follow SBE prophylaxis per guidelines for a period of 6 months following PFO closure.  The patient provides full informed consent for the procedure.   Cryptogenic CVA: Continue statin and antiplatelets   Medication Adjustments/Labs and Tests Ordered: Current medicines are reviewed at length with the patient today.  Concerns regarding medicines are outlined above.  Orders Placed This Encounter  Procedures  . Basic metabolic panel  . CBC   No orders of the defined types were placed in this encounter.   Patient Instructions  Medication Instructions:  Your  physician recommends that you continue on your current medications as directed. Please refer to the Current Medication list given to you today.  *If you need a refill on your cardiac medications before your next appointment, please call your pharmacy*   Lab Work: Pre procedure labs today: BMET & CBC  If you have labs (blood work) drawn today and your tests are completely normal, you will receive your results only by: Marland Kitchen MyChart Message (if you have MyChart) OR . A paper copy in the mail If you have any lab test that is abnormal or we need to change your treatment, we will call you to review the results.   Testing/Procedures: None ordered   Follow-Up: At [facility], you and your health needs are our priority.  As part of our continuing mission to provide you with exceptional Sandra care, we have created designated Provider Care Teams.  These Care Teams include your primary Cardiologist (physician) and Advanced Practice Providers (APPs -  Physician Assistants and Nurse Practitioners) who all work together to provide you with the care you need, when you need it.  Your next appointment:    May 18th  The format for your next appointment:   In Person  Provider:   Nell Range, PA-C    Thank you for choosing [facility]!!   Trinidad Curet, RN [phone]   Other Instructions       Signed, Angelena Form, PA-C  06/28/2020 4:47 PM    [facility]

## 2020-06-28 ENCOUNTER — Encounter: Payer: Self-pay | Admitting: Physician Assistant

## 2020-06-28 ENCOUNTER — Other Ambulatory Visit (HOSPITAL_COMMUNITY)
Admission: RE | Admit: 2020-06-28 | Discharge: 2020-06-28 | Disposition: A | Discharge status: Home / Self Care | Payer: 59 | Source: Ambulatory Visit | Attending: Cardiovascular Disease | Admitting: Cardiovascular Disease

## 2020-06-28 ENCOUNTER — Other Ambulatory Visit: Payer: Self-pay

## 2020-06-28 ENCOUNTER — Ambulatory Visit: Payer: 59 | Admitting: Physician Assistant

## 2020-06-28 VITALS — BP 138/78 | HR 71 | Ht 66.0 in | Wt 165.0 lb

## 2020-06-28 DIAGNOSIS — Z01812 Encounter for preprocedural laboratory examination: Secondary | ICD-10-CM | POA: Diagnosis not present

## 2020-06-28 DIAGNOSIS — Q2112 Patent foramen ovale: Secondary | ICD-10-CM

## 2020-06-28 DIAGNOSIS — Q211 Atrial septal defect: Secondary | ICD-10-CM | POA: Diagnosis not present

## 2020-06-28 DIAGNOSIS — Z8673 Personal history of transient ischemic attack (TIA), and cerebral infarction without residual deficits: Secondary | ICD-10-CM | POA: Diagnosis not present

## 2020-06-28 DIAGNOSIS — Z20822 Contact with and (suspected) exposure to covid-19: Secondary | ICD-10-CM | POA: Insufficient documentation

## 2020-06-28 LAB — SARS CORONAVIRUS 2 (TAT 6-24 HRS): SARS Coronavirus 2: NEGATIVE

## 2020-06-28 NOTE — Patient Instructions (Addendum)
Medication Instructions:  Your physician recommends that you continue on your current medications as directed. Please refer to the Current Medication list given to you today.  *If you need a refill on your cardiac medications before your next appointment, please call your pharmacy*   Lab Work: Pre procedure labs today: BMET & CBC  If you have labs (blood work) drawn today and your tests are completely normal, you will receive your results only by: Marland Kitchen MyChart Message (if you have MyChart) OR . A paper copy in the mail If you have any lab test that is abnormal or we need to change your treatment, we will call you to review the results.   Testing/Procedures: None ordered   Follow-Up: At [facility], you and your health needs are our priority.  As part of our continuing mission to provide you with exceptional heart care, we have created designated Provider Care Teams.  These Care Teams include your primary Cardiologist (physician) and Advanced Practice Providers (APPs -  Physician Assistants and Nurse Practitioners) who all work together to provide you with the care you need, when you need it.  Your next appointment:    May 18th  The format for your next appointment:   In Person  Provider:   Nell Range, PA-C    Thank you for choosing [facility]!!   Trinidad Curet, RN [phone]   Other Instructions

## 2020-06-29 LAB — CBC
Hematocrit: 40.7 % (ref 34.0–46.6)
Hemoglobin: 13.9 g/dL (ref 11.1–15.9)
MCH: 32.9 pg (ref 26.6–33.0)
MCHC: 34.2 g/dL (ref 31.5–35.7)
MCV: 96 fL (ref 79–97)
Platelets: 248 10*3/uL (ref 150–450)
RBC: 4.23 x10E6/uL (ref 3.77–5.28)
RDW: 12.2 % (ref 11.7–15.4)
WBC: 5.3 10*3/uL (ref 3.4–10.8)

## 2020-06-29 LAB — BASIC METABOLIC PANEL
BUN/Creatinine Ratio: 20 (ref 12–28)
BUN: 17 mg/dL (ref 8–27)
CO2: 22 mmol/L (ref 20–29)
Calcium: 9.7 mg/dL (ref 8.7–10.3)
Chloride: 102 mmol/L (ref 96–106)
Creatinine, Ser: 0.85 mg/dL (ref 0.57–1.00)
Glucose: 89 mg/dL (ref 65–99)
Potassium: 4.2 mmol/L (ref 3.5–5.2)
Sodium: 140 mmol/L (ref 134–144)
eGFR: 75 mL/min/{1.73_m2} (ref >59)

## 2020-06-29 NOTE — Addendum Note (Signed)
Addended by: Stanton Kidney on: 06/29/2020 04:50 PM   Modules accepted: Orders

## 2020-06-30 ENCOUNTER — Ambulatory Visit (HOSPITAL_BASED_OUTPATIENT_CLINIC_OR_DEPARTMENT_OTHER): Payer: 59

## 2020-06-30 ENCOUNTER — Ambulatory Visit (HOSPITAL_COMMUNITY)
Admission: RE | Admit: 2020-06-30 | Discharge: 2020-06-30 | Disposition: A | Discharge status: Home / Self Care | Payer: 59 | Attending: Cardiovascular Disease | Admitting: Cardiovascular Disease

## 2020-06-30 ENCOUNTER — Encounter (HOSPITAL_COMMUNITY)
Admission: RE | Disposition: A | Discharge status: Home / Self Care | Payer: Self-pay | Source: Home / Self Care | Attending: Cardiovascular Disease

## 2020-06-30 ENCOUNTER — Other Ambulatory Visit: Payer: Self-pay

## 2020-06-30 DIAGNOSIS — Z8673 Personal history of transient ischemic attack (TIA), and cerebral infarction without residual deficits: Secondary | ICD-10-CM | POA: Diagnosis not present

## 2020-06-30 DIAGNOSIS — Z882 Allergy status to sulfonamides status: Secondary | ICD-10-CM | POA: Insufficient documentation

## 2020-06-30 DIAGNOSIS — Z7982 Long term (current) use of aspirin: Secondary | ICD-10-CM | POA: Insufficient documentation

## 2020-06-30 DIAGNOSIS — I253 Aneurysm of heart: Secondary | ICD-10-CM | POA: Insufficient documentation

## 2020-06-30 DIAGNOSIS — Q2112 Patent foramen ovale: Secondary | ICD-10-CM

## 2020-06-30 DIAGNOSIS — Z87891 Personal history of nicotine dependence: Secondary | ICD-10-CM | POA: Insufficient documentation

## 2020-06-30 DIAGNOSIS — Z79899 Other long term (current) drug therapy: Secondary | ICD-10-CM | POA: Diagnosis not present

## 2020-06-30 DIAGNOSIS — Q211 Atrial septal defect: Secondary | ICD-10-CM | POA: Diagnosis not present

## 2020-06-30 DIAGNOSIS — Z7902 Long term (current) use of antithrombotics/antiplatelets: Secondary | ICD-10-CM | POA: Diagnosis not present

## 2020-06-30 DIAGNOSIS — Z885 Allergy status to narcotic agent status: Secondary | ICD-10-CM | POA: Insufficient documentation

## 2020-06-30 HISTORY — PX: PATENT FORAMEN OVALE(PFO) CLOSURE: CATH118300

## 2020-06-30 LAB — POCT ACTIVATED CLOTTING TIME
Activated Clotting Time: 249 seconds
Activated Clotting Time: 249 seconds

## 2020-06-30 LAB — ECHOCARDIOGRAM LIMITED
Height: 66 in
Weight: 2496 oz

## 2020-06-30 SURGERY — PATENT FORAMEN OVALE (PFO) CLOSURE
Anesthesia: LOCAL

## 2020-06-30 MED ORDER — LABETALOL HCL 5 MG/ML IV SOLN: 10.0000 mg | INTRAVENOUS | Status: DC | PRN

## 2020-06-30 MED ORDER — DIAZEPAM 5 MG PO TABS: 5.0000 mg | ORAL_TABLET | Freq: Four times a day (QID) | ORAL | Status: DC | PRN

## 2020-06-30 MED ORDER — HEPARIN SODIUM (PORCINE) 1000 UNIT/ML IJ SOLN
INTRAMUSCULAR | Status: AC
  Filled 2020-06-30: qty 1

## 2020-06-30 MED ORDER — LIDOCAINE HCL (PF) 1 % IJ SOLN
INTRAMUSCULAR | Status: AC
  Filled 2020-06-30: qty 30

## 2020-06-30 MED ORDER — ACETAMINOPHEN 325 MG PO TABS: 650.0000 mg | ORAL_TABLET | ORAL | Status: DC | PRN

## 2020-06-30 MED ORDER — SODIUM CHLORIDE 0.9% FLUSH: 3.0000 mL | INTRAVENOUS | Status: DC | PRN

## 2020-06-30 MED ORDER — FENTANYL CITRATE (PF) 100 MCG/2ML IJ SOLN
INTRAMUSCULAR | Status: AC
  Filled 2020-06-30: qty 2

## 2020-06-30 MED ORDER — SODIUM CHLORIDE 0.9 % WEIGHT BASED INFUSION: 1.0000 mL/kg/h | INTRAVENOUS | Status: DC

## 2020-06-30 MED ORDER — HEPARIN (PORCINE) IN NACL 1000-0.9 UT/500ML-% IV SOLN
INTRAVENOUS | Status: AC
  Filled 2020-06-30: qty 500

## 2020-06-30 MED ORDER — CEFAZOLIN SODIUM-DEXTROSE 2-4 GM/100ML-% IV SOLN: 2.0000 g | INTRAVENOUS | Status: DC

## 2020-06-30 MED ORDER — SODIUM CHLORIDE 0.9 % IV SOLN: 250.0000 mL | INTRAVENOUS | Status: DC | PRN

## 2020-06-30 MED ORDER — HEPARIN (PORCINE) IN NACL 1000-0.9 UT/500ML-% IV SOLN
INTRAVENOUS | Status: DC | PRN
  Administered 2020-06-30 (×4): 500 mL

## 2020-06-30 MED ORDER — MIDAZOLAM HCL 2 MG/2ML IJ SOLN
INTRAMUSCULAR | Status: AC
  Filled 2020-06-30: qty 2

## 2020-06-30 MED ORDER — ONDANSETRON HCL 4 MG/2ML IJ SOLN
4.0000 mg | Freq: Four times a day (QID) | INTRAMUSCULAR | Status: DC | PRN
Start: 2020-06-30 — End: 2020-06-30

## 2020-06-30 MED ORDER — LIDOCAINE HCL (PF) 1 % IJ SOLN
INTRAMUSCULAR | Status: DC | PRN
  Administered 2020-06-30: 15 mL

## 2020-06-30 MED ORDER — SODIUM CHLORIDE 0.9% FLUSH: 3.0000 mL | Freq: Two times a day (BID) | INTRAVENOUS | Status: DC

## 2020-06-30 MED ORDER — ASPIRIN 81 MG PO CHEW: 81.0000 mg | CHEWABLE_TABLET | ORAL | Status: DC

## 2020-06-30 MED ORDER — FENTANYL CITRATE (PF) 100 MCG/2ML IJ SOLN
INTRAMUSCULAR | Status: DC | PRN
  Administered 2020-06-30 (×2): 25 ug via INTRAVENOUS

## 2020-06-30 MED ORDER — MIDAZOLAM HCL 2 MG/2ML IJ SOLN
INTRAMUSCULAR | Status: DC | PRN
  Administered 2020-06-30: 1 mg via INTRAVENOUS
  Administered 2020-06-30: 2 mg via INTRAVENOUS

## 2020-06-30 MED ORDER — HEPARIN SODIUM (PORCINE) 1000 UNIT/ML IJ SOLN
INTRAMUSCULAR | Status: DC | PRN
  Administered 2020-06-30: 5000 [IU] via INTRAVENOUS

## 2020-06-30 MED ORDER — HYDRALAZINE HCL 20 MG/ML IJ SOLN: 10.0000 mg | INTRAMUSCULAR | Status: DC | PRN

## 2020-06-30 MED ORDER — CLOPIDOGREL BISULFATE 75 MG PO TABS: 75.0000 mg | ORAL_TABLET | ORAL | Status: DC

## 2020-06-30 MED ORDER — SODIUM CHLORIDE 0.9 % WEIGHT BASED INFUSION: 3.0000 mL/kg/h | INTRAVENOUS | Status: AC

## 2020-06-30 SURGICAL SUPPLY — 18 items
CATH ACUNAV REPROCESSED (CATHETERS) ×1 IMPLANT
CATH INFINITI 6F MPA2 100CM (CATHETERS) ×1 IMPLANT
CLOSURE PERCLOSE PROSTYLE (VASCULAR PRODUCTS) ×1 IMPLANT
COVER SWIFTLINK CONNECTOR (BAG) ×1 IMPLANT
GUIDEWIRE AMPLATZER 1.5JX260 (WIRE) ×1 IMPLANT
HEMOSTAT KELLY 5.5 SS STRL (MISCELLANEOUS) ×1 IMPLANT
OCCLUDER PFO TALISMAN 18-18 (Prosthesis & Implant Heart) IMPLANT
PACK CARDIAC CATHETERIZATION (CUSTOM PROCEDURE TRAY) ×2 IMPLANT
PROTECTION STATION PRESSURIZED (MISCELLANEOUS) ×2
SHEATH DELIVERY TALISMAN 8F 80 (SHEATH) IMPLANT
SHEATH INTROD W/O MIN 9FR 25CM (SHEATH) ×1 IMPLANT
SHEATH PINNACLE 8F 10CM (SHEATH) ×1 IMPLANT
SHEATH PROBE COVER 6X72 (BAG) ×1 IMPLANT
STATION PROTECTION PRESSURIZED (MISCELLANEOUS) IMPLANT
TALISMAN DELIVERY SHEATH 8F 80 (SHEATH) ×2
TALISMAN PFO OCCLUDER 18-18 (Prosthesis & Implant Heart) ×2 IMPLANT
TRANSDUCER W/STOPCOCK (MISCELLANEOUS) ×2 IMPLANT
WIRE EMERALD 3MM-J .035X150CM (WIRE) ×1 IMPLANT

## 2020-06-30 NOTE — Progress Notes (Signed)
  Echocardiogram 2D Echocardiogram limited with color has been performed.  Darlina Sicilian M 06/30/2020, 9:27 AM

## 2020-06-30 NOTE — Interval H&P Note (Signed)
History and Physical Interval Note:  06/30/2020 7:25 AM  Sandra Clay  has presented today for surgery, with the diagnosis of PFO.  The various methods of treatment have been discussed with the patient and family. After consideration of risks, benefits and other options for treatment, the patient has consented to  Procedure(s): PATENT FORAMEN OVALE (PFO) CLOSURE (N/A) as a surgical intervention.  The patient's history has been reviewed, patient examined, no change in status, stable for surgery.  I have reviewed the patient's chart and labs.  Questions were answered to the patient's satisfaction.     Sherren Mocha

## 2020-06-30 NOTE — Discharge Instructions (Signed)
Transcatheter Closure of Patent Foramen Ovale, Adult, Care After The following information offers guidance on how to care for yourself after your procedure. Your health care provider may also give you more specific instructions. If you have problems or questions, contact your health care provider. What can I expect after the procedure? After the procedure, it is common to have:  Swelling and tenderness in the area where the catheter was inserted.  Bruising. Follow these instructions at home: Medicines  Take over-the-counter and prescription medicines only as told by your health care provider.  You may need to take medicines to prevent blood clots for 6 months or longer. You may have to take aspirin and clopidogrel. Clopidogrel is a blood thinner (anticoagulant) that helps to prevent heart attacks and strokes.  Ask your health care provider if you need to take antibiotic medicine before dental procedures and surgeries. This may be necessary to prevent infection. Bleeding precautions If you are taking blood thinners:  Talk with your health care provider before you take any medicines that contain aspirin or NSAIDs, such as ibuprofen. These medicines increase your risk for dangerous bleeding.  Take your medicine exactly as told, at the same time every day.  Wear a medical alert bracelet or carry a card that lists what medicines you take.  Avoid activities that could cause injury or bruising: ? Prevent falls by removing loose rugs and extension cords from areas where you walk. ? Be very careful when using knives, scissors, or other sharp objects. ? Do not play contact sports or participate in other activities that have a high risk of injury. Care of the catheter insertion site  Follow instructions from your health care provider about how to take care of your catheter insertion site. Make sure you: ? Wash your hands with soap and water for at least 20 seconds before and after you change  your bandage (dressing). If soap and water are not available, use hand sanitizer. ? Change your dressing as told by your health care provider. ? Leave stitches (sutures), skin glue, or adhesive strips in place. These skin closures may need to stay in place for 2 weeks or longer. If adhesive strip edges start to loosen and curl up, you may trim the loose edges. Do not remove adhesive strips completely unless your health care provider tells you to do that. Do not peel away any skin glue. This will fall off on its own.  Check your catheter insertion site every day for signs of infection. Check for: ? More redness, swelling, or pain. ? Fluid or blood. ? Warmth. ? A lump or bump. ? Pus or a bad smell.   Activity  Do not lift anything that is heavier than 10 lb (4.5 kg), or the limit that you are told, until your health care provider says that it is safe.  If you were given a sedative during the procedure, it can affect you for several hours. Do not drive or operate machinery until your health care provider says that it is safe.  Do not take baths, swim, or use a hot tub until your health care provider approves. Ask your health care provider if you may take showers. You may only be allowed to take sponge baths.  Return to your normal activities as told by your health care provider. Ask your health care provider what activities are safe for you. Lifestyle  Avoid drinking alcohol.  Do not use any products that contain nicotine or tobacco. These products include cigarettes,  chewing tobacco, and vaping devices, such as e-cigarettes. If you need help quitting, ask your health care provider. General instructions  Drink enough fluid to keep your urine pale yellow. This helps to get rid of the dye that was used during the procedure.  Tell all health care providers and dental care providers who care for you that you had a patent foramen ovale closure. Do this before having any type of test or  surgery.  Wear compression stockings as told by your health care provider. These stockings help to prevent blood clots and reduce swelling in your legs.  Keep all follow-up visits. This is important. Contact a health care provider if:  You have pain that does not get better with medicine.  You have pain or swelling in your leg.  You have a fever.  You have signs of infection in the area where the catheter was inserted. These include: ? More redness, swelling, or pain around the insertion site. ? Fluid or blood coming from the insertion site. ? Warmth when you touch the insertion site. ? A hard lump or bump at the insertion site. ? Pus or a bad smell coming from the insertion site. Get help right away if:  You have trouble breathing.  You have chest pain.  You have severe pain in your arm or jaw. These symptoms may represent a serious problem that is an emergency. Do not wait to see if the symptoms will go away. Get medical help right away. Call your local emergency services (911 in the [place] Do not drive yourself to the hospital. Summary  After the procedure, it is common to have some bruising, swelling, and tenderness where the catheter was inserted into your inner thigh or groin area.  Check your catheter insertion site every day for signs of infection, such as more redness, swelling, or pain.  Before any procedure or test, tell all health care providers and dental providers who care for you that you had a patent foramen ovale closure. This information is not intended to replace advice given to you by your health care provider. Make sure you discuss any questions you have with your health care provider. Document Revised: 01/06/2020 Document Reviewed: 01/06/2020 Elsevier Patient Education  2021 [employer].

## 2020-07-03 ENCOUNTER — Encounter (HOSPITAL_COMMUNITY): Payer: Self-pay | Admitting: Cardiovascular Disease

## 2020-07-06 ENCOUNTER — Ambulatory Visit
Admission: RE | Admit: 2020-07-06 | Discharge: 2020-07-06 | Disposition: A | Discharge status: Home / Self Care | Payer: 59 | Source: Ambulatory Visit | Attending: Obstetrics & Gynecology | Admitting: Obstetrics & Gynecology

## 2020-07-06 ENCOUNTER — Other Ambulatory Visit: Payer: Self-pay

## 2020-07-06 DIAGNOSIS — Z1231 Encounter for screening mammogram for malignant neoplasm of breast: Secondary | ICD-10-CM

## 2020-07-10 ENCOUNTER — Other Ambulatory Visit: Payer: Self-pay | Admitting: Obstetrics & Gynecology

## 2020-07-10 DIAGNOSIS — R928 Other abnormal and inconclusive findings on diagnostic imaging of breast: Secondary | ICD-10-CM

## 2020-07-11 ENCOUNTER — Other Ambulatory Visit (HOSPITAL_COMMUNITY): Payer: Self-pay | Admitting: Obstetrics & Gynecology

## 2020-07-11 ENCOUNTER — Ambulatory Visit (HOSPITAL_COMMUNITY)
Admission: RE | Admit: 2020-07-11 | Discharge: 2020-07-11 | Disposition: A | Discharge status: Home / Self Care | Payer: 59 | Source: Ambulatory Visit | Attending: Obstetrics & Gynecology | Admitting: Obstetrics & Gynecology

## 2020-07-11 ENCOUNTER — Other Ambulatory Visit: Payer: Self-pay

## 2020-07-11 DIAGNOSIS — R928 Other abnormal and inconclusive findings on diagnostic imaging of breast: Secondary | ICD-10-CM

## 2020-07-11 DIAGNOSIS — R922 Inconclusive mammogram: Secondary | ICD-10-CM | POA: Diagnosis not present

## 2020-07-11 DIAGNOSIS — N6489 Other specified disorders of breast: Secondary | ICD-10-CM | POA: Diagnosis not present

## 2020-07-11 DIAGNOSIS — Z803 Family history of malignant neoplasm of breast: Secondary | ICD-10-CM | POA: Diagnosis not present

## 2020-07-17 ENCOUNTER — Telehealth: Payer: Self-pay | Admitting: Neurology

## 2020-07-17 NOTE — Telephone Encounter (Signed)
LVM and sent MyChart message asking patient to call us back to r/s her appt with Dr. Leonie Man on June 2 due to him being out of the office.

## 2020-07-19 NOTE — Telephone Encounter (Signed)
Patient is requesting to be seen sooner rather than waiting 6 months from her last appointment.  Danae Chen can you work this patient in or put on the cancellation list so we can see her sooner.

## 2020-07-20 NOTE — Telephone Encounter (Signed)
Returned patient call and she accepted appointment with Jessica @ 08/29/20 @ 0915 with arrival time of 0845.  Patient denied further questions, verbalized understanding and expressed appreciation for the phone call.

## 2020-07-21 ENCOUNTER — Other Ambulatory Visit (HOSPITAL_COMMUNITY): Payer: Self-pay

## 2020-07-21 MED FILL — Valacyclovir HCl Tab 1 GM: ORAL | 90 days supply | Qty: 90 | Fill #0 | Status: AC

## 2020-07-22 ENCOUNTER — Other Ambulatory Visit: Payer: Self-pay

## 2020-07-22 ENCOUNTER — Other Ambulatory Visit (HOSPITAL_COMMUNITY): Payer: Self-pay

## 2020-07-22 MED FILL — Rosuvastatin Calcium Tab 20 MG: ORAL | 90 days supply | Qty: 90 | Fill #0 | Status: CN

## 2020-07-26 ENCOUNTER — Other Ambulatory Visit: Payer: 59

## 2020-07-31 ENCOUNTER — Other Ambulatory Visit (HOSPITAL_COMMUNITY): Payer: Self-pay

## 2020-07-31 NOTE — Progress Notes (Signed)
[place]                                     Cardiology Office Note:    Date:  08/02/2020   ID:  Sandra Clay, DOB October 02, 1955, MRN [id number]  PCP:  Vivi Barrack, MD  [facility] Cardiologist:  None  [facility] Electrophysiologist:  None   Referring MD: Vivi Barrack, MD   CC: 1 mo s/p PFO closure   History of Present Illness:    Sandra Clay is a 65 y.o. female with a hx of cryptogenic stroke and PFO s/p PFO closure (06/30/20) who presents to clinic for follow up.  The patient had been previously healthy.  She developed sudden onset of double vision in January 2022. This was associated with poor balance and a feeling that she was leaning to the left.  There was associated nausea but no vomiting.  An outpatient evaluation was undertaken.  A CT scan of the head showed no significant abnormality.  MRI scan demonstrated a small right superior ventral midbrain infarct.  MRA of the brain subsequently showed no significant large vessel atherosclerotic disease.  A 2D echocardiogram showed no abnormalities.  Lower extremity venous duplex was negative for DVT. Transcranial Doppler ultrasound study showed Spencer grade 4 right to left shunt consistent with PFO. Subsequent 30 day cardiac monitor was negative for afib. TEE on 05/19/20 showed an anatomically higher risk PFO with immediate R-->L shunt and atrial septal aneurysm.   She was evaluated by Dr. Burt Knack and felt to be a good candidate for PFO closure. She underwent successful transcatheter PFO closure using an 18 mm Amplatzer PFO occluder device on 06/30/20. Post op echo showed EF 60-65% with normal PFO occluder placement with no significant shunting by color flow doppler. She was discharged on aspirin and plavix x 6 months.   Today she presents to clinic for for follow up. No CP or SOB. No LE edema, orthopnea or PND. No dizziness or syncope. No blood in stool or urine. No palpitations.  Exercising and eating healthy and "feeling fabulous."   Past Medical History:  Diagnosis Date  . Anxiety   . Cancer ([place])    skin cancer   . Cataract    left forming   . Depression    situational , resolved   . Headache(784.0)   . Osteoarthritis   . PONV (postoperative nausea and vomiting)   . Stroke [facility]) 03/2020    Past Surgical History:  Procedure Laterality Date  . BUBBLE STUDY  05/19/2020   Procedure: BUBBLE STUDY;  Surgeon: Sanda Klein, MD;  Location: [facility];  Service: Cardiovascular;;  . BUNIONECTOMY    . CESAREAN SECTION     x3  . CHOLECYSTECTOMY    . COLONOSCOPY     ~51 ish per pt  bucini  . FRACTURE SURGERY    . KNEE ARTHROSCOPY W/ MENISCAL REPAIR Right    x2  . PATENT FORAMEN OVALE(PFO) CLOSURE N/A 06/30/2020   Procedure: PATENT FORAMEN OVALE (PFO) CLOSURE;  Surgeon: Sherren Mocha, MD;  Location: [place] CV LAB;  Service: Cardiovascular;  Laterality: N/A;  . ROTATOR CUFF REPAIR  2018   right   . TEE WITHOUT CARDIOVERSION N/A 05/19/2020   Procedure: TRANSESOPHAGEAL ECHOCARDIOGRAM (TEE);  Surgeon: Sanda Klein, MD;  Location: [place];  Service: Cardiovascular;  Laterality: N/A;  .  TOTAL HIP ARTHROPLASTY Left 08/03/2018   Procedure: TOTAL HIP ARTHROPLASTY ANTERIOR APPROACH;  Surgeon: Gaynelle Arabian, MD;  Location: [facility];  Service: Orthopedics;  Laterality: Left;  . TUBAL LIGATION    . tubal reversal      Current Medications: Current Meds  Medication Sig  . aspirin EC 81 MG tablet Take 81 mg by mouth in the morning. Swallow whole.  . clopidogrel (PLAVIX) 75 MG tablet Take 1 tablet (75 mg total) by mouth daily.  Marland Kitchen doxylamine, Sleep, (UNISOM) 25 MG tablet Take 25 mg by mouth at bedtime.  . rosuvastatin (CRESTOR) 20 MG tablet TAKE 1 TABLET BY MOUTH ONCE A DAY (Patient taking differently: Take 20 mg by mouth in the morning.)  . valACYclovir (VALTREX) 1000 MG tablet Take 1 g by mouth in the morning.  . valACYclovir (VALTREX) 1000 MG tablet TAKE  1 TABLET BY MOUTH ONCE DAILY     Allergies:   Dilaudid [hydromorphone] and Sulfa antibiotics   Social History   Socioeconomic History  . Marital status: Single    Spouse name: Not on file  . Number of children: Not on file  . Years of education: Not on file  . Highest education level: Not on file  Occupational History  . Occupation: Full time  Tobacco Use  . Smoking status: Former Research scientist (life sciences)  . Smokeless tobacco: Never Used  Substance and Sexual Activity  . Alcohol use: Yes    Alcohol/week: 1.0 - 2.0 standard drink    Types: 1 - 2 Glasses of wine per week    Comment: one to two glasses of wine per day   . Drug use: No  . Sexual activity: Yes    Birth control/protection: Post-menopausal  Other Topics Concern  . Not on file  Social History Narrative   Lives with significant other, Eulas Post.   Right Handed   Drinks 2-3 cups caffeine daily   Social Determinants of Health   Financial Resource Strain: Not on file  Food Insecurity: Not on file  Transportation Needs: Not on file  Physical Activity: Not on file  Stress: Not on file  Social Connections: Not on file     Family History: The patient's family history includes Alcohol abuse in her brother, father, mother, and sister; Arthritis in her father; Breast cancer in her maternal aunt and mother; Cancer in her brother and father; Colon cancer (age of onset: 24) in her sister; Depression in her brother and sister; Diabetes in her father; Drug abuse in her brother; Early death in her brother; Hearing loss in her brother and father. There is no history of Colon polyps, Esophageal cancer, Rectal cancer, or Stomach cancer.  ROS:   Please see the history of present illness.    All other systems reviewed and are negative.  EKGs/Labs/Other Studies Reviewed:    The following studies were reviewed today:  06/30/2020 PATENT FORAMEN OVALE (PFO) CLOSURE  Conclusion Successful transcatheter PFO closure using an 18 mm Amplatzer PFO  occluder device  Recommend:  Same day DC protocol  ASA/clopidogrel x 6 months if tolerated  SBE prophylaxis x 6 months   _____________________   Echo limited 06/30/2020 IMPRESSIONS  1. Left ventricular ejection fraction, by estimation, is 60 to 65%. The  left ventricle has normal function.  2. The inferior vena cava is normal in size with greater than 50%  respiratory variability, suggesting right atrial pressure of 3 mmHg.  3. S/P PFO occluder placement. No significant shunting seen by Color  doppler.  EKG:  EKG is NOT ordered today.    Recent Labs: 09/09/2019: ALT 16; TSH 1.32 06/28/2020: BUN 17; Creatinine, Ser 0.85; Hemoglobin 13.9; Platelets 248; Potassium 4.2; Sodium 140  Recent Lipid Panel    Component Value Date/Time   CHOL 145 04/24/2020 1236   TRIG 220 (H) 04/24/2020 1236   HDL 68 04/24/2020 1236   CHOLHDL 2.1 04/24/2020 1236   CHOLHDL 3 09/09/2019 0834   VLDL 12.0 09/09/2019 0834   LDLCALC 43 04/24/2020 1236     Risk Assessment/Calculations:       Physical Exam:    VS:  BP 126/70   Pulse 76   Ht 5\' 6"  (1.676 m)   Wt 161 lb 9.6 oz (73.3 kg)   SpO2 97%   BMI 26.08 kg/m     Wt Readings from Last 3 Encounters:  08/02/20 161 lb 9.6 oz (73.3 kg)  06/30/20 156 lb (70.8 kg)  06/28/20 165 lb (74.8 kg)     GEN:  Well nourished, well developed in no acute distress. Looks younger than stated age 49: Normal NECK: No JVD; No carotid bruits LYMPHATICS: No lymphadenopathy CARDIAC: RRR, no murmurs, rubs, gallops RESPIRATORY:  Clear to auscultation without rales, wheezing or rhonchi  ABDOMEN: Soft, non-tender, non-distended MUSCULOSKELETAL:  No edema; No deformity  SKIN: Warm and dry NEUROLOGIC:  Alert and oriented x 3 PSYCHIATRIC:  Normal affect   ASSESSMENT:    1. S/P percutaneous patent foramen ovale closure   2. History of stroke   3. Hyperlipidemia, unspecified hyperlipidemia type    PLAN:    In order of problems listed  above:  PFO with atrial septal aneurysm s/p PFO closure: doing well. Groin site healed. Continue on ASA & Plavix x 6 months and aspirin indefinitely thereafter. She understands the need for SBE prophylaxis x 6 months. Plans to defer dental work for now. I will see her back in 1 year for echo with bubble.   Cryptogenic CVA: Continue statin and antiplatelets  HLD: LDL 106 before started on a statin . After stroke started on Crestor and now LDL in 40s. Wonders if she needs to be on it. We discussed that we believe she had a non traditional CVA but being on a statin certainly will not hurt. She has decided to continue it for now.    Medication Adjustments/Labs and Tests Ordered: Current medicines are reviewed at length with the patient today.  Concerns regarding medicines are outlined above.  No orders of the defined types were placed in this encounter.  No orders of the defined types were placed in this encounter.   Patient Instructions  Medication Instructions:  1) STOP PLAVIX when your pills run out (around 12/28/2020) *If you need a refill on your cardiac medications before your next appointment, please call your pharmacy*  Follow-Up: We will call you to scheduled your 1 year echo and office visit.    Signed, Angelena Form, PA-C  08/02/2020 2:13 PM    [facility]

## 2020-08-02 ENCOUNTER — Other Ambulatory Visit: Payer: Self-pay

## 2020-08-02 ENCOUNTER — Encounter: Payer: Self-pay | Admitting: Physician Assistant

## 2020-08-02 ENCOUNTER — Ambulatory Visit: Payer: 59 | Admitting: Physician Assistant

## 2020-08-02 VITALS — BP 126/70 | HR 76 | Ht 66.0 in | Wt 161.6 lb

## 2020-08-02 DIAGNOSIS — E785 Hyperlipidemia, unspecified: Secondary | ICD-10-CM | POA: Diagnosis not present

## 2020-08-02 DIAGNOSIS — Z8673 Personal history of transient ischemic attack (TIA), and cerebral infarction without residual deficits: Secondary | ICD-10-CM | POA: Diagnosis not present

## 2020-08-02 DIAGNOSIS — Z8774 Personal history of (corrected) congenital malformations of heart and circulatory system: Secondary | ICD-10-CM

## 2020-08-02 NOTE — Patient Instructions (Signed)
Medication Instructions:  1) STOP PLAVIX when your pills run out (around 12/28/2020) *If you need a refill on your cardiac medications before your next appointment, please call your pharmacy*  Follow-Up: We will call you to scheduled your 1 year echo and office visit.

## 2020-08-16 ENCOUNTER — Ambulatory Visit: Payer: 59 | Admitting: Neurology

## 2020-08-28 ENCOUNTER — Other Ambulatory Visit (HOSPITAL_COMMUNITY): Payer: Self-pay

## 2020-08-28 ENCOUNTER — Telehealth: Payer: Self-pay | Admitting: Adult Health

## 2020-08-28 MED FILL — Rosuvastatin Calcium Tab 20 MG: ORAL | 90 days supply | Qty: 90 | Fill #0 | Status: AC

## 2020-08-28 NOTE — Telephone Encounter (Signed)
Moved pt from [place] to dr. Clydene Fake schedule due to np being out.

## 2020-08-29 ENCOUNTER — Encounter: Payer: Self-pay | Admitting: Neurology

## 2020-08-29 ENCOUNTER — Ambulatory Visit (INDEPENDENT_AMBULATORY_CARE_PROVIDER_SITE_OTHER): Payer: 59 | Admitting: Neurology

## 2020-08-29 ENCOUNTER — Ambulatory Visit: Payer: Self-pay | Admitting: Adult Health

## 2020-08-29 VITALS — BP 140/78 | HR 81 | Ht 66.0 in | Wt 163.4 lb

## 2020-08-29 DIAGNOSIS — Q211 Atrial septal defect: Secondary | ICD-10-CM

## 2020-08-29 DIAGNOSIS — I253 Aneurysm of heart: Secondary | ICD-10-CM

## 2020-08-29 DIAGNOSIS — I639 Cerebral infarction, unspecified: Secondary | ICD-10-CM

## 2020-08-29 NOTE — Progress Notes (Signed)
[facility] [address] [place]. [place] [address] [phone]       OFFICE FOLLOW UP VISIT NOTE  Sandra Clay Date of Birth:  [birth year] Medical Record Number:  [id number]   Referring MD: Garret Reddish  Reason for Referral: Stroke  HPI: Initial consult 05/04/2020 Sandra Clay is a 65 year old pleasant Caucasian lady seen today for initial office consultation visit for stroke.  History is obtained on the patient, review of electronic medical records and I personally reviewed imaging films in PACS.  Sandra Clay is a pleasant middle-aged Caucasian lady who 2 weeks ago developed sudden onset of vertical double vision.  She states this was binocular.  She felt off balance as if she had had multiple drinks and was leaning to the left.  She complained of nausea but had no vomiting.  She try to walk her dog and had great difficulty doing so because of her double vision and imbalance.  She had recent eye surgery and thought this was related to that and she called her eye doctor who saw her the next day and did the present test and noted that the right eye was slightly hypertrophic.  She had a CT scan of the head on 04/10/2020 which was unremarkable.  Her symptoms improved the next day.  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  She subsequently had outpatient MRI of the neck and brain done on 04/18/2020 both of which did not show any large vessel stenosis or occlusion.  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  She has a 2D echo ordered which is pending for tomorrow.  She is currently doing outpatient occupational therapy but has not gotten any physical therapy but does admit that her balance is off and she has problems with the coordination of left leg.  She was started on aspirin and Plavix and Crestor which she is  tolerating well but does get a lot of bruising on Plavix.  Patient informs me that a week before her onset of her stroke symptoms she had undergone lower extremity vein sclerotherapy procedure.  She denies any prior history of DVT or known history of patent foramen ovale.  There is no prior history of palpitations, syncope, cardiac problems.  There is no family Struve strokes.  Patient does have previous migraine headaches but no seizures or other neurological problems.  She has no significant vascular risk factors except mild hyperlipidemia and LDL cholesterol in June 2021 was 106 mg percent Update 08/29/2020: She returns for follow-up after last visit 4 months ago.  Patient states she is done well she had no recurrent stroke or TIA symptoms.  She had a TCD bubble study on 05/01/2020 which showed a small to medium size PFO.  This was subsequently confirmed on the TEE and found to have atrial septal aneurysm and high risk characteristics.  She saw Dr. Burt Knack who did endovascular PFO closure successfully on 06/30/2020 and procedure went well without any complications.  She had lower extremity venous Doppler on 05/01/2020 which was negative for DVT.  She had lab work at last visit on 04/24/2020 which showed normal hemoglobin A1c, HIV test, RPR and ANA.  Lipid profile showed LDL cholesterol 43 mg percent with triglycerides were slightly elevated at 220 mg percent.  Patient has since been on low-fat presented calorie diet and has been losing weight.  She is also been excising daily.  She plans to have a follow-up lipid profile checked under fasting conditions and if her triglycerides are still elevated she may and another medication to her Crestor 20 mg daily which is tolerating well without any side effects.  Blood pressure well controlled today it is borderline in office at 140/78.  She has no new complaints. ROS:   14 system review of systems is positive for bruising, d , imbalance, incoordination all other systems  negative PMH:  Past Medical History:  Diagnosis Date   Anxiety    Cancer ([place])    skin cancer    Cataract    left forming    Depression    situational , resolved    Headache(784.0)    Osteoarthritis    PONV (postoperative nausea and vomiting)    Stroke ([place]) 03/2020    Social History:  Social History   Socioeconomic History   Marital status: Single    Spouse name: Not on file   Number of children: Not on file   Years of education: Not on file   Highest education level: Not on file  Occupational History   Occupation: Full time  Tobacco Use   Smoking status: Former    Pack years: 0.00   Smokeless tobacco: Never  Substance and Sexual Activity   Alcohol use: Yes    Alcohol/week: 1.0 - 2.0 standard drink    Types: 1 - 2 Glasses of wine per week    Comment: one to two glasses of wine per day    Drug use: No   Sexual activity: Yes    Birth control/protection: Post-menopausal  Other Topics Concern   Not on file  Social History Narrative   Lives with significant other, Carter.   Right Handed   Drinks 2-3 cups caffeine daily   Social Determinants of Health   Financial Resource Strain: Not on file  Food Insecurity: Not on file  Transportation Needs: Not on file  Physical Activity: Not on file  Stress: Not on file  Social Connections: Not on file  Intimate Partner Violence: Not on file    Medications:   Current Outpatient Medications on File Prior to Visit  Medication Sig Dispense Refill   aspirin EC 81 MG tablet Take 81 mg by mouth in the morning. Swallow whole.     clopidogrel (PLAVIX) 75 MG tablet Take 1 tablet (75 mg total) by mouth daily. 90 tablet 1   doxylamine, Sleep, (UNISOM) 25 MG tablet Take 25 mg by mouth at bedtime.     rosuvastatin (CRESTOR) 20 MG tablet TAKE 1 TABLET BY MOUTH ONCE A DAY (Patient taking differently: Take 20 mg by mouth in the morning.) 90 tablet 3   valACYclovir (VALTREX) 1000 MG tablet Take 1 g by mouth in the morning.  12    valACYclovir (VALTREX) 1000 MG tablet TAKE 1 TABLET BY MOUTH ONCE DAILY 90 tablet 99   No current facility-administered medications on file prior to visit.    Allergies:   Allergies  Allergen Reactions   Dilaudid [Hydromorphone] Itching   Sulfa Antibiotics Rash    Physical Exam General: well developed, well nourished middle-aged Caucasian lady, seated, in no evident distress Head: head normocephalic and atraumatic.   Neck: supple with no carotid or supraclavicular bruits Cardiovascular: regular rate and rhythm, no murmurs Musculoskeletal: no deformity Skin:  no rash but bruising noted in the left forearm Vascular:  Normal pulses all extremities  Neurologic Exam Mental Status: Awake and  fully alert. Oriented to place and time. Recent and remote memory intact. Attention span, concentration and fund of knowledge appropriate. Mood and affect appropriate.  Cranial Nerves: Fundoscopic exam not done. Pupils equal, briskly reactive to light. Extraocular movements full without nystagmus. Visual fields full to confrontation. Hearing intact. Facial sensation intact. Face, tongue, palate moves normally and symmetrically.  Motor: Normal bulk and tone. Normal strength in all tested extremity muscles mild diminished fine finger movements on the left.  Orbits right over left upper extremity.  Mild left grip weakness.. Sensory.: intact to touch , pinprick , position and vibratory sensation.  Coordination: Rapid alternating movements normal in all extremities. Finger-to-nose and heel-to-shin performed accurately bilaterally. Gait and Station: Arises from chair without difficulty. Stance is normal. Gait demonstrates normal stride length and balance . Able to heel, toe and tandem walk with moderate difficulty.  Reflexes: 1+ and symmetric. Toes downgoing.      ASSESSMENT: 65 year old Caucasian lady with transient diplopia and gait ataxia secondary to right midbrain infarct in January 2022 likely from  small vessel disease.  Vascular risk factors of mild hyperlipidemia , PFO and age only.     PLAN: I had a long d/w patient about her recent stroke, PFO endovascular closure,risk for recurrent stroke/TIAs, personally independently reviewed imaging studies and stroke evaluation results and answered questions.Continue aspirin 81 mg daily and clopidogrel 75 mg daily  for 6 months post PFO closure and then aspirin alone for secondary stroke prevention and maintain strict control of hypertension with blood pressure goal below 130/90, diabetes with hemoglobin A1c goal below 6.5% and lipids with LDL cholesterol goal below 70 mg/dL. I also advised the patient to eat a healthy diet with plenty of whole grains, cereals, fruits and vegetables, exercise regularly and maintain ideal body weight . She was asked to repeat fasting triglyceride level at next visit with her PCI in [place] in the future with me in 6 months or call earlier if necessary. Greater than 50% time during this 25-minute  visit was spent on counseling and coordination of care about her brainstem infarct and diplopia and PFO closure and discussion about stroke prevention and treatment and answering questions. Antony Contras, MD  [facility] [address] [place], [place] [address]  Phone [phone] Fax [phone] Note: This document was prepared with digital dictation and possible smart phrase technology. Any transcriptional errors that result from this process are unintentional.

## 2020-08-29 NOTE — Patient Instructions (Signed)
I had a long d/w patient about her recent stroke, PFO endovascular closure,risk for recurrent stroke/TIAs, personally independently reviewed imaging studies and stroke evaluation results and answered questions.Continue aspirin 81 mg daily and clopidogrel 75 mg daily  for 6 months post PFO closure and then aspirin alone for secondary stroke prevention and maintain strict control of hypertension with blood pressure goal below 130/90, diabetes with hemoglobin A1c goal below 6.5% and lipids with LDL cholesterol goal below 70 mg/dL. I also advised the patient to eat a healthy diet with plenty of whole grains, cereals, fruits and vegetables, exercise regularly and maintain ideal body weight . She was asked to repeat fasting triglyceride level at next visit with her PCI in [place] in the future with me in 6 months or call earlier if necessary. Stroke Prevention Some medical conditions and behaviors are associated with a higher chance of having a stroke. You can help prevent a stroke by making nutrition, lifestyle,and other changes, including managing any medical conditions you may have. What nutrition changes can be made?  Eat healthy foods. You can do this by: Choosing foods high in fiber, such as fresh fruits and vegetables and whole grains. Eating at least 5 or more servings of fruits and vegetables a day. Try to fill half of your plate at each meal with fruits and vegetables. Choosing lean protein foods, such as lean cuts of meat, poultry without skin, fish, tofu, beans, and nuts. Eating low-fat dairy products. Avoiding foods that are high in salt (sodium). This can help lower blood pressure. Avoiding foods that have saturated fat, trans fat, and cholesterol. This can help prevent high cholesterol. Avoiding processed and premade foods. Follow your health care provider's specific guidelines for losing weight, controlling high blood pressure (hypertension), lowering high cholesterol, and managing  diabetes. These may include: Reducing your daily calorie intake. Limiting your daily sodium intake to 1,500 milligrams (mg). Using only healthy fats for cooking, such as olive oil, canola oil, or sunflower oil. Counting your daily carbohydrate intake. What lifestyle changes can be made? Maintain a healthy weight. Talk to your health care provider about your ideal weight. Get at least 30 minutes of moderate physical activity at least 5 days a week. Moderate activity includes brisk walking, biking, and swimming. Do not use any products that contain nicotine or tobacco, such as cigarettes and e-cigarettes. If you need help quitting, ask your health care provider. It may also be helpful to avoid exposure to secondhand smoke. Limit alcohol intake to no more than 1 drink a day for nonpregnant women and 2 drinks a day for men. One drink equals 12 oz of beer, 5 oz of wine, or 1 oz of hard liquor. Stop any illegal drug use. Avoid taking birth control pills. Talk to your health care provider about the risks of taking birth control pills if: You are over 24 years old. You smoke. You get migraines. You have ever had a blood clot. What other changes can be made? Manage your cholesterol levels. Eating a healthy diet is important for preventing high cholesterol. If cholesterol cannot be managed through diet alone, you may also need to take medicines. Take any prescribed medicines to control your cholesterol as told by your health care provider. Manage your diabetes. Eating a healthy diet and exercising regularly are important parts of managing your blood sugar. If your blood sugar cannot be managed through diet and exercise, you may need to take medicines. Take any prescribed medicines to control your diabetes as told  by your health care provider. Control your hypertension. To reduce your risk of stroke, try to keep your blood pressure below 130/80. Eating a healthy diet and exercising regularly are an  important part of controlling your blood pressure. If your blood pressure cannot be managed through diet and exercise, you may need to take medicines. Take any prescribed medicines to control hypertension as told by your health care provider. Ask your health care provider if you should monitor your blood pressure at home. Have your blood pressure checked every year, even if your blood pressure is normal. Blood pressure increases with age and some medical conditions. Get evaluated for sleep disorders (sleep apnea). Talk to your health care provider about getting a sleep evaluation if you snore a lot or have excessive sleepiness. Take over-the-counter and prescription medicines only as told by your health care provider. Aspirin or blood thinners (antiplatelets or anticoagulants) may be recommended to reduce your risk of forming blood clots that can lead to stroke. Make sure that any other medical conditions you have, such as atrial fibrillation or atherosclerosis, are managed. What are the warning signs of a stroke? The warning signs of a stroke can be easily remembered as BEFAST. B is for balance. Signs include: Dizziness. Loss of balance or coordination. Sudden trouble walking. E is for eyes. Signs include: A sudden change in vision. Trouble seeing. F is for face. Signs include: Sudden weakness or numbness of the face. The face or eyelid drooping to one side. A is for arms. Signs include: Sudden weakness or numbness of the arm, usually on one side of the body. S is for speech. Signs include: Trouble speaking (aphasia). Trouble understanding. T is for time. These symptoms may represent a serious problem that is an emergency. Do not wait to see if the symptoms will go away. Get medical help right away. Call your local emergency services (911 in the [place] Do not drive yourself to the hospital. Other signs of stroke may include: A sudden, severe headache with no known cause. Nausea or  vomiting. Seizure. Where to find more information For more information, visit: American Stroke Association: www.strokeassociation.org National Stroke Association: www.stroke.org Summary You can prevent a stroke by eating healthy, exercising, not smoking, limiting alcohol intake, and managing any medical conditions you may have. Do not use any products that contain nicotine or tobacco, such as cigarettes and e-cigarettes. If you need help quitting, ask your health care provider. It may also be helpful to avoid exposure to secondhand smoke. Remember BEFAST for warning signs of stroke. Get help right away if you or a loved one has any of these signs. This information is not intended to replace advice given to you by your health care provider. Make sure you discuss any questions you have with your healthcare provider. Document Revised: 02/14/2017 Document Reviewed: 04/09/2016 Elsevier Patient Education  2021 [employer].

## 2020-09-15 ENCOUNTER — Other Ambulatory Visit (HOSPITAL_COMMUNITY): Payer: Self-pay

## 2020-09-16 ENCOUNTER — Other Ambulatory Visit (HOSPITAL_COMMUNITY): Payer: Self-pay

## 2020-09-16 MED ORDER — AMOXICILLIN 500 MG PO CAPS
ORAL_CAPSULE | ORAL | 0 refills | Status: DC
  Filled 2020-09-16: qty 21, 7d supply, fill #0

## 2020-10-03 ENCOUNTER — Other Ambulatory Visit (HOSPITAL_COMMUNITY): Payer: Self-pay

## 2020-10-03 MED ORDER — VALACYCLOVIR HCL 1 G PO TABS
1000.0000 mg | ORAL_TABLET | Freq: Every day | ORAL | 0 refills | Status: DC
  Filled 2020-10-03: qty 90, 90d supply, fill #0

## 2020-10-03 MED ORDER — AMOXICILLIN 500 MG PO CAPS
2000.0000 mg | ORAL_CAPSULE | ORAL | 0 refills | Status: DC
  Filled 2020-10-03: qty 12, 3d supply, fill #0

## 2020-10-17 DIAGNOSIS — M25561 Pain in right knee: Secondary | ICD-10-CM | POA: Diagnosis not present

## 2020-10-18 DIAGNOSIS — L821 Other seborrheic keratosis: Secondary | ICD-10-CM | POA: Diagnosis not present

## 2020-10-18 DIAGNOSIS — L814 Other melanin hyperpigmentation: Secondary | ICD-10-CM | POA: Diagnosis not present

## 2020-10-18 DIAGNOSIS — D2271 Melanocytic nevi of right lower limb, including hip: Secondary | ICD-10-CM | POA: Diagnosis not present

## 2020-10-18 DIAGNOSIS — D1801 Hemangioma of skin and subcutaneous tissue: Secondary | ICD-10-CM | POA: Diagnosis not present

## 2020-10-18 DIAGNOSIS — L905 Scar conditions and fibrosis of skin: Secondary | ICD-10-CM | POA: Diagnosis not present

## 2020-10-18 DIAGNOSIS — D225 Melanocytic nevi of trunk: Secondary | ICD-10-CM | POA: Diagnosis not present

## 2020-10-26 ENCOUNTER — Ambulatory Visit: Payer: 59 | Admitting: Neurology

## 2020-11-07 ENCOUNTER — Telehealth: Payer: Self-pay | Admitting: *Deleted

## 2020-11-07 NOTE — Telephone Encounter (Addendum)
   Name: Sandra Clay  DOB: [birth year]  MRN: [id number]   Primary Cardiologist: Sherren Mocha, MD  Chart reviewed as part of pre-operative protocol coverage. Patient was contacted 11/07/2020 in reference to pre-operative risk assessment for pending surgery as outlined below.  Sandra Clay was last seen 07/2020 by Nell Range PA-C with pertinent CV hx to include cryptogenic stroke and PFO closure 06/2020. EF normal. Per last OV, patient was to continue Plavix x 6 months and "STOP PLAVIX when your pills run out (around 12/28/2020)." I called patient to find out when her procedure will be (MyChart message states end of the year). If after 10/13, stopping Plavix will not be an issue. LMTCB.  ADDENDUM: patient returned call. The patient affirms she has been doing well without any new cardiac symptoms - doing the elliptical 5 days per week without angina or dyspnea. Therefore, based on ACC/AHA guidelines, the patient would be at acceptable risk for the planned procedure without further cardiovascular testing. The patient was advised that if she develops new symptoms prior to surgery to contact our office to arrange for a follow-up visit, and she verbalized understanding.  She clarifies the procedure is a meniscus surgery rather than total knee, and that it's planned for sometime in November so she will already be OFF Plavix at that time (she is aware to stop in October as above). However, will route to Dr. Burt Knack to find out if she may hold aspirin as requested for procedure, or if he would request she remain on it perioperatively. Dr. Burt Knack - Please route response to P [facility] PREOP (the pre-op pool). Thank you.   Charlie Pitter, PA-C 11/07/2020, 12:09 PM

## 2020-11-07 NOTE — Telephone Encounter (Signed)
    Patient Name: Sandra Clay  DOB: 06/28/1955 MRN: [id number]  Primary Cardiologist: Sherren Mocha, MD  Chart revisited as part of pre-operative protocol coverage. As below, given past medical history and time since last visit, based on ACC/AHA guidelines, SHAKERRIA GARZA would be at acceptable risk for the planned procedure without further cardiovascular testing. The patient was advised that if she develops new symptoms prior to surgery to contact our office to arrange for a follow-up visit, and she verbalized understanding.  Patient states surgery is anticipated sometime in November. At that time she will no longer be on Plavix (plan was for Plavix for 6 months after PFO closure in 06/2020, so she was advised to stop Plavix when her pills run out around 12/28/20). Regarding aspirin, Dr. Burt Knack has given clearance to hold aspirin pre-procedure as requested if needed.  I will route this recommendation to the requesting party via Epic fax function and remove from pre-op pool.  Please call with questions.  Charlie Pitter, PA-C 11/07/2020, 2:02 PM

## 2020-11-07 NOTE — Telephone Encounter (Signed)
Ok to hold aspirin pre-procedure as requested. Thank you

## 2020-11-07 NOTE — Telephone Encounter (Signed)
   [facility] Pre-operative Risk Assessment    Patient Name: Sandra Clay  DOB: [birth year] MRN: [id number]  HEARTCARE STAFF:  - IMPORTANT!!!!!! Under Visit Info/Reason for Call, type in Other and utilize the format Clearance MM/DD/YY or Clearance TBD. Do not use dashes or single digits. - Please review there is not already an duplicate clearance open for this procedure. - If request is for dental extraction, please clarify the # of teeth to be extracted. - If the patient is currently at the dentist's office, call Pre-Op Callback Staff (MA/nurse) to input urgent request.  - If the patient is not currently in the dentist office, please route to the Pre-Op pool.  Request for surgical clearance:  What type of surgery is being performed? RIGHT TOTAL KNEE ARTHROPLASTY  When is this surgery scheduled? TBD  What type of clearance is required (medical clearance vs. Pharmacy clearance to hold med vs. Both)? MEDICAL  Are there any medications that need to be held prior to surgery and how long?  ASA AND PLAVIX  Practice name and name of physician performing surgery? [facility]; DR. Gaynelle Arabian  What is the office phone number? [phone]   7.   What is the office fax number? [phone]  8.   Anesthesia type (None, local, MAC, general) ? CHOICE   Julaine Hua 11/07/2020, 9:52 AM  _________________________________________________________________   (provider comments below)

## 2020-11-30 DIAGNOSIS — S83281D Other tear of lateral meniscus, current injury, right knee, subsequent encounter: Secondary | ICD-10-CM | POA: Diagnosis not present

## 2020-11-30 DIAGNOSIS — S83241D Other tear of medial meniscus, current injury, right knee, subsequent encounter: Secondary | ICD-10-CM | POA: Diagnosis not present

## 2020-12-01 ENCOUNTER — Other Ambulatory Visit (HOSPITAL_COMMUNITY): Payer: Self-pay

## 2020-12-01 MED FILL — Rosuvastatin Calcium Tab 20 MG: ORAL | 90 days supply | Qty: 90 | Fill #1 | Status: AC

## 2020-12-12 DIAGNOSIS — Z6825 Body mass index (BMI) 25.0-25.9, adult: Secondary | ICD-10-CM | POA: Diagnosis not present

## 2020-12-12 DIAGNOSIS — Z01419 Encounter for gynecological examination (general) (routine) without abnormal findings: Secondary | ICD-10-CM | POA: Diagnosis not present

## 2020-12-29 ENCOUNTER — Other Ambulatory Visit (HOSPITAL_COMMUNITY): Payer: Self-pay

## 2020-12-29 MED ORDER — VALACYCLOVIR HCL 1 G PO TABS
1000.0000 mg | ORAL_TABLET | Freq: Every day | ORAL | 3 refills | Status: DC
  Filled 2020-12-29: qty 90, 90d supply, fill #0
  Filled 2021-04-07: qty 90, 90d supply, fill #1
  Filled 2021-07-07: qty 90, 90d supply, fill #2
  Filled 2021-10-12: qty 90, 90d supply, fill #3

## 2021-01-02 ENCOUNTER — Encounter: Payer: Self-pay | Admitting: Family Medicine

## 2021-01-02 ENCOUNTER — Other Ambulatory Visit: Payer: Self-pay

## 2021-01-02 ENCOUNTER — Ambulatory Visit (INDEPENDENT_AMBULATORY_CARE_PROVIDER_SITE_OTHER): Payer: 59 | Admitting: Family Medicine

## 2021-01-02 VITALS — BP 133/75 | HR 74 | Temp 98.2°F | Ht 66.0 in | Wt 165.4 lb

## 2021-01-02 DIAGNOSIS — Q2112 Patent foramen ovale: Secondary | ICD-10-CM | POA: Diagnosis not present

## 2021-01-02 DIAGNOSIS — Z01818 Encounter for other preprocedural examination: Secondary | ICD-10-CM

## 2021-01-02 DIAGNOSIS — Z6826 Body mass index (BMI) 26.0-26.9, adult: Secondary | ICD-10-CM | POA: Diagnosis not present

## 2021-01-02 DIAGNOSIS — R739 Hyperglycemia, unspecified: Secondary | ICD-10-CM | POA: Diagnosis not present

## 2021-01-02 DIAGNOSIS — Z13220 Encounter for screening for lipoid disorders: Secondary | ICD-10-CM | POA: Diagnosis not present

## 2021-01-02 DIAGNOSIS — I639 Cerebral infarction, unspecified: Secondary | ICD-10-CM | POA: Diagnosis not present

## 2021-01-02 DIAGNOSIS — Z0001 Encounter for general adult medical examination with abnormal findings: Secondary | ICD-10-CM

## 2021-01-02 DIAGNOSIS — E663 Overweight: Secondary | ICD-10-CM

## 2021-01-02 NOTE — Assessment & Plan Note (Signed)
Doing well s/p closure.

## 2021-01-02 NOTE — Progress Notes (Signed)
Chief Complaint:  Sandra Clay is a 65 y.o. female who presents today for her annual comprehensive physical exam.    Assessment/Plan:  New/Acute Problems: Encounter for Surgical Clearance Will like requested labs.  She is overall low risk for complications.  Has excellent functional status.  Will clear for surgery depending on results of labs.  Chronic Problems Addressed Today: Hyperglycemia Check A1c.   PFO (patent foramen ovale) Doing well s/p closure.  Cerebrovascular accident (CVA) [facility]) Doing much better. No residual deficits. Will continue aggressive risk factor modification. She is on aspirin and statin. Check labs.    Body mass index is 26.69 kg/m. / Overweight    Preventative Healthcare: UTD on flu vaccine.  Check labs. UTD on colonoscopy, pap, and mammo. Check EKG.  Patient Counseling(The following topics were reviewed and/or handout was given):  -Nutrition: Stressed importance of moderation in sodium/caffeine intake, saturated fat and cholesterol, caloric balance, sufficient intake of fresh fruits, vegetables, and fiber.  -Stressed the importance of regular exercise.   -Substance Abuse: Discussed cessation/primary prevention of tobacco, alcohol, or other drug use; driving or other dangerous activities under the influence; availability of treatment for abuse.   -Injury prevention: Discussed safety belts, safety helmets, smoke detector, smoking near bedding or upholstery.   -Sexuality: Discussed sexually transmitted diseases, partner selection, use of condoms, avoidance of unintended pregnancy and contraceptive alternatives.   -Dental health: Discussed importance of regular tooth brushing, flossing, and dental visits.  -Health maintenance and immunizations reviewed. Please refer to Health maintenance section.  Return to care in 1 year for next preventative visit.     Subjective:  HPI:  She has no acute complaints today.   She is here for surgical clearance. She  tore meniscus in her right knee. She is undergoing arthroscopic meniscus surgery on November 22 for this issue.  Lifestyle Diet: Balanced Exercise: Cardio 30 minutes a day.  Depression screen PHQ 2/9 01/02/2021  Decreased Interest 0  Down, Depressed, Hopeless 0  PHQ - 2 Score 0  Altered sleeping -  Tired, decreased energy -  Change in appetite -  Feeling bad or failure about yourself  -  Trouble concentrating -  Moving slowly or fidgety/restless -  Suicidal thoughts -  PHQ-9 Score -  Difficult doing work/chores -    Health Maintenance Due  Topic Date Due   DEXA SCAN  Never done     ROS: Per HPI, otherwise a complete review of systems was negative.   PMH:  The following were reviewed and entered/updated in epic: Past Medical History:  Diagnosis Date   Anxiety    Cancer ([place])    skin cancer    Cataract    left forming    Depression    situational , resolved    Headache(784.0)    Osteoarthritis    PONV (postoperative nausea and vomiting)    Stroke ([place]) 03/2020   Patient Active Problem List   Diagnosis Date Noted   PFO (patent foramen ovale)    Cerebrovascular accident (CVA) ([place]) 04/17/2020   History of hip replacement 08/18/2018   OA (osteoarthritis) of hip 08/03/2018   Hyperglycemia 05/18/2018   Skin lump of leg, right 12/30/2017   Genital herpes 12/30/2017   Past Surgical History:  Procedure Laterality Date   BUBBLE STUDY  05/19/2020   Procedure: BUBBLE STUDY;  Surgeon: Sanda Klein, MD;  Location: [facility];  Service: Cardiovascular;;   BUNIONECTOMY     CESAREAN SECTION     x3  CHOLECYSTECTOMY     COLONOSCOPY     ~51 ish per pt  bucini   FRACTURE SURGERY     KNEE ARTHROSCOPY W/ MENISCAL REPAIR Right    x2   PATENT FORAMEN OVALE(PFO) CLOSURE N/A 06/30/2020   Procedure: PATENT FORAMEN OVALE (PFO) CLOSURE;  Surgeon: Sherren Mocha, MD;  Location: [facility];  Service: Cardiovascular;  Laterality: N/A;   ROTATOR CUFF REPAIR  2018    right    TEE WITHOUT CARDIOVERSION N/A 05/19/2020   Procedure: TRANSESOPHAGEAL ECHOCARDIOGRAM (TEE);  Surgeon: Sanda Klein, MD;  Location: [facility];  Service: Cardiovascular;  Laterality: N/A;   TOTAL HIP ARTHROPLASTY Left 08/03/2018   Procedure: TOTAL HIP ARTHROPLASTY ANTERIOR APPROACH;  Surgeon: Gaynelle Arabian, MD;  Location: [facility];  Service: Orthopedics;  Laterality: Left;   TUBAL LIGATION     tubal reversal      Family History  Problem Relation Age of Onset   Alcohol abuse Mother    Breast cancer Mother    Alcohol abuse Father    Arthritis Father    Cancer Father    Diabetes Father    Hearing loss Father    Colon cancer Sister 98   Alcohol abuse Brother    Cancer Brother    Depression Brother    Drug abuse Brother    Early death Brother    Hearing loss Brother    Depression Sister    Alcohol abuse Sister    Breast cancer Maternal Aunt    Colon polyps Neg Hx    Esophageal cancer Neg Hx    Rectal cancer Neg Hx    Stomach cancer Neg Hx     Medications- reviewed and updated Current Outpatient Medications  Medication Sig Dispense Refill   aspirin EC 81 MG tablet Take 81 mg by mouth in the morning. Swallow whole.     doxylamine, Sleep, (UNISOM) 25 MG tablet Take 25 mg by mouth at bedtime.     rosuvastatin (CRESTOR) 20 MG tablet TAKE 1 TABLET BY MOUTH ONCE A DAY (Patient taking differently: Take 20 mg by mouth in the morning.) 90 tablet 3   valACYclovir (VALTREX) 1000 MG tablet TAKE 1 TABLET BY MOUTH ONCE DAILY 90 tablet 3   No current facility-administered medications for this visit.    Allergies-reviewed and updated Allergies  Allergen Reactions   Dilaudid [Hydromorphone] Itching   Sulfa Antibiotics Rash    Social History   Socioeconomic History   Marital status: Single    Spouse name: Not on file   Number of children: Not on file   Years of education: Not on file   Highest education level: Not on file  Occupational History   Occupation: Full time   Tobacco Use   Smoking status: Former   Smokeless tobacco: Never  Substance and Sexual Activity   Alcohol use: Yes    Alcohol/week: 1.0 - 2.0 standard drink    Types: 1 - 2 Glasses of wine per week    Comment: one to two glasses of wine per day    Drug use: No   Sexual activity: Yes    Birth control/protection: Post-menopausal  Other Topics Concern   Not on file  Social History Narrative   Lives with significant other, Carter.   Right Handed   Drinks 2-3 cups caffeine daily   Social Determinants of Health   Financial Resource Strain: Not on file  Food Insecurity: Not on file  Transportation Needs: Not on file  Physical Activity: Not  on file  Stress: Not on file  Social Connections: Not on file        Objective:  Physical Exam: BP 133/75   Pulse 74   Temp 98.2 F (36.8 C)   Ht 5\' 6"  (1.676 m)   Wt 165 lb 6.1 oz (75 kg)   SpO2 99%   BMI 26.69 kg/m   Body mass index is 26.69 kg/m. Wt Readings from Last 3 Encounters:  01/02/21 165 lb 6.1 oz (75 kg)  08/29/20 163 lb 6.4 oz (74.1 kg)  08/02/20 161 lb 9.6 oz (73.3 kg)   Gen: NAD, resting comfortably HEENT: TMs normal bilaterally. OP clear. No thyromegaly noted.  CV: RRR with no murmurs appreciated Pulm: NWOB, CTAB with no crackles, wheezes, or rhonchi GI: Normal bowel sounds present. Soft, Nontender, Nondistended. MSK: no edema, cyanosis, or clubbing noted Skin: warm, dry Neuro: CN2-12 grossly intact. Strength 5/5 in upper and lower extremities. Reflexes symmetric and intact bilaterally.  Psych: Normal affect and thought content  EKG: NSR. No ischemic changes.       I,Savera Zaman,acting as a Education administrator for Dimas Chyle, MD.,have documented all relevant documentation on the behalf of Dimas Chyle, MD,as directed by  Dimas Chyle, MD while in the presence of Dimas Chyle, MD.   I, Dimas Chyle, MD, have reviewed all documentation for this visit. The documentation on 01/02/21 for the exam, diagnosis, procedures, and  orders are all accurate and complete.  [name]. [name], MD 01/02/2021 9:25 AM

## 2021-01-02 NOTE — Assessment & Plan Note (Signed)
Doing much better. No residual deficits. Will continue aggressive risk factor modification. She is on aspirin and statin. Check labs.

## 2021-01-02 NOTE — Assessment & Plan Note (Signed)
Check A1c. 

## 2021-01-02 NOTE — Patient Instructions (Signed)
It was very nice to see you today!  Please come back to check blood work.   Continue working on diet and exercise  Please have your surgeon fax over the clearance form.  I will see you back in 1 year for your next physical.  Come back sooner if needed.  Take care, Dr [name]  PLEASE NOTE:  If you had any lab tests please let us know if you have not heard back within a few days. You may see your results on mychart before we have a chance to review them but we will give you a call once they are reviewed by [place]. If we ordered any referrals today, please let us know if you have not heard from their office within the next week.   Please try these tips to maintain a healthy lifestyle:  Eat at least 3 REAL meals and 1-2 snacks per day.  Aim for no more than 5 hours between eating.  If you eat breakfast, please do so within one hour of getting up.   Each meal should contain half fruits/vegetables, one quarter protein, and one quarter carbs (no bigger than a computer mouse)  Cut down on sweet beverages. This includes juice, soda, and sweet tea.   Drink at least 1 glass of water with each meal and aim for at least 8 glasses per day  Exercise at least 150 minutes every week.    Preventive Care 51 Years and Older, Female Preventive care refers to lifestyle choices and visits with your health care provider that can promote health and wellness. This includes: A yearly physical exam. This is also called an annual wellness visit. Regular dental and eye exams. Immunizations. Screening for certain conditions. Healthy lifestyle choices, such as: Eating a healthy diet. Getting regular exercise. Not using drugs or products that contain nicotine and tobacco. Limiting alcohol use. What can I expect for my preventive care visit? Physical exam Your health care provider will check your: Height and weight. These may be used to calculate your BMI (body mass index). BMI is a measurement that tells if you  are at a healthy weight. Heart rate and blood pressure. Body temperature. Skin for abnormal spots. Counseling Your health care provider may ask you questions about your: Past medical problems. Family's medical history. Alcohol, tobacco, and drug use. Emotional well-being. Home life and relationship well-being. Sexual activity. Diet, exercise, and sleep habits. History of falls. Memory and ability to understand (cognition). Work and work Statistician. Pregnancy and menstrual history. Access to firearms. What immunizations do I need? Vaccines are usually given at various ages, according to a schedule. Your health care provider will recommend vaccines for you based on your age, medical history, and lifestyle or other factors, such as travel or where you work. What tests do I need? Blood tests Lipid and cholesterol levels. These may be checked every 5 years, or more often depending on your overall health. Hepatitis C test. Hepatitis B test. Screening Lung cancer screening. You may have this screening every year starting at age 61 if you have a 30-pack-year history of smoking and currently smoke or have quit within the past 15 years. Colorectal cancer screening. All adults should have this screening starting at age 2 and continuing until age 21. Your health care provider may recommend screening at age 49 if you are at increased risk. You will have tests every 1-10 years, depending on your results and the type of screening test. Diabetes screening. This is done by checking  your blood sugar (glucose) after you have not eaten for a while (fasting). You may have this done every 1-3 years. Mammogram. This may be done every 1-2 years. Talk with your health care provider about how often you should have regular mammograms. Abdominal aortic aneurysm (AAA) screening. You may need this if you are a current or former smoker. BRCA-related cancer screening. This may be done if you have a family  history of breast, ovarian, tubal, or peritoneal cancers. Other tests STD (sexually transmitted disease) testing, if you are at risk. Bone density scan. This is done to screen for osteoporosis. You may have this done starting at age 85. Talk with your health care provider about your test results, treatment options, and if necessary, the need for more tests. Follow these instructions at home: Eating and drinking  Eat a diet that includes fresh fruits and vegetables, whole grains, lean protein, and low-fat dairy products. Limit your intake of foods with high amounts of sugar, saturated fats, and salt. Take vitamin and mineral supplements as recommended by your health care provider. Do not drink alcohol if your health care provider tells you not to drink. If you drink alcohol: Limit how much you have to 0-1 drink a day. Be aware of how much alcohol is in your drink. In the [place] one drink equals one 12 oz bottle of beer (355 mL), one 5 oz glass of wine (148 mL), or one 1 oz glass of hard liquor (44 mL). Lifestyle Take daily care of your teeth and gums. Brush your teeth every morning and night with fluoride toothpaste. Floss one time each day. Stay active. Exercise for at least 30 minutes 5 or more days each week. Do not use any products that contain nicotine or tobacco, such as cigarettes, e-cigarettes, and chewing tobacco. If you need help quitting, ask your health care provider. Do not use drugs. If you are sexually active, practice safe sex. Use a condom or other form of protection in order to prevent STIs (sexually transmitted infections). Talk with your health care provider about taking a low-dose aspirin or statin. Find healthy ways to cope with stress, such as: Meditation, yoga, or listening to music. Journaling. Talking to a trusted person. Spending time with friends and family. Safety Always wear your seat belt while driving or riding in a vehicle. Do not drive: If you have been  drinking alcohol. Do not ride with someone who has been drinking. When you are tired or distracted. While texting. Wear a helmet and other protective equipment during sports activities. If you have firearms in your house, make sure you follow all gun safety procedures. What's next? Visit your health care provider once a year for an annual wellness visit. Ask your health care provider how often you should have your eyes and teeth checked. Stay up to date on all vaccines. This information is not intended to replace advice given to you by your health care provider. Make sure you discuss any questions you have with your health care provider. Document Revised: 05/12/2020 Document Reviewed: 02/26/2018 Elsevier Patient Education  2022 [employer].

## 2021-01-08 ENCOUNTER — Other Ambulatory Visit (INDEPENDENT_AMBULATORY_CARE_PROVIDER_SITE_OTHER): Payer: 59

## 2021-01-08 ENCOUNTER — Other Ambulatory Visit: Payer: Self-pay

## 2021-01-08 DIAGNOSIS — Z0001 Encounter for general adult medical examination with abnormal findings: Secondary | ICD-10-CM

## 2021-01-08 DIAGNOSIS — Z01818 Encounter for other preprocedural examination: Secondary | ICD-10-CM

## 2021-01-08 DIAGNOSIS — R739 Hyperglycemia, unspecified: Secondary | ICD-10-CM | POA: Diagnosis not present

## 2021-01-08 DIAGNOSIS — Z13220 Encounter for screening for lipoid disorders: Secondary | ICD-10-CM | POA: Diagnosis not present

## 2021-01-08 LAB — CBC
HCT: 41.5 % (ref 36.0–46.0)
Hemoglobin: 13.9 g/dL (ref 12.0–15.0)
MCHC: 33.5 g/dL (ref 30.0–36.0)
MCV: 97.3 fl (ref 78.0–100.0)
Platelets: 213 10*3/uL (ref 150.0–400.0)
RBC: 4.27 Mil/uL (ref 3.87–5.11)
RDW: 13.9 % (ref 11.5–15.5)
WBC: 3.9 10*3/uL — ABNORMAL LOW (ref 4.0–10.5)

## 2021-01-08 LAB — LIPID PANEL
Cholesterol: 145 mg/dL (ref 0–200)
HDL: 78.6 mg/dL (ref >39.00)
LDL Cholesterol: 58 mg/dL (ref 0–99)
NonHDL: 66.54
Total CHOL/HDL Ratio: 2
Triglycerides: 42 mg/dL (ref 0.0–149.0)
VLDL: 8.4 mg/dL (ref 0.0–40.0)

## 2021-01-08 LAB — URINALYSIS, ROUTINE W REFLEX MICROSCOPIC
Bilirubin Urine: NEGATIVE
Hgb urine dipstick: NEGATIVE
Ketones, ur: NEGATIVE
Nitrite: NEGATIVE
RBC / HPF: NONE SEEN (ref 0–?)
Specific Gravity, Urine: 1.015 (ref 1.000–1.030)
Total Protein, Urine: NEGATIVE
Urine Glucose: NEGATIVE
Urobilinogen, UA: 0.2 (ref 0.0–1.0)
pH: 7 (ref 5.0–8.0)

## 2021-01-08 LAB — PROTIME-INR
INR: 0.9 ratio (ref 0.8–1.0)
Prothrombin Time: 10.3 s (ref 9.6–13.1)

## 2021-01-08 LAB — COMPREHENSIVE METABOLIC PANEL
ALT: 45 U/L — ABNORMAL HIGH (ref 0–35)
AST: 36 U/L (ref 0–37)
Albumin: 4.5 g/dL (ref 3.5–5.2)
Alkaline Phosphatase: 105 U/L (ref 39–117)
BUN: 13 mg/dL (ref 6–23)
CO2: 28 mEq/L (ref 19–32)
Calcium: 10 mg/dL (ref 8.4–10.5)
Chloride: 105 mEq/L (ref 96–112)
Creatinine, Ser: 0.84 mg/dL (ref 0.40–1.20)
GFR: 71.73 mL/min (ref >60.00)
Glucose, Bld: 106 mg/dL — ABNORMAL HIGH (ref 70–99)
Potassium: 4.7 mEq/L (ref 3.5–5.1)
Sodium: 142 mEq/L (ref 135–145)
Total Bilirubin: 0.6 mg/dL (ref 0.2–1.2)
Total Protein: 7.4 g/dL (ref 6.0–8.3)

## 2021-01-08 LAB — HEMOGLOBIN A1C: Hgb A1c MFr Bld: 5.7 % (ref 4.6–6.5)

## 2021-01-08 LAB — TSH: TSH: 2.63 u[IU]/mL (ref 0.35–5.50)

## 2021-01-09 NOTE — Progress Notes (Signed)
Please inform patient of the following:  Blood sugar is borderline but stable. Everything else is within acceptable ranges. Do not need to make any changes to her treatment plan at this time. She should continue working on diet and exercise and we can recheck in a year.  It is ok to fax her surgical clearance form.  Sandra Clay. [name], MD 01/09/2021 8:08 AM

## 2021-01-10 ENCOUNTER — Encounter: Payer: Self-pay | Admitting: Family

## 2021-01-10 ENCOUNTER — Telehealth (INDEPENDENT_AMBULATORY_CARE_PROVIDER_SITE_OTHER): Payer: 59 | Admitting: Family

## 2021-01-10 VITALS — Ht 66.0 in | Wt 165.3 lb

## 2021-01-10 DIAGNOSIS — J069 Acute upper respiratory infection, unspecified: Secondary | ICD-10-CM | POA: Insufficient documentation

## 2021-01-10 NOTE — Progress Notes (Signed)
MyChart Video Visit    Virtual Visit via Video Note   This visit type was conducted due to national recommendations for restrictions regarding the COVID-19 Pandemic (e.g. social distancing) in an effort to limit this patient's exposure and mitigate transmission in our community. This patient is at least at moderate risk for complications without adequate follow up. This format is felt to be most appropriate for this patient at this time. Physical exam was limited by quality of the video and audio technology used for the visit. CMA was able to get the patient set up on a video visit.  Patient location: Home. Patient and provider in visit Provider location: Office  I discussed the limitations of evaluation and management by telemedicine and the availability of in person appointments. The patient expressed understanding and agreed to proceed.  Visit Date: 01/10/2021  Today's healthcare provider: Jeanie Sewer, NP     Subjective:    Patient ID: Sandra Clay, female    DOB: Aug 09, 1955, 65 y.o.   MRN: [id number]  Chief Complaint  Patient presents with   Facial Pain   Headache    Covid Test were negative.    Cough   Sore Throat    That comes and goes.    HPI:  Upper Respiratory Infection: Patient complains of symptoms of a URI. Symptoms include congestion, cough, sore throat, and headache . Onset of symptoms was 3 days ago, unchanged since that time.  She is drinking moderate amounts of fluids. Evaluation to date: none. Treatment to date: none.       Past Medical History:  Diagnosis Date   Anxiety    Cancer ([place])    skin cancer    Cataract    left forming    Depression    situational , resolved    Headache(784.0)    Osteoarthritis    PONV (postoperative nausea and vomiting)    Stroke ([place]) 03/2020    Past Surgical History:  Procedure Laterality Date   BUBBLE STUDY  05/19/2020   Procedure: BUBBLE STUDY;  Surgeon: Sanda Klein, MD;  Location: [facility];   Service: Cardiovascular;;   BUNIONECTOMY     CESAREAN SECTION     x3   CHOLECYSTECTOMY     COLONOSCOPY     ~51 ish per pt  bucini   FRACTURE SURGERY     KNEE ARTHROSCOPY W/ MENISCAL REPAIR Right    x2   PATENT FORAMEN OVALE(PFO) CLOSURE N/A 06/30/2020   Procedure: PATENT FORAMEN OVALE (PFO) CLOSURE;  Surgeon: Sherren Mocha, MD;  Location: [facility];  Service: Cardiovascular;  Laterality: N/A;   ROTATOR CUFF REPAIR  2018   right    TEE WITHOUT CARDIOVERSION N/A 05/19/2020   Procedure: TRANSESOPHAGEAL ECHOCARDIOGRAM (TEE);  Surgeon: Sanda Klein, MD;  Location: [facility];  Service: Cardiovascular;  Laterality: N/A;   TOTAL HIP ARTHROPLASTY Left 08/03/2018   Procedure: TOTAL HIP ARTHROPLASTY ANTERIOR APPROACH;  Surgeon: Gaynelle Arabian, MD;  Location: [facility];  Service: Orthopedics;  Laterality: Left;   TUBAL LIGATION     tubal reversal      Outpatient Medications Prior to Visit  Medication Sig Dispense Refill   aspirin EC 81 MG tablet Take 81 mg by mouth in the morning. Swallow whole.     doxylamine, Sleep, (UNISOM) 25 MG tablet Take 25 mg by mouth at bedtime.     rosuvastatin (CRESTOR) 20 MG tablet TAKE 1 TABLET BY MOUTH ONCE A DAY (Patient taking differently: Take 20 mg by  mouth in the morning.) 90 tablet 3   valACYclovir (VALTREX) 1000 MG tablet TAKE 1 TABLET BY MOUTH ONCE DAILY 90 tablet 3   No facility-administered medications prior to visit.    Allergies  Allergen Reactions   Dilaudid [Hydromorphone] Itching   Sulfa Antibiotics Rash        Objective:    Physical Exam Vitals and nursing note reviewed.  Constitutional:      General: She is not in acute distress.    Appearance: Normal appearance.  HENT:     Head: Normocephalic.  Pulmonary:     Effort: No respiratory distress.  Musculoskeletal:     Cervical back: Normal range of motion.  Skin:    General: Skin is dry.     Coloration: Skin is not pale.  Neurological:     Mental Status: She is alert  and oriented to person, place, and time.  Psychiatric:        Mood and Affect: Mood normal.    Ht 5\' 6"  (1.676 m)   Wt 165 lb 5.5 oz (75 kg)   BMI 26.69 kg/m   Wt Readings from Last 3 Encounters:  01/10/21 165 lb 5.5 oz (75 kg)  01/02/21 165 lb 6.1 oz (75 kg)  08/29/20 163 lb 6.4 oz (74.1 kg)       Assessment & Plan:   Problem List Items Addressed This Visit       Respiratory   Viral upper respiratory tract infection - Primary    Sx for 3days, pt has received flu & covid19 vaccines recently. Advised on restarting Flonase nasal spray bid, generic Sudafed or Claritin D for 5 days, 2-3 200mg  Ibuprofen 3x/day after eating, drink plenty of fluids, and rest.        I discussed the assessment and treatment plan with the patient. The patient was provided an opportunity to ask questions and all were answered. The patient agreed with the plan and demonstrated an understanding of the instructions.   The patient was advised to call back or seek an in-person evaluation if the symptoms worsen or if the condition fails to improve as anticipated.  I provided 20 minutes of face-to-face time during this encounter.   Jeanie Sewer, NP [place] [phone] (phone) [phone] (fax)  [place]

## 2021-01-10 NOTE — Assessment & Plan Note (Signed)
Sx for 3days, pt has received flu & covid19 vaccines recently. Advised on restarting Flonase nasal spray bid, generic Sudafed or Claritin D for 5 days, 2-3 200mg  Ibuprofen 3x/day after eating, drink plenty of fluids, and rest.

## 2021-01-31 ENCOUNTER — Other Ambulatory Visit (HOSPITAL_COMMUNITY): Payer: Self-pay

## 2021-01-31 MED ORDER — HYDROCODONE-ACETAMINOPHEN 5-325 MG PO TABS
ORAL_TABLET | ORAL | 0 refills | Status: DC
  Filled 2021-01-31: qty 20, 5d supply, fill #0

## 2021-01-31 MED ORDER — METHOCARBAMOL 500 MG PO TABS
ORAL_TABLET | ORAL | 0 refills | Status: DC
  Filled 2021-01-31: qty 40, 10d supply, fill #0

## 2021-02-06 DIAGNOSIS — M948X6 Other specified disorders of cartilage, lower leg: Secondary | ICD-10-CM | POA: Diagnosis not present

## 2021-02-06 DIAGNOSIS — M23261 Derangement of other lateral meniscus due to old tear or injury, right knee: Secondary | ICD-10-CM | POA: Diagnosis not present

## 2021-02-06 DIAGNOSIS — G8918 Other acute postprocedural pain: Secondary | ICD-10-CM | POA: Diagnosis not present

## 2021-02-06 DIAGNOSIS — M958 Other specified acquired deformities of musculoskeletal system: Secondary | ICD-10-CM | POA: Diagnosis not present

## 2021-02-06 DIAGNOSIS — M23361 Other meniscus derangements, other lateral meniscus, right knee: Secondary | ICD-10-CM | POA: Diagnosis not present

## 2021-02-06 DIAGNOSIS — M23221 Derangement of posterior horn of medial meniscus due to old tear or injury, right knee: Secondary | ICD-10-CM | POA: Diagnosis not present

## 2021-02-06 DIAGNOSIS — M23321 Other meniscus derangements, posterior horn of medial meniscus, right knee: Secondary | ICD-10-CM | POA: Diagnosis not present

## 2021-02-06 DIAGNOSIS — M2241 Chondromalacia patellae, right knee: Secondary | ICD-10-CM | POA: Diagnosis not present

## 2021-03-01 ENCOUNTER — Ambulatory Visit: Payer: 59 | Admitting: Neurology

## 2021-03-02 DIAGNOSIS — H2513 Age-related nuclear cataract, bilateral: Secondary | ICD-10-CM | POA: Diagnosis not present

## 2021-03-07 ENCOUNTER — Other Ambulatory Visit (HOSPITAL_COMMUNITY): Payer: Self-pay

## 2021-03-07 MED FILL — Rosuvastatin Calcium Tab 20 MG: ORAL | 90 days supply | Qty: 90 | Fill #2 | Status: CN

## 2021-03-15 ENCOUNTER — Other Ambulatory Visit (HOSPITAL_COMMUNITY): Payer: Self-pay

## 2021-03-16 ENCOUNTER — Other Ambulatory Visit (HOSPITAL_COMMUNITY): Payer: Self-pay

## 2021-03-16 MED FILL — Rosuvastatin Calcium Tab 20 MG: ORAL | 90 days supply | Qty: 90 | Fill #2 | Status: AC

## 2021-03-27 ENCOUNTER — Ambulatory Visit: Payer: 59 | Admitting: Neurology

## 2021-03-27 ENCOUNTER — Encounter: Payer: Self-pay | Admitting: Neurology

## 2021-03-27 VITALS — BP 140/80 | HR 64 | Ht 66.0 in | Wt 168.0 lb

## 2021-03-27 DIAGNOSIS — Q2112 Patent foramen ovale: Secondary | ICD-10-CM | POA: Diagnosis not present

## 2021-03-27 DIAGNOSIS — Z8673 Personal history of transient ischemic attack (TIA), and cerebral infarction without residual deficits: Secondary | ICD-10-CM | POA: Diagnosis not present

## 2021-03-27 NOTE — Patient Instructions (Signed)
I had a long discussion with the patient with regards to remote stroke and PFO closure and answered questions.  Continue aspirin for stroke prevention and maintain aggressive risk factor modification with strict control of hypertension with blood pressure goal below 140/90, lipids with LDL cholesterol goal below 70 mg percent and diabetes with hemoglobin A1c goal below 6.5%.  She was encouraged to eat a healthy diet with lots of fruits, vegetables, cereals, whole grains and to be active and exercise regularly and lose weight.  Check transcranial Doppler bubble study to assess adequacy of PFO closure.  Return for follow-up in the future in 1 year or call earlier if necessary. Stroke Prevention Some medical conditions and behaviors can lead to a higher chance of having a stroke. You can help prevent a stroke by eating healthy, exercising, not smoking, and managing any medical conditions you have. Stroke is a leading cause of functional impairment. Primary prevention is particularly important because a majority of strokes are first-time events. Stroke changes the lives of not only those who experience a stroke but also their family and other caregivers. How can this condition affect me? A stroke is a medical emergency and should be treated right away. A stroke can lead to brain damage and can sometimes be life-threatening. If a person gets medical treatment right away, there is a better chance of surviving and recovering from a stroke. What can increase my risk? The following medical conditions may increase your risk of a stroke: Cardiovascular disease. High blood pressure (hypertension). Diabetes. High cholesterol. Sickle cell disease. Blood clotting disorders (hypercoagulable state). Obesity. Sleep disorders (obstructive sleep apnea). Other risk factors include: Being older than age 17. Having a history of blood clots, stroke, or mini-stroke (transient ischemic attack, TIA). Genetic factors, such  as race, ethnicity, or a family history of stroke. Smoking cigarettes or using other tobacco products. Taking birth control pills, especially if you also use tobacco. Heavy use of alcohol or drugs, especially cocaine and methamphetamine. Physical inactivity. What actions can I take to prevent this? Manage your health conditions High cholesterol levels. Eating a healthy diet is important for preventing high cholesterol. If cholesterol cannot be managed through diet alone, you may need to take medicines. Take any prescribed medicines to control your cholesterol as told by your health care provider. Hypertension. To reduce your risk of stroke, try to keep your blood pressure below 130/80. Eating a healthy diet and exercising regularly are important for controlling blood pressure. If these steps are not enough to manage your blood pressure, you may need to take medicines. Take any prescribed medicines to control hypertension as told by your health care provider. Ask your health care provider if you should monitor your blood pressure at home. Have your blood pressure checked every year, even if your blood pressure is normal. Blood pressure increases with age and some medical conditions. Diabetes. Eating a healthy diet and exercising regularly are important parts of managing your blood sugar (glucose). If your blood sugar cannot be managed through diet and exercise, you may need to take medicines. Take any prescribed medicines to control your diabetes as told by your health care provider. Get evaluated for obstructive sleep apnea. Talk to your health care provider about getting a sleep evaluation if you snore a lot or have excessive sleepiness. Make sure that any other medical conditions you have, such as atrial fibrillation or atherosclerosis, are managed. Nutrition Follow instructions from your health care provider about what to eat or drink to help  manage your health condition. These instructions  may include: Reducing your daily calorie intake. Limiting how much salt (sodium) you use to 1,500 milligrams (mg) each day. Using only healthy fats for cooking, such as olive oil, canola oil, or sunflower oil. Eating healthy foods. You can do this by: Choosing foods that are high in fiber, such as whole grains, and fresh fruits and vegetables. Eating at least 5 servings of fruits and vegetables a day. Try to fill one-half of your plate with fruits and vegetables at each meal. Choosing lean protein foods, such as lean cuts of meat, poultry without skin, fish, tofu, beans, and nuts. Eating low-fat dairy products. Avoiding foods that are high in sodium. This can help lower blood pressure. Avoiding foods that have saturated fat, trans fat, and cholesterol. This can help prevent high cholesterol. Avoiding processed and prepared foods. Counting your daily carbohydrate intake.  Lifestyle If you drink alcohol: Limit how much you have to: 0-1 drink a day for women who are not pregnant. 0-2 drinks a day for men. Know how much alcohol is in your drink. In the [place] one drink equals one 12 oz bottle of beer (377mL), one 5 oz glass of wine (121mL), or one 1 oz glass of hard liquor (61mL). Do not use any products that contain nicotine or tobacco. These products include cigarettes, chewing tobacco, and vaping devices, such as e-cigarettes. If you need help quitting, ask your health care provider. Avoid secondhand smoke. Do not use drugs. Activity  Try to stay at a healthy weight. Get at least 30 minutes of exercise on most days, such as: Fast walking. Biking. Swimming. Medicines Take over-the-counter and prescription medicines only as told by your health care provider. Aspirin or blood thinners (antiplatelets or anticoagulants) may be recommended to reduce your risk of forming blood clots that can lead to stroke. Avoid taking birth control pills. Talk to your health care provider about the risks of  taking birth control pills if: You are over 25 years old. You smoke. You get very bad headaches. You have had a blood clot. Where to find more information American Stroke Association: www.strokeassociation.org Get help right away if: You or a loved one has any symptoms of a stroke. "BE FAST" is an easy way to remember the main warning signs of a stroke: B - Balance. Signs are dizziness, sudden trouble walking, or loss of balance. E - Eyes. Signs are trouble seeing or a sudden change in vision. F - Face. Signs are sudden weakness or numbness of the face, or the face or eyelid drooping on one side. A - Arms. Signs are weakness or numbness in an arm. This happens suddenly and usually on one side of the body. S - Speech. Signs are sudden trouble speaking, slurred speech, or trouble understanding what people say. T - Time. Time to call emergency services. Write down what time symptoms started. You or a loved one has other signs of a stroke, such as: A sudden, severe headache with no known cause. Nausea or vomiting. Seizure. These symptoms may represent a serious problem that is an emergency. Do not wait to see if the symptoms will go away. Get medical help right away. Call your local emergency services (911 in the [place] Do not drive yourself to the hospital. Summary You can help to prevent a stroke by eating healthy, exercising, not smoking, limiting alcohol intake, and managing any medical conditions you may have. Do not use any products that contain nicotine or tobacco.  These include cigarettes, chewing tobacco, and vaping devices, such as e-cigarettes. If you need help quitting, ask your health care provider. Remember "BE FAST" for warning signs of a stroke. Get help right away if you or a loved one has any of these signs. This information is not intended to replace advice given to you by your health care provider. Make sure you discuss any questions you have with your health care  provider. Document Revised: 10/04/2019 Document Reviewed: 10/04/2019 Elsevier Patient Education  [place].

## 2021-03-27 NOTE — Progress Notes (Signed)
[facility] [address] [place]. [place] [address] [phone]       OFFICE FOLLOW UP VISIT NOTE  [name] Date of Birth:  1955/11/17 Medical Record Number:  [id number]   Referring MD: Garret Reddish  Reason for Referral: Stroke  HPI: Initial consult 05/04/2020 Sandra Clay is a 66 year old pleasant Caucasian lady seen today for initial office consultation visit for stroke.  History is obtained on the patient, review of electronic medical records and I personally reviewed imaging films in PACS.  Sandra Clay is a pleasant middle-aged Caucasian lady who 2 weeks ago developed sudden onset of vertical double vision.  She states this was binocular.  She felt off balance as if she had had multiple drinks and was leaning to the left.  She complained of nausea but had no vomiting.  She try to walk her dog and had great difficulty doing so because of her double vision and imbalance.  She had recent eye surgery and thought this was related to that and she called her eye doctor who saw her the next day and did the present test and noted that the right eye was slightly hypertrophic.  She had a CT scan of the head on 04/10/2020 which was unremarkable.  Her symptoms improved the next day.  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  She subsequently had outpatient MRI of the neck and brain done on 04/18/2020 both of which did not show any large vessel stenosis or occlusion.  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  She has a 2D echo ordered which is pending for tomorrow.  She is currently doing outpatient occupational therapy but has not gotten any physical therapy but does admit that her balance is off and she has problems with the coordination of left leg.  She was started on aspirin and Plavix and Crestor which she is  tolerating well but does get a lot of bruising on Plavix.  Patient informs me that a week before her onset of her stroke symptoms she had undergone lower extremity vein sclerotherapy procedure.  She denies any prior history of DVT or known history of patent foramen ovale.  There is no prior history of palpitations, syncope, cardiac problems.  There is no family Struve strokes.  Patient does have previous migraine headaches but no seizures or other neurological problems.  She has no significant vascular risk factors except mild hyperlipidemia and LDL cholesterol in June 2021 was 106 mg percent Update 08/29/2020: She returns for follow-up after last visit 4 months ago.  Patient states she is done well she had no recurrent stroke or TIA symptoms.  She had a TCD bubble study on 05/01/2020 which showed a small to medium size PFO.  This was subsequently confirmed on the TEE and found to have atrial septal aneurysm and high risk characteristics.  She saw Dr. Burt Knack who did endovascular PFO closure successfully on 06/30/2020 and procedure went well without any complications.  She had lower extremity venous Doppler on 05/01/2020 which was negative for DVT.  She had lab work at last visit on 04/24/2020 which showed normal hemoglobin A1c, HIV test, RPR and ANA.  Lipid profile showed LDL cholesterol 43 mg percent with triglycerides were slightly elevated at 220 mg percent.  Patient has since been on low-fat presented calorie diet and has been losing weight.  She is also been excising daily.  She plans to have a follow-up lipid profile checked under fasting conditions and if her triglycerides are still elevated she may and another medication to her Crestor 20 mg daily which is tolerating well without any side effects.  Blood pressure well controlled today it is borderline in office at 140/78.  She has no new complaints. Update 03/27/2021 : She returns for follow-up after last visit 6 months ago.  She states she is doing well.  She  has had no recurrent stroke or TIA symptoms.  She has stopped the Plavix and is now on aspirin 81 mg alone which is tolerating well without bruising or bleeding.  She remains on Crestor which she is tolerating well without muscle aches and pains.  Lab work on 01/08/2021 showed hemoglobin A1c of 5.7 and LDL cholesterol 58 mg percent.  She had right knee arthroscopy done in November which went well.  She has had a nutritional coach and is doing regular exercises and is quite active on the treadmill.  She has no complaints today. ROS:   14 system review of systems is positive for bruising,  imbalance, incoordination all other systems negative PMH:  Past Medical History:  Diagnosis Date   Anxiety    Cancer ([place])    skin cancer    Cataract    left forming    Depression    situational , resolved    Headache(784.0)    Osteoarthritis    PONV (postoperative nausea and vomiting)    Stroke ([place]) 03/2020    Social History:  Social History   Socioeconomic History   Marital status: Single    Spouse name: Not on file   Number of children: Not on file   Years of education: Not on file   Highest education level: Not on file  Occupational History   Occupation: Full time  Tobacco Use   Smoking status: Former   Smokeless tobacco: Never  Substance and Sexual Activity   Alcohol use: Yes    Alcohol/week: 1.0 - 2.0 standard drink    Types: 1 - 2 Glasses of wine per week    Comment: one to two glasses of wine per day    Drug use: No   Sexual activity: Yes    Birth control/protection: Post-menopausal  Other Topics Concern   Not on file  Social History Narrative   Lives with significant other, Carter.   Right Handed   Drinks 2-3 cups caffeine daily   Social Determinants of Health   Financial Resource Strain: Not on file  Food Insecurity: Not on file  Transportation Needs: Not on file  Physical Activity: Not on file  Stress: Not on file  Social Connections: Not on file  Intimate Partner  Violence: Not on file    Medications:   Current Outpatient Medications on File Prior to Visit  Medication Sig Dispense Refill   aspirin EC 81 MG tablet Take 81 mg by mouth in the morning. Swallow whole.     doxylamine, Sleep, (UNISOM) 25 MG tablet Take 25 mg by mouth at bedtime.     rosuvastatin (CRESTOR) 20 MG tablet TAKE 1 TABLET BY MOUTH ONCE A DAY (Patient taking differently: Take 20 mg by mouth in the morning.) 90 tablet 3   valACYclovir (VALTREX) 1000 MG tablet TAKE 1 TABLET BY MOUTH ONCE DAILY 90 tablet 3   No current facility-administered medications on file prior to visit.    Allergies:   Allergies  Allergen Reactions  Dilaudid [Hydromorphone] Itching   Sulfa Antibiotics Rash    Physical Exam General: well developed, well nourished middle-aged Caucasian lady, seated, in no evident distress Head: head normocephalic and atraumatic.   Neck: supple with no carotid or supraclavicular bruits Cardiovascular: regular rate and rhythm, no murmurs Musculoskeletal: no deformity Skin:  no rash but bruising noted in the left forearm Vascular:  Normal pulses all extremities  Neurologic Exam Mental Status: Awake and fully alert. Oriented to place and time. Recent and remote memory intact. Attention span, concentration and fund of knowledge appropriate. Mood and affect appropriate.  Cranial Nerves: Fundoscopic exam not done. Pupils equal, briskly reactive to light. Extraocular movements full without nystagmus. Visual fields full to confrontation. Hearing intact. Facial sensation intact. Face, tongue, palate moves normally and symmetrically.  Motor: Normal bulk and tone. Normal strength in all tested extremity muscles mild diminished fine finger movements on the left.  Orbits right over left upper extremity.  Mild left grip weakness.. Sensory.: intact to touch , pinprick , position and vibratory sensation.  Coordination: Rapid alternating movements normal in all extremities. Finger-to-nose  and heel-to-shin performed accurately bilaterally. Gait and Station: Arises from chair without difficulty. Stance is normal. Gait demonstrates normal stride length and balance . Able to heel, toe and tandem walk with moderate difficulty.  Reflexes: 1+ and symmetric. Toes downgoing.      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  Vascular risk factors of mild hyperlipidemia , PFO and age only.     PLAN: I had a long discussion with the patient with regards to remote stroke and PFO closure and answered questions.  Continue aspirin for stroke prevention and maintain aggressive risk factor modification with strict control of hypertension with blood pressure goal below 140/90, lipids with LDL cholesterol goal below 70 mg percent and diabetes with hemoglobin A1c goal below 6.5%.  She was encouraged to eat a healthy diet with lots of fruits, vegetables, cereals, whole grains and to be active and exercise regularly and lose weight.  Check transcranial Doppler bubble study to assess adequacy of PFO closure.  Return for follow-up in the future in 1 year or call earlier if necessary.Greater than 50% time during this 35-minute  visit was spent on counseling and coordination of care about her brainstem infarct and diplopia and PFO closure and discussion about stroke prevention and treatment and answering questions. Antony Contras, MD  [facility] [address] [place], [place] [address]  Phone [phone] Fax [phone] Note: This document was prepared with digital dictation and possible smart phrase technology. Any transcriptional errors that result from this process are unintentional.

## 2021-04-02 ENCOUNTER — Telehealth: Payer: Self-pay | Admitting: Neurology

## 2021-04-02 NOTE — Telephone Encounter (Signed)
[facility] no Roxanna Mew, sent Butch Penny a a message she will reach out to the patient to schedule.

## 2021-04-07 ENCOUNTER — Other Ambulatory Visit (HOSPITAL_COMMUNITY): Payer: Self-pay

## 2021-05-10 ENCOUNTER — Other Ambulatory Visit (HOSPITAL_COMMUNITY): Payer: Self-pay

## 2021-05-10 DIAGNOSIS — N760 Acute vaginitis: Secondary | ICD-10-CM | POA: Diagnosis not present

## 2021-05-10 DIAGNOSIS — N390 Urinary tract infection, site not specified: Secondary | ICD-10-CM | POA: Diagnosis not present

## 2021-05-10 MED ORDER — FLUCONAZOLE 150 MG PO TABS
ORAL_TABLET | ORAL | 2 refills | Status: DC
  Filled 2021-05-10: qty 3, 6d supply, fill #0

## 2021-05-12 DIAGNOSIS — N3000 Acute cystitis without hematuria: Secondary | ICD-10-CM | POA: Diagnosis not present

## 2021-05-30 ENCOUNTER — Ambulatory Visit (HOSPITAL_COMMUNITY): Payer: 59

## 2021-06-07 DIAGNOSIS — Q2112 Patent foramen ovale: Secondary | ICD-10-CM

## 2021-06-07 DIAGNOSIS — Z8673 Personal history of transient ischemic attack (TIA), and cerebral infarction without residual deficits: Secondary | ICD-10-CM

## 2021-06-07 DIAGNOSIS — Z8774 Personal history of (corrected) congenital malformations of heart and circulatory system: Secondary | ICD-10-CM

## 2021-06-20 ENCOUNTER — Other Ambulatory Visit (HOSPITAL_COMMUNITY): Payer: Self-pay

## 2021-06-20 ENCOUNTER — Other Ambulatory Visit: Payer: Self-pay | Admitting: Family Medicine

## 2021-06-20 MED ORDER — ROSUVASTATIN CALCIUM 20 MG PO TABS
ORAL_TABLET | Freq: Every day | ORAL | 3 refills | Status: DC
  Filled 2021-06-20: qty 90, 90d supply, fill #0
  Filled 2021-09-30: qty 90, 90d supply, fill #1
  Filled 2022-01-04: qty 90, 90d supply, fill #2
  Filled 2022-04-17: qty 90, 90d supply, fill #3

## 2021-06-26 NOTE — Progress Notes (Signed)
?[facility]   ?[place] ?                                    ?Cardiology Office Note:   ? ?Date:  06/27/2021  ? ?ID:  Sandra Clay, DOB [birth year], MRN [id number] ? ?PCP:  Sandra Barrack, MD  ?[facility] Cardiologist:  Sandra Mocha, MD  ? ?Referring MD: Sandra Barrack, MD  ? ?Chief Complaint  ?Patient presents with  ? Follow-up  ?  S/p 1 year PFO   ? ?History of Present Illness:   ? ?Sandra Clay is a 66 y.o. female with a hx of cryptogenic stroke and PFO s/p PFO closure (06/30/20) who presents for 1 year follow up. ?  ?[name] developed sudden onset of double vision in 03/2020 which was associated with poor balance and a feeling that she was leaning to the left. There was also associated nausea but no vomiting. A CT scan of the head showed no significant abnormality. MRI scan demonstrated a small right superior ventral midbrain infarct. MRA of the brain subsequently showed no significant large vessel atherosclerotic disease. A 2D echocardiogram showed no abnormalities. Lower extremity venous duplex was negative for DVT. Transcranial Doppler ultrasound study showed Spencer grade 4 right to left shunt consistent with PFO. Subsequent 30 day cardiac monitor was negative for afib. TEE on 05/19/20 showed an anatomically higher risk PFO with immediate R-->L shunt and atrial septal aneurysm.  ? ?She was evaluated by Dr. Burt Knack and felt to be a good candidate for PFO closure. She underwent successful transcatheter PFO closure using an 18 mm Amplatzer PFO occluder device on 06/30/20. Post op echo showed EF 60-65% with normal PFO occluder placement with no significant shunting by color flow doppler. She was discharged on aspirin and plavix x 6 months.  ?  ?In follow up she was doing very well with no complaints. She continues to do very well today on my evaluation. Echo today shows stable device placement with no shunting and negative bubble. She denies CP and SOB. No LE edema,  orthopnea or PND. No dizziness or syncope. No blood in stool or urine. No palpitations. No new neuro changes.  ? ?Past Medical History:  ?Diagnosis Date  ? Anxiety   ? Cancer [facility])   ? skin cancer   ? Cataract   ? left forming   ? Depression   ? situational , resolved   ? Headache(784.0)   ? Osteoarthritis   ? PONV (postoperative nausea and vomiting)   ? Stroke [facility]) 03/2020  ? ? ?Past Surgical History:  ?Procedure Laterality Date  ? BUBBLE STUDY  05/19/2020  ? Procedure: BUBBLE STUDY;  Surgeon: Sanda Klein, MD;  Location: [place];  Service: Cardiovascular;;  ? BUNIONECTOMY    ? CESAREAN SECTION    ? x3  ? CHOLECYSTECTOMY    ? COLONOSCOPY    ? ~51 ish per pt  bucini  ? FRACTURE SURGERY    ? KNEE ARTHROSCOPY W/ MENISCAL REPAIR Right   ? x2  ? PATENT FORAMEN OVALE(PFO) CLOSURE N/A 06/30/2020  ? Procedure: PATENT FORAMEN OVALE (PFO) CLOSURE;  Surgeon: Sandra Mocha, MD;  Location: [place] [facility];  Service: Cardiovascular;  Laterality: N/A;  ? ROTATOR CUFF REPAIR  2018  ? right   ? TEE WITHOUT CARDIOVERSION N/A 05/19/2020  ? Procedure: TRANSESOPHAGEAL ECHOCARDIOGRAM (TEE);  Surgeon: Sanda Klein,  MD;  Location: [place];  Service: Cardiovascular;  Laterality: N/A;  ? TOTAL HIP ARTHROPLASTY Left 08/03/2018  ? Procedure: TOTAL HIP ARTHROPLASTY ANTERIOR APPROACH;  Surgeon: Gaynelle Arabian, MD;  Location: [facility];  Service: Orthopedics;  Laterality: Left;  ? TUBAL LIGATION    ? tubal reversal    ? ? ?Current Medications: ?Current Meds  ?Medication Sig  ? aspirin EC 81 MG tablet Take 81 mg by mouth in the morning. Swallow whole.  ? doxylamine, Sleep, (UNISOM) 25 MG tablet Take 25 mg by mouth at bedtime.  ? rosuvastatin (CRESTOR) 20 MG tablet TAKE 1 TABLET BY MOUTH ONCE A DAY  ? valACYclovir (VALTREX) 1000 MG tablet TAKE 1 TABLET BY MOUTH ONCE DAILY  ?  ? ?Allergies:   Dilaudid [hydromorphone] and Sulfa antibiotics  ? ?Social History  ? ?Socioeconomic History  ? Marital status: Single  ?  Spouse name: Not on  file  ? Number of children: Not on file  ? Years of education: Not on file  ? Highest education level: Not on file  ?Occupational History  ? Occupation: Full time  ?Tobacco Use  ? Smoking status: Former  ? Smokeless tobacco: Never  ?Substance and Sexual Activity  ? Alcohol use: Yes  ?  Alcohol/week: 1.0 - 2.0 standard drink  ?  Types: 1 - 2 Glasses of wine per week  ?  Comment: one to two glasses of wine per day   ? Drug use: No  ? Sexual activity: Yes  ?  Birth control/protection: Post-menopausal  ?Other Topics Concern  ? Not on file  ?Social History Narrative  ? Lives with significant other, Eulas Post.  ? Right Handed  ? Drinks 2-3 cups caffeine daily  ? ?Social Determinants of Health  ? ?Financial Resource Strain: Not on file  ?Food Insecurity: Not on file  ?Transportation Needs: Not on file  ?Physical Activity: Not on file  ?Stress: Not on file  ?Social Connections: Not on file  ?  ? ?Family History: ?The patient's family history includes Alcohol abuse in her brother, father, mother, and sister; Arthritis in her father; Breast cancer in her maternal aunt and mother; Cancer in her brother and father; Colon cancer (age of onset: 69) in her sister; Depression in her brother and sister; Diabetes in her father; Drug abuse in her brother; Early death in her brother; Hearing loss in her brother and father. There is no history of Colon polyps, Esophageal cancer, Rectal cancer, or Stomach cancer. ? ?ROS:   ?Please see the history of present illness.    ?All other systems reviewed and are negative. ? ?EKGs/Labs/Other Studies Reviewed:   ? ?The following studies were reviewed today: ? ?Echo with bubble 06/27/21: ? ? 1. Left ventricular ejection fraction, by estimation, is 60 to 65%. The  ?left ventricle has normal function. The left ventricle has no regional  ?wall motion abnormalities. Left ventricular diastolic parameters are  ?consistent with Grade I diastolic  ?dysfunction (impaired relaxation).  ? 2. Right ventricular  systolic function is normal. The right ventricular  ?size is normal.  ? 3. Left atrial size was mildly dilated.  ? 4. A well-seated Amplatzer occluder is seen. there is no residual shunt  ?by Doppler or saline contrast injection. Agitated saline contrast bubble  ?study was negative, with no evidence of any interatrial shunt.  ? 5. The mitral valve is normal in structure. Mild mitral valve  ?regurgitation.  ? 6. The aortic valve is normal in structure. Aortic valve regurgitation is  ?  not visualized. Aortic valve sclerosis is present, with no evidence of  ?aortic valve stenosis.  ? ?06/30/2020 ?PATENT FORAMEN OVALE (PFO) CLOSURE  ?Conclusion ?Successful transcatheter PFO closure using an 18 mm Amplatzer PFO occluder device ?  ?Recommend: ?Same day DC protocol ?ASA/clopidogrel x 6 months if tolerated ?SBE prophylaxis x 6 months ?  ?____________________ ?  ?Echo limited 06/30/2020 ?IMPRESSIONS  ? 1. Left ventricular ejection fraction, by estimation, is 60 to 65%. The  ?left ventricle has normal function.  ? 2. The inferior vena cava is normal in size with greater than 50%  ?respiratory variability, suggesting right atrial pressure of 3 mmHg.  ? 3. S/P PFO occluder placement. No significant shunting seen by Color  ?doppler.  ? ?EKG:  EKG is not ordered today.   ? ?Recent Labs: ?01/08/2021: ALT 45; BUN 13; Creatinine, Ser 0.84; Hemoglobin 13.9; Platelets 213.0; Potassium 4.7; Sodium 142; TSH 2.63  ?Recent Lipid Panel ?   ?Component Value Date/Time  ? CHOL 145 01/08/2021 0825  ? CHOL 145 04/24/2020 1236  ? TRIG 42.0 01/08/2021 0825  ? HDL 78.60 01/08/2021 0825  ? HDL 68 04/24/2020 1236  ? CHOLHDL 2 01/08/2021 0825  ? VLDL 8.4 01/08/2021 0825  ? [place] 58 01/08/2021 0825  ? LDLCALC 43 04/24/2020 1236  ? ?Physical Exam:   ? ?VS:  BP (!) 149/76   Pulse 69   Ht '5\' 6"'$  (1.676 m)   Wt 173 lb (78.5 kg)   SpO2 96%   BMI 27.92 kg/m?    ? ?Wt Readings from Last 3 Encounters:  ?06/27/21 173 lb (78.5 kg)  ?03/27/21 168 lb (76.2 kg)   ?01/10/21 165 lb 5.5 oz (75 kg)  ?  ?General: Well developed, well nourished, NAD ?Cardiovascular: RRR with S1 S2. No murmurs, rubs, gallops, or LV heave appreciated. ?Extremities: No edema. ?Neuro: Alert and orien

## 2021-06-27 ENCOUNTER — Ambulatory Visit (HOSPITAL_COMMUNITY): Payer: 59 | Attending: Cardiovascular Disease

## 2021-06-27 ENCOUNTER — Ambulatory Visit: Payer: 59 | Admitting: Cardiology

## 2021-06-27 VITALS — BP 149/76 | HR 69 | Ht 66.0 in | Wt 173.0 lb

## 2021-06-27 DIAGNOSIS — Z8673 Personal history of transient ischemic attack (TIA), and cerebral infarction without residual deficits: Secondary | ICD-10-CM | POA: Diagnosis not present

## 2021-06-27 DIAGNOSIS — E785 Hyperlipidemia, unspecified: Secondary | ICD-10-CM | POA: Diagnosis not present

## 2021-06-27 DIAGNOSIS — Z8774 Personal history of (corrected) congenital malformations of heart and circulatory system: Secondary | ICD-10-CM | POA: Insufficient documentation

## 2021-06-27 DIAGNOSIS — Q2112 Patent foramen ovale: Secondary | ICD-10-CM | POA: Insufficient documentation

## 2021-06-27 LAB — ECHOCARDIOGRAM LIMITED BUBBLE STUDY
Area-P 1/2: 3.3 cm2
S' Lateral: 3 cm

## 2021-06-27 NOTE — Patient Instructions (Signed)
Medication Instructions:  ?Your physician recommends that you continue on your current medications as directed. Please refer to the Current Medication list given to you today.  ?*If you need a refill on your cardiac medications before your next appointment, please call your pharmacy* ? ? ?Lab Work: ?NONE ?If you have labs (blood work) drawn today and your tests are completely normal, you will receive your results only by: ?MyChart Message (if you have MyChart) OR ?A paper copy in the mail ?If you have any lab test that is abnormal or we need to change your treatment, we will call you to review the results. ? ? ?Testing/Procedures: ?NONE ? ? ?Follow-Up: ?At [facility], you and your health needs are our priority.  As part of our continuing mission to provide you with exceptional heart care, we have created designated Provider Care Teams.  These Care Teams include your primary Cardiologist (physician) and Advanced Practice Providers (APPs -  Physician Assistants and Nurse Practitioners) who all work together to provide you with the care you need, when you need it. ? ?We recommend signing up for the patient portal called "MyChart".  Sign up information is provided on this After Visit Summary.  MyChart is used to connect with patients for Virtual Visits (Telemedicine).  Patients are able to view lab/test results, encounter notes, upcoming appointments, etc.  Non-urgent messages can be sent to your provider as well.   ?To learn more about what you can do with MyChart, go to [email or web address].   ? ?Important Information About Sugar ? ? ? ? ?  ?

## 2021-07-09 ENCOUNTER — Other Ambulatory Visit (HOSPITAL_COMMUNITY): Payer: Self-pay

## 2021-07-16 ENCOUNTER — Other Ambulatory Visit (HOSPITAL_COMMUNITY): Payer: Self-pay | Admitting: Obstetrics and Gynecology

## 2021-07-16 DIAGNOSIS — Z1231 Encounter for screening mammogram for malignant neoplasm of breast: Secondary | ICD-10-CM

## 2021-07-19 ENCOUNTER — Ambulatory Visit (HOSPITAL_COMMUNITY)
Admission: RE | Admit: 2021-07-19 | Discharge: 2021-07-19 | Disposition: A | Discharge status: Home / Self Care | Payer: 59 | Source: Ambulatory Visit | Attending: Obstetrics and Gynecology | Admitting: Obstetrics and Gynecology

## 2021-07-19 DIAGNOSIS — Z1231 Encounter for screening mammogram for malignant neoplasm of breast: Secondary | ICD-10-CM | POA: Diagnosis not present

## 2021-10-01 ENCOUNTER — Other Ambulatory Visit (HOSPITAL_COMMUNITY): Payer: Self-pay

## 2021-10-12 ENCOUNTER — Other Ambulatory Visit (HOSPITAL_COMMUNITY): Payer: Self-pay

## 2021-10-23 DIAGNOSIS — L821 Other seborrheic keratosis: Secondary | ICD-10-CM | POA: Diagnosis not present

## 2021-10-23 DIAGNOSIS — D1801 Hemangioma of skin and subcutaneous tissue: Secondary | ICD-10-CM | POA: Diagnosis not present

## 2021-10-23 DIAGNOSIS — L814 Other melanin hyperpigmentation: Secondary | ICD-10-CM | POA: Diagnosis not present

## 2021-10-23 DIAGNOSIS — I8392 Asymptomatic varicose veins of left lower extremity: Secondary | ICD-10-CM | POA: Diagnosis not present

## 2021-10-23 DIAGNOSIS — I8391 Asymptomatic varicose veins of right lower extremity: Secondary | ICD-10-CM | POA: Diagnosis not present

## 2021-10-31 ENCOUNTER — Other Ambulatory Visit: Payer: Self-pay | Admitting: Obstetrics and Gynecology

## 2021-10-31 DIAGNOSIS — Z803 Family history of malignant neoplasm of breast: Secondary | ICD-10-CM

## 2021-11-26 ENCOUNTER — Other Ambulatory Visit: Payer: Self-pay | Admitting: *Deleted

## 2021-11-26 DIAGNOSIS — M79606 Pain in leg, unspecified: Secondary | ICD-10-CM

## 2021-12-06 NOTE — Progress Notes (Signed)
Requested by:  Vivi Barrack, MD [address] [place],  [place] [address]  Reason for consultation: spider veins ble   History of Present Illness   Sandra Clay is a 66 y.o. (Feb 17, 1956) female who presents for evaluation of spider veins in both lower extremities particularly around her ankles. She has had multiple venous interventions in the past including sclerotherapy and ablations at [facility]. However she has had increase in appearance and darkening from the veins around her ankles. She had seen her Dermatologist who recommended vascular follow up. She is not having any pain, tiredness, throbbing, burning, itching, swelling or bleeding. She does not elevate or wear compression stockings. She worked as a Therapist, sports for 30 years. No hx of DVT  Venous symptoms include: spider veins Onset/duration:  many years  Occupation:  RN Aggravating factors: none Alleviating factors: none Compression:  no Helps:  no Pain medications:  no Previous vein procedures:  no History of DVT: no  Past Medical History:  Diagnosis Date   Anxiety    Cancer ([place])    skin cancer    Cataract    left forming    Depression    situational , resolved    Headache(784.0)    Osteoarthritis    PONV (postoperative nausea and vomiting)    Stroke ([place]) 03/2020    Past Surgical History:  Procedure Laterality Date   BUBBLE STUDY  05/19/2020   Procedure: BUBBLE STUDY;  Surgeon: Sanda Klein, MD;  Location: [facility];  Service: Cardiovascular;;   BUNIONECTOMY     CESAREAN SECTION     x3   CHOLECYSTECTOMY     COLONOSCOPY     ~51 ish per pt  bucini   FRACTURE SURGERY     KNEE ARTHROSCOPY W/ MENISCAL REPAIR Right    x2   PATENT FORAMEN OVALE(PFO) CLOSURE N/A 06/30/2020   Procedure: PATENT FORAMEN OVALE (PFO) CLOSURE;  Surgeon: Sherren Mocha, MD;  Location: [facility];  Service: Cardiovascular;  Laterality: N/A;   ROTATOR CUFF REPAIR  2018   right    TEE WITHOUT CARDIOVERSION N/A  05/19/2020   Procedure: TRANSESOPHAGEAL ECHOCARDIOGRAM (TEE);  Surgeon: Sanda Klein, MD;  Location: [facility];  Service: Cardiovascular;  Laterality: N/A;   TOTAL HIP ARTHROPLASTY Left 08/03/2018   Procedure: TOTAL HIP ARTHROPLASTY ANTERIOR APPROACH;  Surgeon: Gaynelle Arabian, MD;  Location: [facility];  Service: Orthopedics;  Laterality: Left;   TUBAL LIGATION     tubal reversal      Social History   Socioeconomic History   Marital status: Single    Spouse name: Not on file   Number of children: Not on file   Years of education: Not on file   Highest education level: Not on file  Occupational History   Occupation: Full time  Tobacco Use   Smoking status: Former   Smokeless tobacco: Never  Substance and Sexual Activity   Alcohol use: Yes    Alcohol/week: 1.0 - 2.0 standard drink of alcohol    Types: 1 - 2 Glasses of wine per week    Comment: one to two glasses of wine per day    Drug use: No   Sexual activity: Yes    Birth control/protection: Post-menopausal  Other Topics Concern   Not on file  Social History Narrative   Lives with significant other, Carter.   Right Handed   Drinks 2-3 cups caffeine daily   Social Determinants of Health   Financial Resource Strain: Not  on file  Food Insecurity: Not on file  Transportation Needs: Not on file  Physical Activity: Not on file  Stress: Not on file  Social Connections: Not on file  Intimate Partner Violence: Not on file    Family History  Problem Relation Age of Onset   Alcohol abuse Mother    Breast cancer Mother    Alcohol abuse Father    Arthritis Father    Cancer Father    Diabetes Father    Hearing loss Father    Colon cancer Sister 39   Alcohol abuse Brother    Cancer Brother    Depression Brother    Drug abuse Brother    Early death Brother    Hearing loss Brother    Depression Sister    Alcohol abuse Sister    Breast cancer Maternal Aunt    Colon polyps Neg Hx    Esophageal cancer Neg Hx    Rectal  cancer Neg Hx    Stomach cancer Neg Hx     Current Outpatient Medications  Medication Sig Dispense Refill   aspirin EC 81 MG tablet Take 81 mg by mouth in the morning. Swallow whole.     doxylamine, Sleep, (UNISOM) 25 MG tablet Take 25 mg by mouth at bedtime.     rosuvastatin (CRESTOR) 20 MG tablet TAKE 1 TABLET BY MOUTH ONCE A DAY 90 tablet 3   valACYclovir (VALTREX) 1000 MG tablet TAKE 1 TABLET BY MOUTH ONCE DAILY 90 tablet 3   No current facility-administered medications for this visit.    Allergies  Allergen Reactions   Dilaudid [Hydromorphone] Itching   Sulfa Antibiotics Rash    REVIEW OF SYSTEMS (negative unless checked):   Cardiac:  '[]'$  Chest pain or chest pressure? '[]'$  Shortness of breath upon activity? '[]'$  Shortness of breath when lying flat? '[]'$  Irregular heart rhythm?  Vascular:  '[]'$  Pain in calf, thigh, or hip brought on by walking? '[]'$  Pain in feet at night that wakes you up from your sleep? '[]'$  Blood clot in your veins? '[]'$  Leg swelling?  Pulmonary:  '[]'$  Oxygen at home? '[]'$  Productive cough? '[]'$  Wheezing?  Neurologic:  '[]'$  Sudden weakness in arms or legs? '[]'$  Sudden numbness in arms or legs? '[]'$  Sudden onset of difficult speaking or slurred speech? '[]'$  Temporary loss of vision in one eye? '[]'$  Problems with dizziness?  Gastrointestinal:  '[]'$  Blood in stool? '[]'$  Vomited blood?  Genitourinary:  '[]'$  Burning when urinating? '[]'$  Blood in urine?  Psychiatric:  '[]'$  Major depression  Hematologic:  '[]'$  Bleeding problems? '[]'$  Problems with blood clotting?  Dermatologic:  '[]'$  Rashes or ulcers?  Constitutional:  '[]'$  Fever or chills?  Ear/Nose/Throat:  '[]'$  Change in hearing? '[]'$  Nose bleeds? '[]'$  Sore throat?  Musculoskeletal:  '[]'$  Back pain? '[]'$  Joint pain? '[]'$  Muscle pain?   Physical Examination     Vitals:   12/07/21 1238  BP: (!) 149/84  Pulse: 78  Resp: 16  Temp: 98.4 F (36.9 C)  TempSrc: Temporal  SpO2: 99%  Weight: 172 lb (78 kg)  Height: '5\' 6"'$   (1.676 m)   Body mass index is 27.76 kg/m.  General:  WDWN in NAD; vital signs documented above Gait: Normal HENT: WNL, normocephalic Pulmonary: normal non-labored breathing  Cardiac: regular HR Vascular Exam/Pulses:2+ Dp and PT pulses bilaterally, feet warm and well perfused Extremities: without varicose veins, with several reticular veins and multiple spider veins around bilateral ankles, without edema, without stasis pigmentation, without lipodermatosclerosis, without ulcers Musculoskeletal: no muscle  wasting or atrophy  Neurologic: A&O X 3;  No focal weakness or paresthesias are detected Psychiatric:  The pt has Normal affect.  Non-invasive Vascular Imaging   BLE Venous Insufficiency Duplex (12/07/21):  RLE:  No DVT and SVT GSV reflux at [facility] and proximal thigh GSV diameter  .20 cm  No SSV reflux CFV deep venous reflux   Medical Decision Making   Sandra Clay is a 66 y.o. female who presents with: RLE chronic venous insufficiency with spider veins. She has minimal symptoms in BLE. She has undergone multiple venous interventions in the past. Her duplex today shows very minimal residual reflux in the GSV and CFV. No DVT or SVT. No SSV reflux. Based on the study today she is not a candidate for any invasive venous intervention. He spider veins also are too small for sclerotherapy. I provided reassurance that she is not at risk for losing her legs due to this. I have encouraged her to elevate her legs daily above level of her heart and also start wearing 15-20 mm HG knee high compression stockings. She was measured and received pair at today's visit. She will follow up as needed if she has any new or concerning symptoms    Karoline Caldwell, PA-C [facility]: [phone]  12/07/2021, 1:13 PM  On call MD:  Stanford Breed

## 2021-12-07 ENCOUNTER — Ambulatory Visit: Payer: 59 | Admitting: Physician Assistant

## 2021-12-07 ENCOUNTER — Encounter: Payer: Self-pay | Admitting: Physician Assistant

## 2021-12-07 ENCOUNTER — Ambulatory Visit (HOSPITAL_COMMUNITY)
Admission: RE | Admit: 2021-12-07 | Discharge: 2021-12-07 | Disposition: A | Discharge status: Home / Self Care | Payer: 59 | Source: Ambulatory Visit | Attending: Vascular Surgery | Admitting: Vascular Surgery

## 2021-12-07 VITALS — BP 149/84 | HR 78 | Temp 98.4°F | Resp 16 | Ht 66.0 in | Wt 172.0 lb

## 2021-12-07 DIAGNOSIS — I83893 Varicose veins of bilateral lower extremities with other complications: Secondary | ICD-10-CM

## 2021-12-07 DIAGNOSIS — M79606 Pain in leg, unspecified: Secondary | ICD-10-CM | POA: Diagnosis not present

## 2021-12-10 ENCOUNTER — Encounter: Payer: Self-pay | Admitting: *Deleted

## 2021-12-20 ENCOUNTER — Other Ambulatory Visit (HOSPITAL_COMMUNITY): Payer: Self-pay

## 2022-01-01 DIAGNOSIS — M25561 Pain in right knee: Secondary | ICD-10-CM | POA: Diagnosis not present

## 2022-01-03 DIAGNOSIS — M25561 Pain in right knee: Secondary | ICD-10-CM | POA: Diagnosis not present

## 2022-01-04 ENCOUNTER — Other Ambulatory Visit (HOSPITAL_COMMUNITY): Payer: Self-pay

## 2022-01-10 DIAGNOSIS — M25561 Pain in right knee: Secondary | ICD-10-CM | POA: Diagnosis not present

## 2022-01-14 DIAGNOSIS — Z01419 Encounter for gynecological examination (general) (routine) without abnormal findings: Secondary | ICD-10-CM | POA: Diagnosis not present

## 2022-01-14 DIAGNOSIS — Z6828 Body mass index (BMI) 28.0-28.9, adult: Secondary | ICD-10-CM | POA: Diagnosis not present

## 2022-01-14 DIAGNOSIS — Z13820 Encounter for screening for osteoporosis: Secondary | ICD-10-CM | POA: Diagnosis not present

## 2022-01-14 LAB — HM DEXA SCAN

## 2022-01-15 ENCOUNTER — Other Ambulatory Visit (HOSPITAL_COMMUNITY): Payer: Self-pay

## 2022-01-15 MED ORDER — ESTRADIOL 0.1 MG/GM VA CREA
0.5000 g | TOPICAL_CREAM | VAGINAL | 4 refills | Status: AC
  Filled 2022-01-15: qty 42.5, 70d supply, fill #0

## 2022-01-15 MED ORDER — VALACYCLOVIR HCL 1 G PO TABS
1000.0000 mg | ORAL_TABLET | Freq: Every day | ORAL | 4 refills | Status: DC
  Filled 2022-01-15: qty 90, 90d supply, fill #0
  Filled 2022-04-27: qty 90, 90d supply, fill #1
  Filled 2022-07-30: qty 90, 90d supply, fill #2
  Filled 2022-11-07: qty 90, 90d supply, fill #3

## 2022-01-31 DIAGNOSIS — M25561 Pain in right knee: Secondary | ICD-10-CM | POA: Diagnosis not present

## 2022-02-10 DIAGNOSIS — M25569 Pain in unspecified knee: Secondary | ICD-10-CM | POA: Diagnosis not present

## 2022-02-14 DIAGNOSIS — M25569 Pain in unspecified knee: Secondary | ICD-10-CM | POA: Diagnosis not present

## 2022-02-28 ENCOUNTER — Encounter: Payer: Self-pay | Admitting: *Deleted

## 2022-03-01 DIAGNOSIS — M1711 Unilateral primary osteoarthritis, right knee: Secondary | ICD-10-CM | POA: Diagnosis not present

## 2022-03-01 DIAGNOSIS — M25561 Pain in right knee: Secondary | ICD-10-CM | POA: Diagnosis not present

## 2022-03-14 ENCOUNTER — Other Ambulatory Visit: Payer: 59

## 2022-03-25 ENCOUNTER — Ambulatory Visit
Admission: RE | Admit: 2022-03-25 | Discharge: 2022-03-25 | Disposition: A | Discharge status: Home / Self Care | Payer: Commercial Managed Care - PPO | Source: Ambulatory Visit | Attending: Obstetrics and Gynecology | Admitting: Obstetrics and Gynecology

## 2022-03-25 DIAGNOSIS — Z803 Family history of malignant neoplasm of breast: Secondary | ICD-10-CM

## 2022-03-25 DIAGNOSIS — Z1239 Encounter for other screening for malignant neoplasm of breast: Secondary | ICD-10-CM | POA: Diagnosis not present

## 2022-03-25 MED ORDER — GADOPICLENOL 0.5 MMOL/ML IV SOLN
7.0000 mL | Freq: Once | INTRAVENOUS | Status: AC | PRN
  Administered 2022-03-25: 7 mL via INTRAVENOUS

## 2022-03-26 IMAGING — MR MR MRA NECK WO/W CM
7 of 8 series · 41 of 48 positions shown · IV contrast (Contrast agent)
Comparison: Previous MRI from 04/12/2020.

CLINICAL DATA: Follow-up examination for acute stroke.

EXAM:
MRA NECK WITHOUT AND WITH CONTRAST
MRA HEAD WITHOUT CONTRAST
TECHNIQUE: Multiplanar and multiecho pulse sequences of the neck were obtained
without and with intravenous contrast. Angiographic images of the
neck were obtained using MRA technique without and with intravenous
contrast; Angiographic images of the Circle of Willis were obtained
using MRA technique without intravenous contrast.
CONTRAST:  7mL GADAVIST GADOBUTROL 1 MMOL/ML IV SOLN

[Series 10: tof_fl3d_tra_iso · axial · 0.6mm · 0.52mm/px · z∈[-191,-112]mm · 6 of 133 slices shown]
[im 1/133]
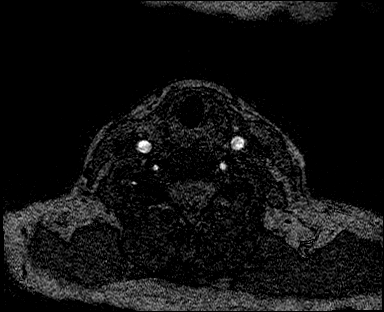
[im 27/133]
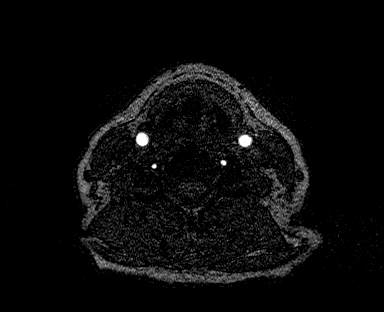
[im 53/133]
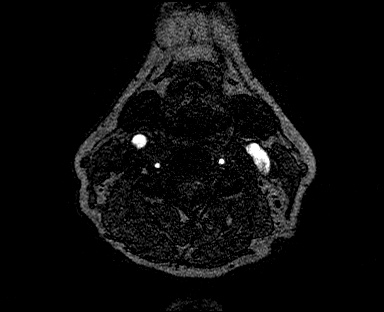
[im 80/133]
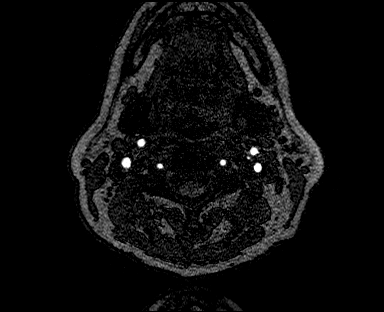
[im 106/133]
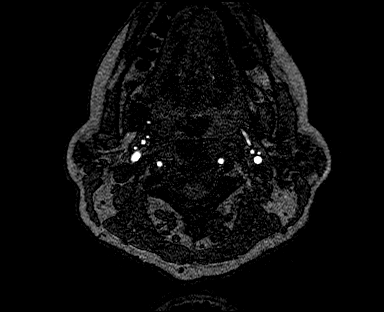
[im 133/133]
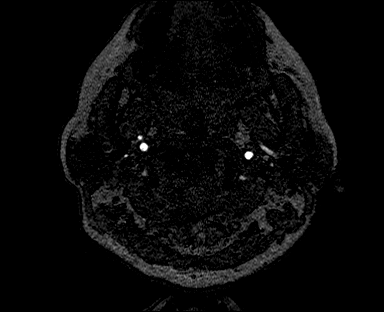

[Series 13: angio_fl3d_cor_pre_ttc=3.0s · coronal · 0.9mm · 0.85mm/px · 6 of 104 slices shown]
[im 1/104]
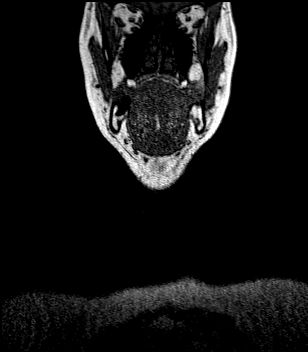
[im 21/104]
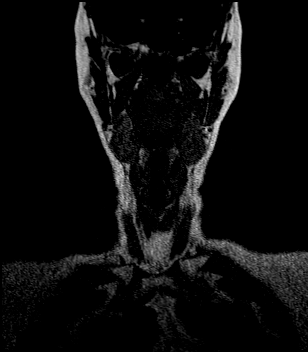
[im 42/104]
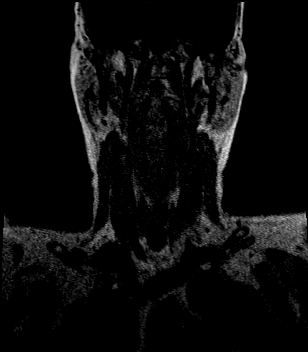
[im 62/104]
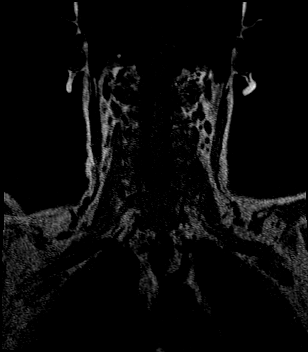
[im 83/104]
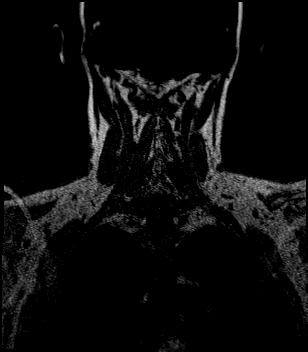
[im 104/104]
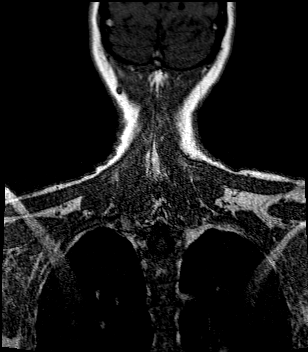

[Series 15: angio_fl3d_cor_post_ttc=3.0s · coronal · 0.9mm · 0.85mm/px · 6 of 104 slices shown (1 of 2)]
[im 1/104]
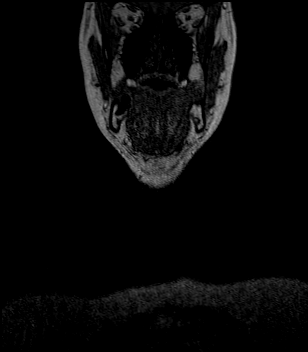
[im 21/104]
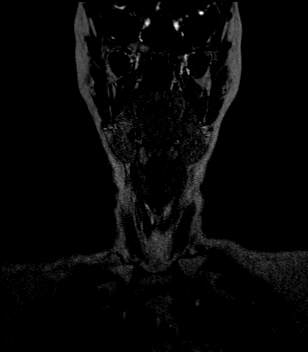
[im 42/104]
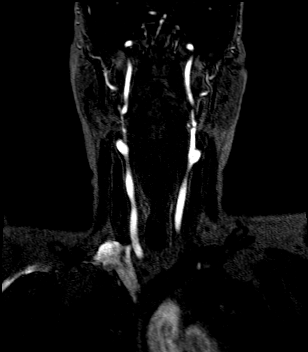
[im 62/104]
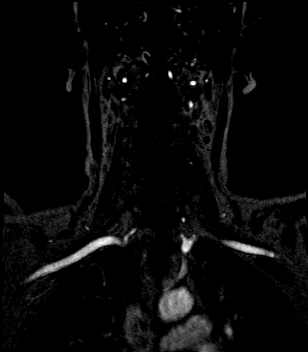
[im 83/104]
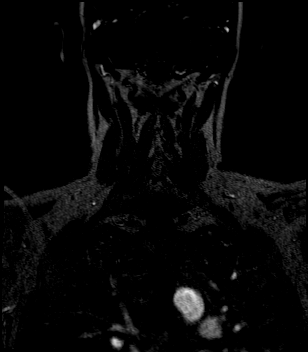
[im 104/104]
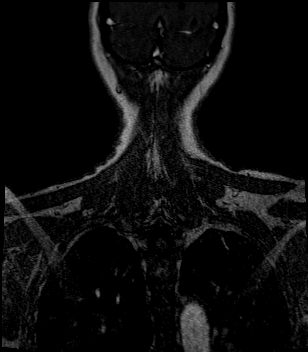

[Series 16: angio_fl3d_cor_post_ttc=3.0s_moco-adv · coronal · 0.9mm · 0.85mm/px · 6 of 104 slices shown (1 of 2)]
[im 1/104]
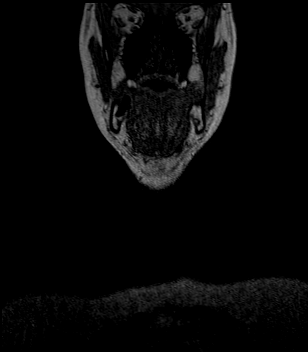
[im 21/104]
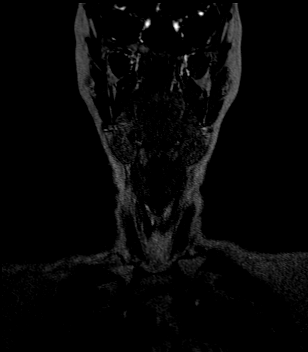
[im 42/104]
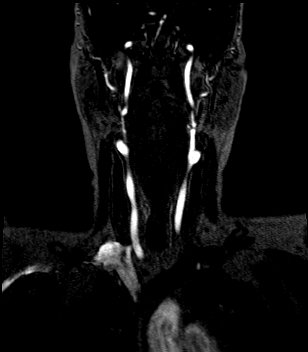
[im 62/104]
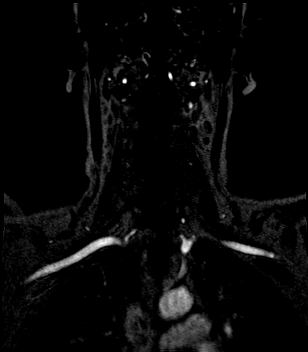
[im 83/104]
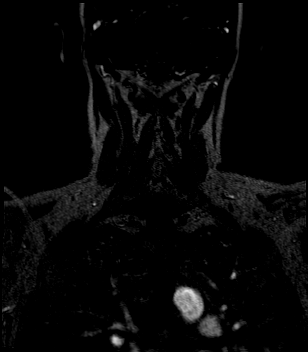
[im 104/104]
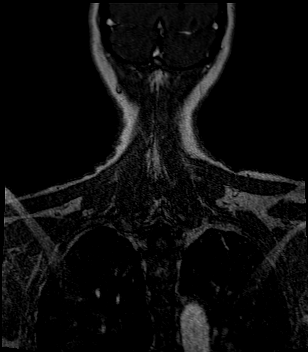

[Series 17: angio_fl3d_cor_post_ttc=3.0s_moco-adv_sub · coronal · 0.9mm · 0.85mm/px · 6 of 103 slices shown]
[im 1/103]
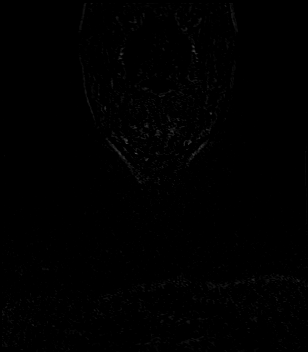
[im 21/103]
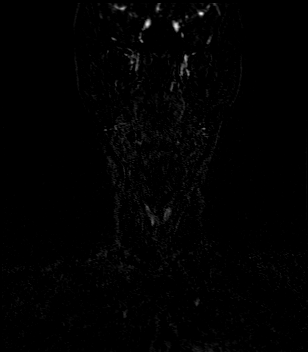
[im 41/103]
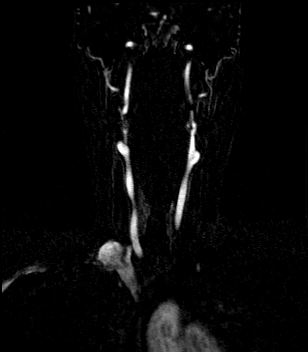
[im 62/103]
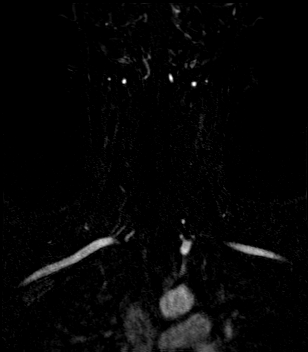
[im 82/103]
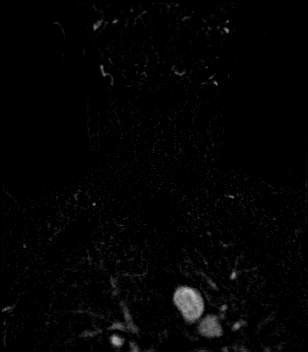
[im 103/103]
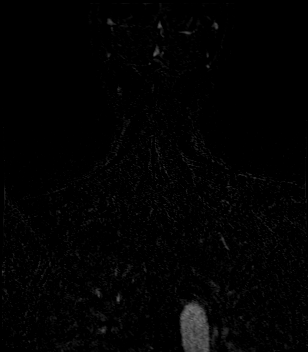

[Series 19: angio_fl3d_cor_post_ttc=3.0s · coronal · 0.9mm · 0.85mm/px · 6 of 104 slices shown (2 of 2)]
[im 1/104]
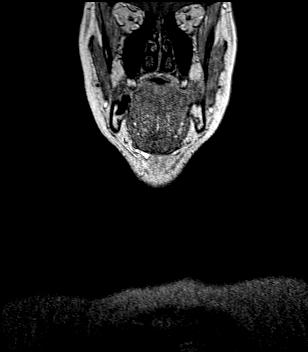
[im 21/104]
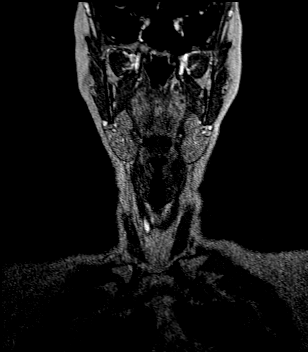
[im 42/104]
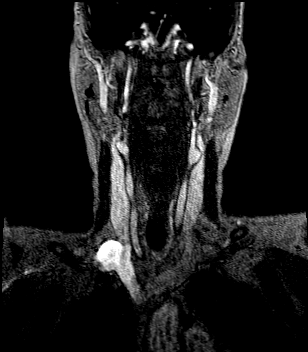
[im 62/104]
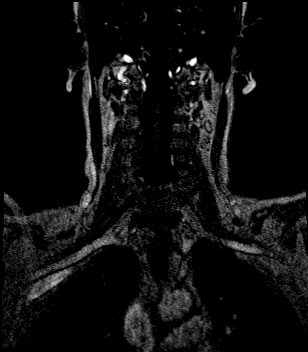
[im 83/104]
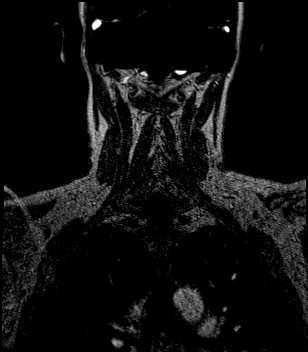
[im 104/104]
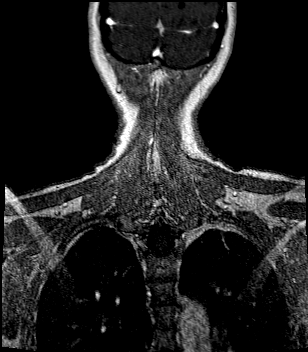

[Series 20: angio_fl3d_cor_post_ttc=3.0s_moco-adv · coronal · 0.9mm · 0.85mm/px · 5 of 104 slices shown (2 of 2)]
[im 1/104]
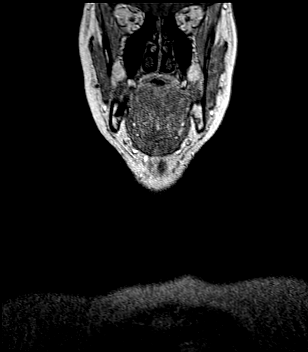
[im 21/104]
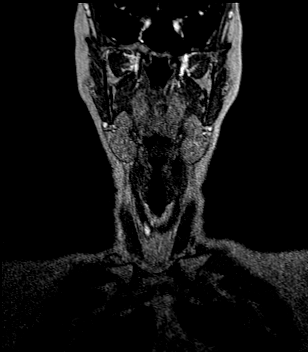
[im 42/104]
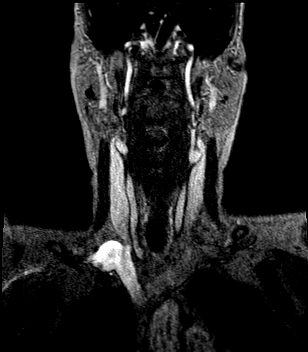
[im 62/104]
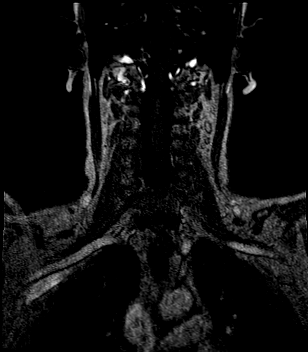
[im 83/104]
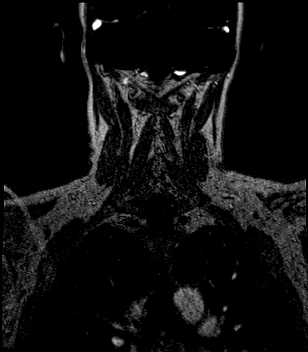

[41 of 48 positions shown; findings below may reference images not displayed]

FINDINGS: MRA NECK FINDINGS

AORTIC ARCH: Visualized aortic arch normal in caliber. Bovine
branching pattern with common origin of the left common and
brachiocephalic arteries. No hemodynamically significant stenosis
about the origin of the great vessels. Visualized subclavian
arteries widely patent.

RIGHT CAROTID SYSTEM: Right common and internal carotid arteries
widely patent without stenosis, evidence for dissection or
occlusion. No significant atheromatous narrowing or irregularity
about the right bifurcation.

LEFT CAROTID SYSTEM: Left common and internal carotid arteries
widely patent without stenosis, evidence for dissection or
occlusion. No significant atheromatous narrowing or irregularity
about the left bifurcation.

VERTEBRAL ARTERIES: Both vertebral arteries arise from the
subclavian arteries. No proximal subclavian artery stenosis. Left
vertebral artery slightly dominant. Vertebral arteries widely patent
within the neck without stenosis, evidence for dissection or
occlusion.

MRA HEAD FINDINGS

ANTERIOR CIRCULATION:

Visualized distal cervical segments of the internal carotid arteries
are patent with symmetric antegrade flow. Petrous, cavernous, and
supraclinoid segments patent without hemodynamically significant
stenosis. A1 segments widely patent bilaterally. Normal anterior
communicating artery complex. Anterior cerebral arteries patent to
their distal aspects without stenosis. No M1 stenosis or occlusion.
Normal MCA bifurcations. Distal MCA branches well perfused and
symmetric. Distal small vessel atheromatous irregularity noted.

POSTERIOR CIRCULATION:

Dominant left V4 segment widely patent to the vertebrobasilar
junction. Left PICA origin patent and normal. Hypoplastic right
vertebral artery largely terminates in PICA, although a small branch
ascending towards the vertebrobasilar junction. Right PICA patent as
well. Basilar diminutive but widely patent to its distal aspect.
Superior cerebellar arteries patent bilaterally. Fetal type origin
of the left PCA. Right PCA supplied via a hypoplastic right P1
segment and robust right posterior communicating artery. Both PCAs
well perfused to their distal aspects.

No intracranial aneurysm.
IMPRESSION: 1. Negative MRA of the head and neck. No large vessel occlusion or
hemodynamically significant stenosis.
2. Mild distal small vessel atheromatous irregularity within the
intracranial circulation.
3. Predominant fetal type origin of the PCAs with overall diminutive
vertebrobasilar system.

## 2022-03-26 IMAGING — MR MR MRA HEAD W/O CM
1 series · 18 of 48 positions shown · IV contrast (gadavist)
Comparison: Previous MRI from 04/12/2020.

CLINICAL DATA: Follow-up examination for acute stroke.

EXAM:
MRA NECK WITHOUT AND WITH CONTRAST
MRA HEAD WITHOUT CONTRAST
TECHNIQUE: Multiplanar and multiecho pulse sequences of the neck were obtained
without and with intravenous contrast. Angiographic images of the
neck were obtained using MRA technique without and with intravenous
contrast; Angiographic images of the Circle of Willis were obtained
using MRA technique without intravenous contrast.
CONTRAST:  7mL GADAVIST GADOBUTROL 1 MMOL/ML IV SOLN

[Series 5: 3d cow · axial · 0.5mm · 0.41mm/px · z∈[-89,-8]mm · 18 of 172 slices shown]
[im 1/172]
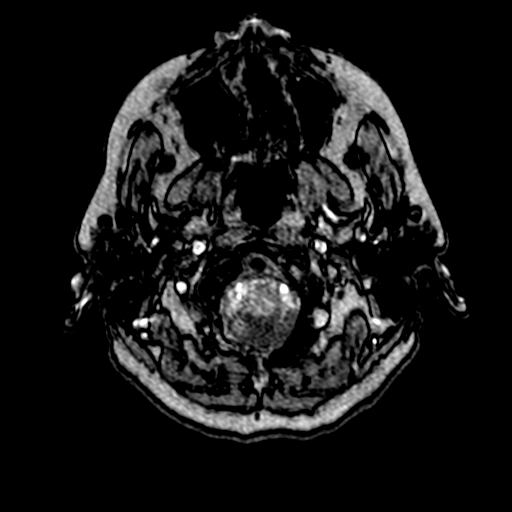
[im 4/172]
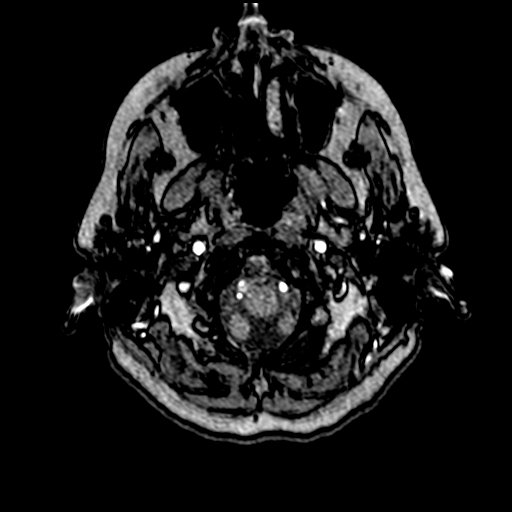
[im 8/172]
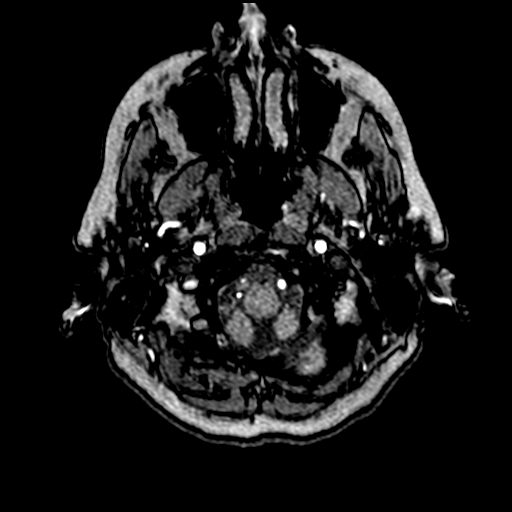
[im 11/172]
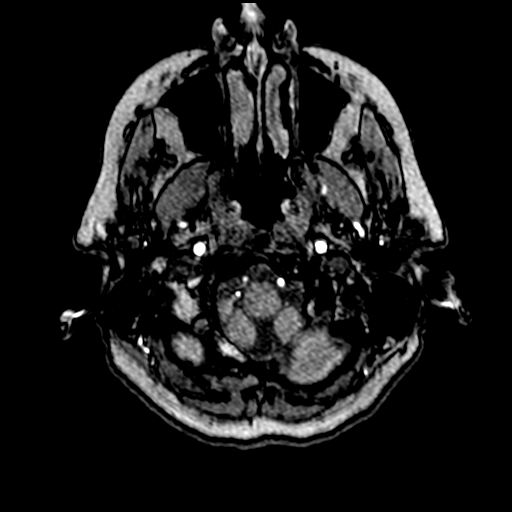
[im 15/172]
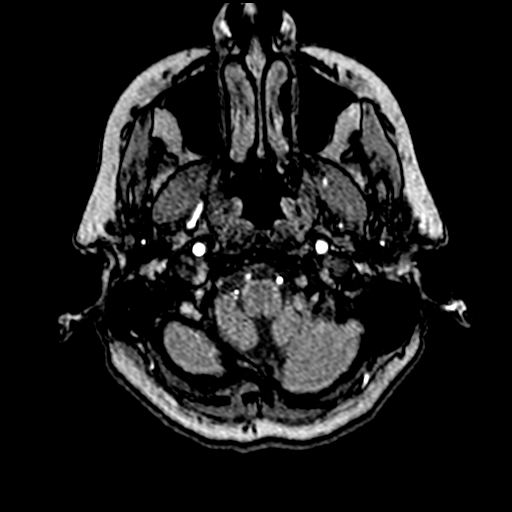
[im 19/172]
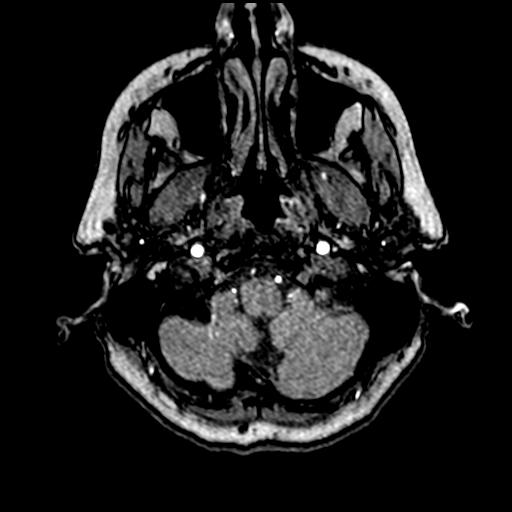
[im 22/172]
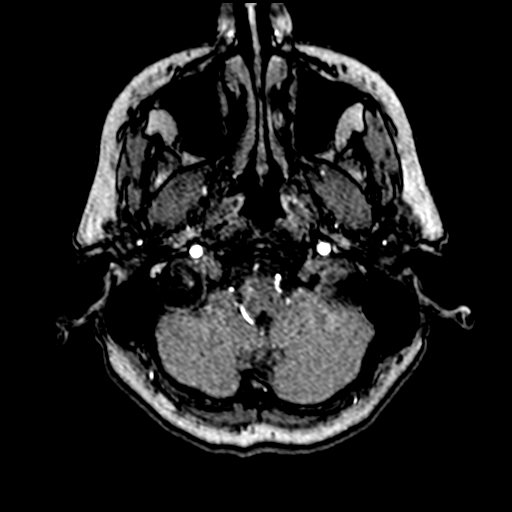
[im 26/172]
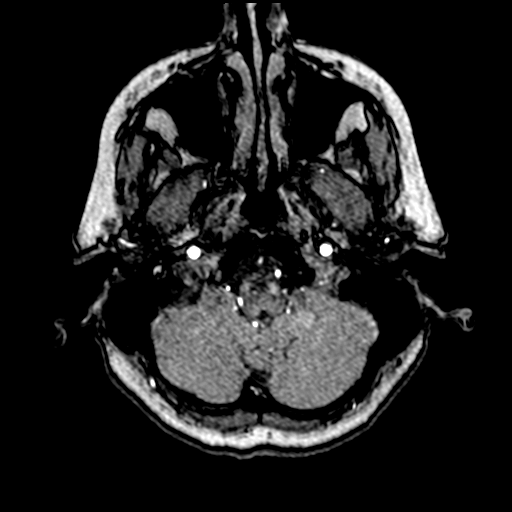
[im 30/172]
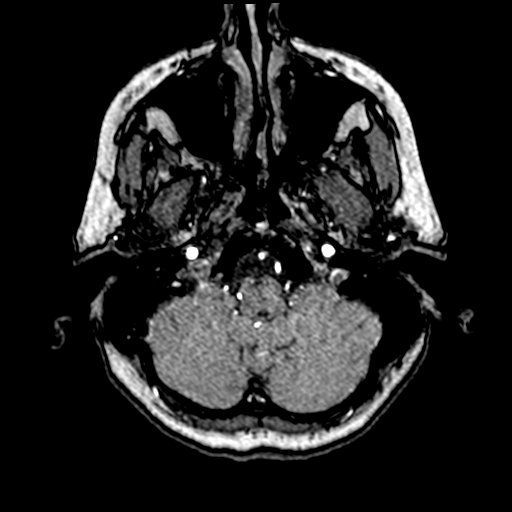
[im 33/172]
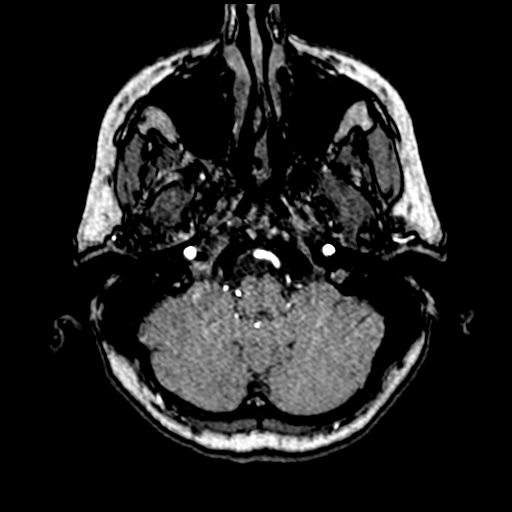
[im 55/172]
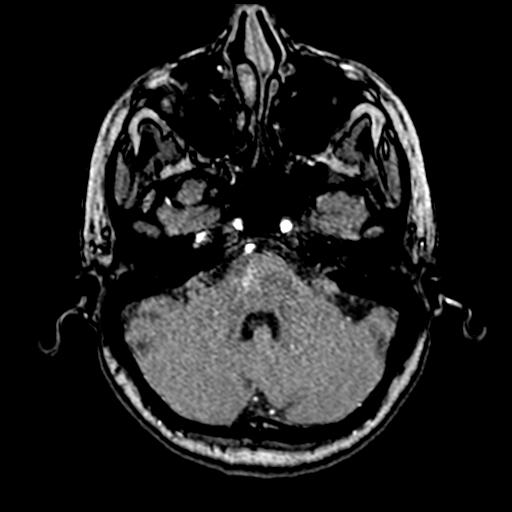
[im 77/172]
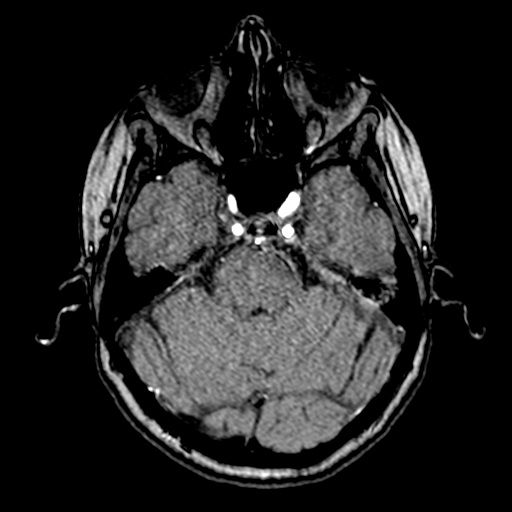
[im 88/172]
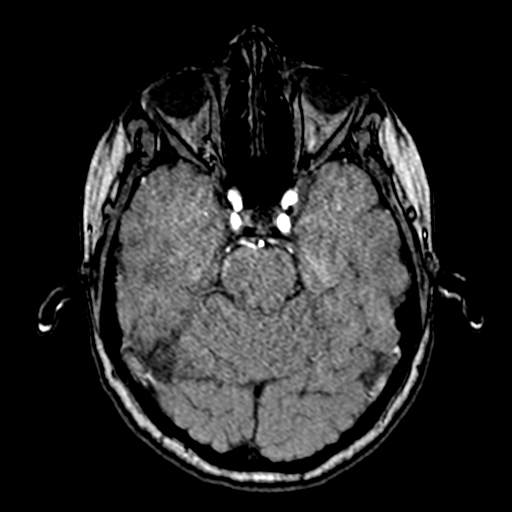
[im 99/172]
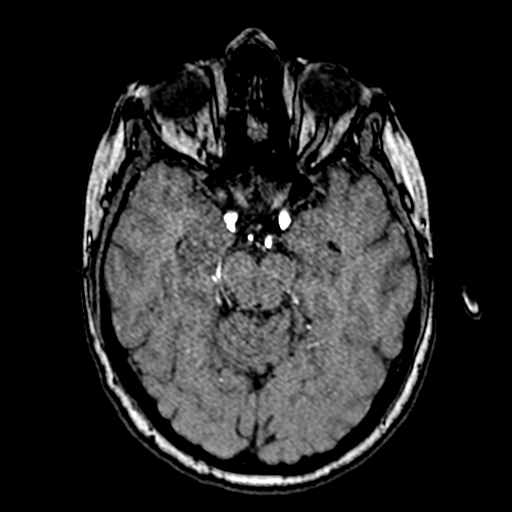
[im 121/172]
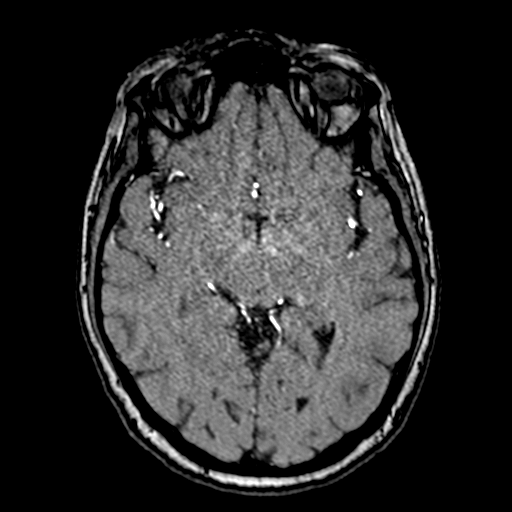
[im 142/172]
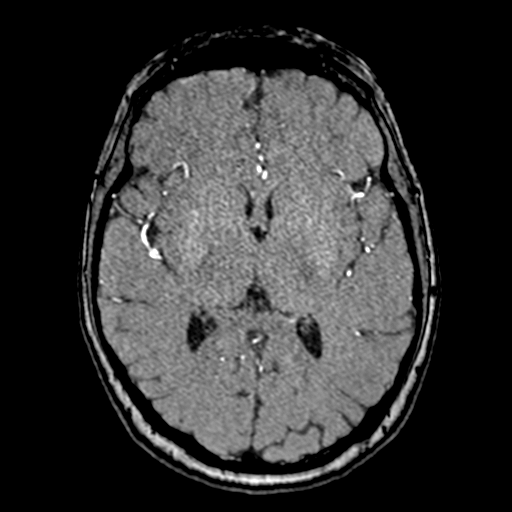
[im 146/172]
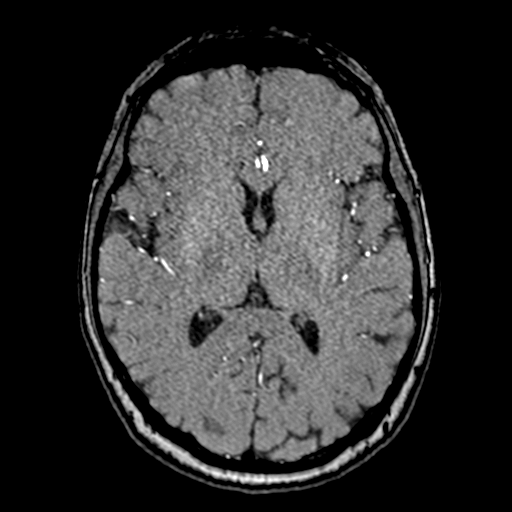
[im 164/172]
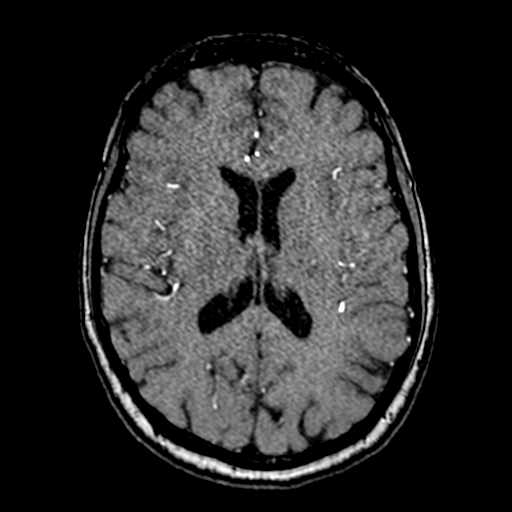

[18 of 48 positions shown; findings below may reference images not displayed]

FINDINGS: MRA NECK FINDINGS

AORTIC ARCH: Visualized aortic arch normal in caliber. Bovine
branching pattern with common origin of the left common and
brachiocephalic arteries. No hemodynamically significant stenosis
about the origin of the great vessels. Visualized subclavian
arteries widely patent.

RIGHT CAROTID SYSTEM: Right common and internal carotid arteries
widely patent without stenosis, evidence for dissection or
occlusion. No significant atheromatous narrowing or irregularity
about the right bifurcation.

LEFT CAROTID SYSTEM: Left common and internal carotid arteries
widely patent without stenosis, evidence for dissection or
occlusion. No significant atheromatous narrowing or irregularity
about the left bifurcation.

VERTEBRAL ARTERIES: Both vertebral arteries arise from the
subclavian arteries. No proximal subclavian artery stenosis. Left
vertebral artery slightly dominant. Vertebral arteries widely patent
within the neck without stenosis, evidence for dissection or
occlusion.

MRA HEAD FINDINGS

ANTERIOR CIRCULATION:

Visualized distal cervical segments of the internal carotid arteries
are patent with symmetric antegrade flow. Petrous, cavernous, and
supraclinoid segments patent without hemodynamically significant
stenosis. A1 segments widely patent bilaterally. Normal anterior
communicating artery complex. Anterior cerebral arteries patent to
their distal aspects without stenosis. No M1 stenosis or occlusion.
Normal MCA bifurcations. Distal MCA branches well perfused and
symmetric. Distal small vessel atheromatous irregularity noted.

POSTERIOR CIRCULATION:

Dominant left V4 segment widely patent to the vertebrobasilar
junction. Left PICA origin patent and normal. Hypoplastic right
vertebral artery largely terminates in PICA, although a small branch
ascending towards the vertebrobasilar junction. Right PICA patent as
well. Basilar diminutive but widely patent to its distal aspect.
Superior cerebellar arteries patent bilaterally. Fetal type origin
of the left PCA. Right PCA supplied via a hypoplastic right P1
segment and robust right posterior communicating artery. Both PCAs
well perfused to their distal aspects.

No intracranial aneurysm.
IMPRESSION: 1. Negative MRA of the head and neck. No large vessel occlusion or
hemodynamically significant stenosis.
2. Mild distal small vessel atheromatous irregularity within the
intracranial circulation.
3. Predominant fetal type origin of the PCAs with overall diminutive
vertebrobasilar system.

## 2022-03-27 ENCOUNTER — Ambulatory Visit: Payer: 59 | Admitting: Neurology

## 2022-04-03 DIAGNOSIS — Z0100 Encounter for examination of eyes and vision without abnormal findings: Secondary | ICD-10-CM | POA: Diagnosis not present

## 2022-04-27 ENCOUNTER — Other Ambulatory Visit (HOSPITAL_COMMUNITY): Payer: Self-pay

## 2022-05-04 ENCOUNTER — Other Ambulatory Visit (HOSPITAL_COMMUNITY): Payer: Self-pay

## 2022-05-17 ENCOUNTER — Encounter: Payer: Commercial Managed Care - PPO | Admitting: Family Medicine

## 2022-05-17 ENCOUNTER — Ambulatory Visit: Payer: Commercial Managed Care - PPO | Admitting: Family Medicine

## 2022-05-21 ENCOUNTER — Encounter: Payer: Self-pay | Admitting: Neurology

## 2022-05-21 ENCOUNTER — Ambulatory Visit: Payer: Commercial Managed Care - PPO | Admitting: Neurology

## 2022-05-21 ENCOUNTER — Telehealth: Payer: Self-pay | Admitting: Neurology

## 2022-05-21 VITALS — BP 144/79 | HR 78 | Ht 66.0 in | Wt 170.8 lb

## 2022-05-21 DIAGNOSIS — Q2112 Patent foramen ovale: Secondary | ICD-10-CM

## 2022-05-21 DIAGNOSIS — Z8673 Personal history of transient ischemic attack (TIA), and cerebral infarction without residual deficits: Secondary | ICD-10-CM | POA: Diagnosis not present

## 2022-05-21 NOTE — Patient Instructions (Signed)
I had a long discussion with the patient with regards to remote stroke and PFO closure and answered questions.  Continue aspirin for stroke prevention and maintain aggressive risk factor modification with strict control of hypertension with blood pressure goal below 140/90, lipids with LDL cholesterol goal below 70 mg percent and diabetes with hemoglobin A1c goal below 6.5%.  She was encouraged to eat a healthy diet with lots of fruits, vegetables, cereals, whole grains and to be active and exercise regularly and lose weight.    Return for follow-up in the future only  if necessary

## 2022-05-21 NOTE — Progress Notes (Signed)
[facility] [address] [place]. [place] [address] [phone]       OFFICE FOLLOW UP VISIT NOTE  Sandra Clay Date of Birth:  [birth year] Medical Record Number:  [id number]   Referring MD: Garret Reddish  Reason for Referral: Stroke  HPI: Initial consult 05/04/2020 Sandra Clay is a 67 year old pleasant Caucasian lady seen today for initial office consultation visit for stroke.  History is obtained on the patient, review of electronic medical records and I personally reviewed imaging films in PACS.  Sandra Clay is a pleasant middle-aged Caucasian lady who 2 weeks ago developed sudden onset of vertical double vision.  She states this was binocular.  She felt off balance as if she had had multiple drinks and was leaning to the left.  She complained of nausea but had no vomiting.  She try to walk her dog and had great difficulty doing so because of her double vision and imbalance.  She had recent eye surgery and thought this was related to that and she called her eye doctor who saw her the next day and did the present test and noted that the right eye was slightly hypertrophic.  She had a CT scan of the head on 04/10/2020 which was unremarkable.  Her symptoms improved the next day.  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  She subsequently had outpatient MRI of the neck and brain done on 04/18/2020 both of which did not show any large vessel stenosis or occlusion.  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  She has a 2D echo ordered which is pending for tomorrow.  She is currently doing outpatient occupational therapy but has not gotten any physical therapy but does admit that her balance is off and she has problems with the coordination of left leg.  She was started on aspirin and Plavix and Crestor which she is  tolerating well but does get a lot of bruising on Plavix.  Patient informs me that a week before her onset of her stroke symptoms she had undergone lower extremity vein sclerotherapy procedure.  She denies any prior history of DVT or known history of patent foramen ovale.  There is no prior history of palpitations, syncope, cardiac problems.  There is no family Struve strokes.  Patient does have previous migraine headaches but no seizures or other neurological problems.  She has no significant vascular risk factors except mild hyperlipidemia and LDL cholesterol in June 2021 was 106 mg percent Update 08/29/2020: She returns for follow-up after last visit 4 months ago.  Patient states she is done well she had no recurrent stroke or TIA symptoms.  She had a TCD bubble study on 05/01/2020 which showed a small to medium size PFO.  This was subsequently confirmed on the TEE and found to have atrial septal aneurysm and high risk characteristics.  She saw Dr. Burt Knack who did endovascular PFO closure successfully on 06/30/2020 and procedure went well without any complications.  She had lower extremity venous Doppler on 05/01/2020 which was negative for DVT.  She had lab work at last visit on 04/24/2020 which showed normal hemoglobin A1c, HIV test, RPR and ANA.  Lipid profile showed LDL cholesterol 43 mg percent with triglycerides were slightly elevated at 220 mg percent.  Patient has since been on low-fat presented calorie diet and has been losing weight.  She is also been excising daily.  She plans to have a follow-up lipid profile checked under fasting conditions and if her triglycerides are still elevated she may and another medication to her Crestor 20 mg daily which is tolerating well without any side effects.  Blood pressure well controlled today it is borderline in office at 140/78.  She has no new complaints. Update 03/27/2021 : She returns for follow-up after last visit 6 months ago.  She states she is doing well.  She  has had no recurrent stroke or TIA symptoms.  She has stopped the Plavix and is now on aspirin 81 mg alone which is tolerating well without bruising or bleeding.  She remains on Crestor which she is tolerating well without muscle aches and pains.  Lab work on 01/08/2021 showed hemoglobin A1c of 5.7 and LDL cholesterol 58 mg percent.  She had right knee arthroscopy done in November which went well.  She has had a nutritional coach and is doing regular exercises and is quite active on the treadmill.  She has no complaints today. Update 05/21/2022 : She returns for follow-up after last visit.  More than a year ago.  She states she is doing well.  She has not had any recurrent stroke or TIA symptoms.  Remains on aspirin which is tolerating well without bruising or bleeding.  Tolerating Crestor well without muscle aches and pains.  She has an upcoming appointment to see primary care physician next week and will have follow-up lab work done.  He states his blood pressure is usually under good control so it is slightly elevated in office today at 144/79.  She is eating healthy and has lost weight.  She is quite active and is doing dancing 3 days a week as well as goes to the gym regularly.  She has no new complaints today. ROS:   14 system review of systems is positive for bruising,  imbalance, incoordination all other systems negative PMH:  Past Medical History:  Diagnosis Date   Anxiety    Cancer ([place])    skin cancer    Cataract    left forming    Depression    situational , resolved    Headache(784.0)    Osteoarthritis    PONV (postoperative nausea and vomiting)    Stroke ([place]) 03/2020    Social History:  Social History   Socioeconomic History   Marital status: Single    Spouse name: Not on file   Number of children: Not on file   Years of education: Not on file   Highest education level: Not on file  Occupational History   Occupation: Full time  Tobacco Use   Smoking status: Former    Smokeless tobacco: Never  Substance and Sexual Activity   Alcohol use: Yes    Alcohol/week: 1.0 - 2.0 standard drink of alcohol    Types: 1 - 2 Glasses of wine per week    Comment: one to two glasses of wine per day    Drug use: No   Sexual activity: Yes    Birth control/protection: Post-menopausal  Other Topics Concern   Not on file  Social History Narrative   Lives with significant other, Sandra [place].   Right Handed   Drinks 2-3 cups caffeine daily   Social Determinants of Health   Financial Resource Strain: Not on file  Food Insecurity: Not on file  Transportation Needs: Not on file  Physical Activity: Not on file  Stress: Not on file  Social Connections: Not on file  Intimate Partner Violence: Not on file    Medications:   Current Outpatient Medications on File Prior to Visit  Medication Sig Dispense Refill   aspirin EC 81 MG tablet Take 81 mg by mouth in the morning. Swallow whole.     estradiol (ESTRACE) 0.1 MG/GM vaginal cream Place 0.5 grams vaginally 2 times a week at bedtime. 42.5 g 4   rosuvastatin (CRESTOR) 20 MG tablet TAKE 1 TABLET BY MOUTH ONCE A DAY 90 tablet 3   valACYclovir (VALTREX) 1000 MG tablet Take 1 tablet (1,000 mg total) by mouth daily. 90 tablet 4   No current facility-administered medications on file prior to visit.    Allergies:   Allergies  Allergen Reactions   Dilaudid [Hydromorphone] Itching   Sulfa Antibiotics Rash    Physical Exam General: well developed, well nourished middle-aged Caucasian lady, seated, in no evident distress Head: head normocephalic and atraumatic.   Neck: supple with no carotid or supraclavicular bruits Cardiovascular: regular rate and rhythm, no murmurs Musculoskeletal: no deformity Skin:  no rash but bruising noted in the left forearm Vascular:  Normal pulses all extremities  Neurologic Exam Mental Status: Awake and fully alert. Oriented to place and time. Recent and remote memory intact. Attention span,  concentration and fund of knowledge appropriate. Mood and affect appropriate.  Cranial Nerves: Fundoscopic exam not done. Pupils equal, briskly reactive to light. Extraocular movements full without nystagmus. Visual fields full to confrontation. Hearing intact. Facial sensation intact. Face, tongue, palate moves normally and symmetrically.  Motor: Normal bulk and tone. Normal strength in all tested extremity muscles mild diminished fine finger movements on the left.  Orbits right over left upper extremity.  Mild left grip weakness.. Sensory.: intact to touch , pinprick , position and vibratory sensation.  Coordination: Rapid alternating movements normal in all extremities. Finger-to-nose and heel-to-shin performed accurately bilaterally. Gait and Station: Arises from chair without difficulty. Stance is normal. Gait demonstrates normal stride length and balance . Able to heel, toe and tandem walk with mild difficulty.  Reflexes: 1+ and symmetric. Toes downgoing.      ASSESSMENT: 67 year old Caucasian lady with transient diplopia and gait ataxia secondary to right midbrain infarct in January 2022 likely from small vessel disease.  Vascular risk factors of mild hyperlipidemia , PFO and age only.     PLAN: I had a long discussion with the patient with regards to remote stroke and PFO closure and answered questions.  Continue aspirin for stroke prevention and maintain aggressive risk factor modification with strict control of hypertension with blood pressure goal below 140/90, lipids with LDL cholesterol goal below 70 mg percent and diabetes with hemoglobin A1c goal below 6.5%.  She was encouraged to eat a healthy diet with lots of fruits, vegetables, cereals, whole grains and to be active and exercise regularly and lose weight.    Return for follow-up in the future only  if necessary.Greater than 50% time during this 35-minute  visit was spent on counseling and coordination of care about her brainstem  infarct and diplopia and PFO closure and discussion about stroke prevention and treatment and answering questions. Antony Contras, MD  [facility] [address] [place], [place] [address]  Phone [phone] Fax [phone] Note: This document was prepared with digital dictation and possible smart phrase technology. Any transcriptional errors that result from this process are unintentional.

## 2022-05-28 ENCOUNTER — Encounter: Payer: Commercial Managed Care - PPO | Admitting: Family Medicine

## 2022-06-06 DIAGNOSIS — M1711 Unilateral primary osteoarthritis, right knee: Secondary | ICD-10-CM | POA: Diagnosis not present

## 2022-06-07 ENCOUNTER — Ambulatory Visit (INDEPENDENT_AMBULATORY_CARE_PROVIDER_SITE_OTHER): Payer: Commercial Managed Care - PPO | Admitting: Family Medicine

## 2022-06-07 ENCOUNTER — Encounter: Payer: Self-pay | Admitting: Family Medicine

## 2022-06-07 VITALS — BP 128/78 | HR 66 | Temp 98.0°F | Ht 66.0 in | Wt 169.4 lb

## 2022-06-07 DIAGNOSIS — Z0001 Encounter for general adult medical examination with abnormal findings: Secondary | ICD-10-CM

## 2022-06-07 DIAGNOSIS — I639 Cerebral infarction, unspecified: Secondary | ICD-10-CM | POA: Diagnosis not present

## 2022-06-07 DIAGNOSIS — R739 Hyperglycemia, unspecified: Secondary | ICD-10-CM

## 2022-06-07 NOTE — Assessment & Plan Note (Signed)
Recently saw neurology..  Follow-up as needed.  No residual deficits.  Continue statin and aspirin.  Will need to continue with aggressive risk factor management.  Check labs.

## 2022-06-07 NOTE — Assessment & Plan Note (Signed)
Check A1c. 

## 2022-06-07 NOTE — Progress Notes (Signed)
Chief Complaint:  Sandra Clay is a 67 y.o. female who presents today for her annual comprehensive physical exam.    Assessment/Plan:  Chronic Problems Addressed Today: Hyperglycemia Check A1c.   Cerebrovascular accident (CVA) [facility]) Recently saw neurology..  Follow-up as needed.  No residual deficits.  Continue statin and aspirin.  Will need to continue with aggressive risk factor management.  Check labs.  Preventative Healthcare: Check labs.  Up-to-date on colon cancer screening.  She will come back for pneumonia and shingles vaccine.  Patient Counseling(The following topics were reviewed and/or handout was given):  -Nutrition: Stressed importance of moderation in sodium/caffeine intake, saturated fat and cholesterol, caloric balance, sufficient intake of fresh fruits, vegetables, and fiber.  -Stressed the importance of regular exercise.   -Substance Abuse: Discussed cessation/primary prevention of tobacco, alcohol, or other drug use; driving or other dangerous activities under the influence; availability of treatment for abuse.   -Injury prevention: Discussed safety belts, safety helmets, smoke detector, smoking near bedding or upholstery.   -Sexuality: Discussed sexually transmitted diseases, partner selection, use of condoms, avoidance of unintended pregnancy and contraceptive alternatives.   -Dental health: Discussed importance of regular tooth brushing, flossing, and dental visits.  -Health maintenance and immunizations reviewed. Please refer to Health maintenance section.  Return to care in 1 year for next preventative visit.     Subjective:  HPI:  She has no acute complaints today.  Last seen a year and half ago.  She is doing well.  Compliant with meds.  Lifestyle Diet: Working with noom.  Exercise: Dance 2-3 times per week. Elliptical check blood work today.  4 times per week.      06/07/2022    1:39 PM  Depression screen PHQ 2/9  Decreased Interest 0  Down,  Depressed, Hopeless 0  PHQ - 2 Score 0    Health Maintenance Due  Topic Date Due   DTaP/Tdap/Td (1 - Tdap) Never done   DEXA SCAN  Never done   COVID-19 Vaccine (1) 12/22/2019     ROS: Limited Per HPI, otherwise a complete review of systems was negative.   PMH:  The following were reviewed and entered/updated in epic: Past Medical History:  Diagnosis Date   Anxiety    Cancer ([place])    skin cancer    Cataract    left forming    Depression    situational , resolved    Headache(784.0)    Osteoarthritis    PONV (postoperative nausea and vomiting)    Stroke ([place]) 03/2020   Patient Active Problem List   Diagnosis Date Noted   PFO (patent foramen ovale)    Cerebrovascular accident (CVA) ([place]) 04/17/2020   History of hip replacement 08/18/2018   OA (osteoarthritis) of hip 08/03/2018   Hyperglycemia 05/18/2018   Skin lump of leg, right 12/30/2017   Genital herpes 12/30/2017   Past Surgical History:  Procedure Laterality Date   BUBBLE STUDY  05/19/2020   Procedure: BUBBLE STUDY;  Surgeon: Sanda Klein, MD;  Location: [facility];  Service: Cardiovascular;;   BUNIONECTOMY     CESAREAN SECTION     x3   CHOLECYSTECTOMY     COLONOSCOPY     ~51 ish per pt  bucini   FRACTURE SURGERY     KNEE ARTHROSCOPY W/ MENISCAL REPAIR Right    x2   PATENT FORAMEN OVALE(PFO) CLOSURE N/A 06/30/2020   Procedure: PATENT FORAMEN OVALE (PFO) CLOSURE;  Surgeon: Sherren Mocha, MD;  Location: [facility];  Service: Cardiovascular;  Laterality: N/A;   ROTATOR CUFF REPAIR  2018   right    TEE WITHOUT CARDIOVERSION N/A 05/19/2020   Procedure: TRANSESOPHAGEAL ECHOCARDIOGRAM (TEE);  Surgeon: Sanda Klein, MD;  Location: [facility];  Service: Cardiovascular;  Laterality: N/A;   TOTAL HIP ARTHROPLASTY Left 08/03/2018   Procedure: TOTAL HIP ARTHROPLASTY ANTERIOR APPROACH;  Surgeon: Gaynelle Arabian, MD;  Location: [facility];  Service: Orthopedics;  Laterality: Left;   TUBAL LIGATION     tubal  reversal      Family History  Problem Relation Age of Onset   Alcohol abuse Mother    Breast cancer Mother    Alcohol abuse Father    Arthritis Father    Cancer Father    Diabetes Father    Hearing loss Father    Colon cancer Sister 54   Alcohol abuse Brother    Cancer Brother    Depression Brother    Drug abuse Brother    Early death Brother    Hearing loss Brother    Depression Sister    Alcohol abuse Sister    Breast cancer Maternal Aunt    Colon polyps Neg Hx    Esophageal cancer Neg Hx    Rectal cancer Neg Hx    Stomach cancer Neg Hx     Medications- reviewed and updated Current Outpatient Medications  Medication Sig Dispense Refill   aspirin EC 81 MG tablet Take 81 mg by mouth in the morning. Swallow whole.     estradiol (ESTRACE) 0.1 MG/GM vaginal cream Place 0.5 grams vaginally 2 times a week at bedtime. 42.5 g 4   rosuvastatin (CRESTOR) 20 MG tablet TAKE 1 TABLET BY MOUTH ONCE A DAY 90 tablet 3   valACYclovir (VALTREX) 1000 MG tablet Take 1 tablet (1,000 mg total) by mouth daily. 90 tablet 4   No current facility-administered medications for this visit.    Allergies-reviewed and updated Allergies  Allergen Reactions   Dilaudid [Hydromorphone] Itching   Sulfa Antibiotics Rash    Social History   Socioeconomic History   Marital status: Single    Spouse name: Not on file   Number of children: Not on file   Years of education: Not on file   Highest education level: Not on file  Occupational History   Occupation: Full time  Tobacco Use   Smoking status: Former   Smokeless tobacco: Never  Substance and Sexual Activity   Alcohol use: Yes    Alcohol/week: 1.0 - 2.0 standard drink of alcohol    Types: 1 - 2 Glasses of wine per week    Comment: one to two glasses of wine per day    Drug use: No   Sexual activity: Yes    Birth control/protection: Post-menopausal  Other Topics Concern   Not on file  Social History Narrative   Lives with significant  other, [place].   Right Handed   Drinks 2-3 cups caffeine daily   Social Determinants of Health   Financial Resource Strain: Not on file  Food Insecurity: Not on file  Transportation Needs: Not on file  Physical Activity: Not on file  Stress: Not on file  Social Connections: Not on file        Objective:  Physical Exam: BP 128/78   Pulse 66   Temp 98 F (36.7 C) (Temporal)   Ht 5\' 6"  (1.676 m)   Wt 169 lb 6.4 oz (76.8 kg)   SpO2 100%   BMI 27.34 kg/m  Body mass index is 27.34 kg/m. Wt Readings from Last 3 Encounters:  06/07/22 169 lb 6.4 oz (76.8 kg)  05/21/22 170 lb 12.8 oz (77.5 kg)  12/07/21 172 lb (78 kg)   Gen: NAD, resting comfortably HEENT: TMs normal bilaterally. OP clear. No thyromegaly noted.  CV: RRR with no murmurs appreciated Pulm: NWOB, CTAB with no crackles, wheezes, or rhonchi GI: Normal bowel sounds present. Soft, Nontender, Nondistended. MSK: no edema, cyanosis, or clubbing noted Skin: warm, dry Neuro: CN2-12 grossly intact. Strength 5/5 in upper and lower extremities. Reflexes symmetric and intact bilaterally.  Psych: Normal affect and thought content     Cinthia Rodden M. [name], MD 06/07/2022 2:14 PM

## 2022-06-07 NOTE — Patient Instructions (Signed)
It was very nice to see you today!  Keep up the great work with your diet and exercise.  Please come back for blood work.  Will see you back in a year for your next physical.  Come back sooner if needed.  Take care, Dr [name]  PLEASE NOTE:  If you had any lab tests, please let us know if you have not heard back within a few days. You may see your results on mychart before we have a chance to review them but we will give you a call once they are reviewed by [place].   If we ordered any referrals today, please let us know if you have not heard from their office within the next week.   If you had any urgent prescriptions sent in today, please check with the pharmacy within an hour of our visit to make sure the prescription was transmitted appropriately.   Please try these tips to maintain a healthy lifestyle:  Eat at least 3 REAL meals and 1-2 snacks per day.  Aim for no more than 5 hours between eating.  If you eat breakfast, please do so within one hour of getting up.   Each meal should contain half fruits/vegetables, one quarter protein, and one quarter carbs (no bigger than a computer mouse)  Cut down on sweet beverages. This includes juice, soda, and sweet tea.   Drink at least 1 glass of water with each meal and aim for at least 8 glasses per day  Exercise at least 150 minutes every week.    Preventive Care 62 Years and Older, Female Preventive care refers to lifestyle choices and visits with your health care provider that can promote health and wellness. Preventive care visits are also called wellness exams. What can I expect for my preventive care visit? Counseling Your health care provider may ask you questions about your: Medical history, including: Past medical problems. Family medical history. Pregnancy and menstrual history. History of falls. Current health, including: Memory and ability to understand (cognition). Emotional well-being. Home life and relationship  well-being. Sexual activity and sexual health. Lifestyle, including: Alcohol, nicotine or tobacco, and drug use. Access to firearms. Diet, exercise, and sleep habits. Work and work Statistician. Sunscreen use. Safety issues such as seatbelt and bike helmet use. Physical exam Your health care provider will check your: Height and weight. These may be used to calculate your BMI (body mass index). BMI is a measurement that tells if you are at a healthy weight. Waist circumference. This measures the distance around your waistline. This measurement also tells if you are at a healthy weight and may help predict your risk of certain diseases, such as type 2 diabetes and high blood pressure. Heart rate and blood pressure. Body temperature. Skin for abnormal spots. What immunizations do I need?  Vaccines are usually given at various ages, according to a schedule. Your health care provider will recommend vaccines for you based on your age, medical history, and lifestyle or other factors, such as travel or where you work. What tests do I need? Screening Your health care provider may recommend screening tests for certain conditions. This may include: Lipid and cholesterol levels. Hepatitis C test. Hepatitis B test. HIV (human immunodeficiency virus) test. STI (sexually transmitted infection) testing, if you are at risk. Lung cancer screening. Colorectal cancer screening. Diabetes screening. This is done by checking your blood sugar (glucose) after you have not eaten for a while (fasting). Mammogram. Talk with your health care provider about  how often you should have regular mammograms. BRCA-related cancer screening. This may be done if you have a family history of breast, ovarian, tubal, or peritoneal cancers. Bone density scan. This is done to screen for osteoporosis. Talk with your health care provider about your test results, treatment options, and if necessary, the need for more tests. Follow  these instructions at home: Eating and drinking  Eat a diet that includes fresh fruits and vegetables, whole grains, lean protein, and low-fat dairy products. Limit your intake of foods with high amounts of sugar, saturated fats, and salt. Take vitamin and mineral supplements as recommended by your health care provider. Do not drink alcohol if your health care provider tells you not to drink. If you drink alcohol: Limit how much you have to 0-1 drink a day. Know how much alcohol is in your drink. In the [place] one drink equals one 12 oz bottle of beer (355 mL), one 5 oz glass of wine (148 mL), or one 1 oz glass of hard liquor (44 mL). Lifestyle Brush your teeth every morning and night with fluoride toothpaste. Floss one time each day. Exercise for at least 30 minutes 5 or more days each week. Do not use any products that contain nicotine or tobacco. These products include cigarettes, chewing tobacco, and vaping devices, such as e-cigarettes. If you need help quitting, ask your health care provider. Do not use drugs. If you are sexually active, practice safe sex. Use a condom or other form of protection in order to prevent STIs. Take aspirin only as told by your health care provider. Make sure that you understand how much to take and what form to take. Work with your health care provider to find out whether it is safe and beneficial for you to take aspirin daily. Ask your health care provider if you need to take a cholesterol-lowering medicine (statin). Find healthy ways to manage stress, such as: Meditation, yoga, or listening to music. Journaling. Talking to a trusted person. Spending time with friends and family. Minimize exposure to UV radiation to reduce your risk of skin cancer. Safety Always wear your seat belt while driving or riding in a vehicle. Do not drive: If you have been drinking alcohol. Do not ride with someone who has been drinking. When you are tired or distracted. While  texting. If you have been using any mind-altering substances or drugs. Wear a helmet and other protective equipment during sports activities. If you have firearms in your house, make sure you follow all gun safety procedures. What's next? Visit your health care provider once a year for an annual wellness visit. Ask your health care provider how often you should have your eyes and teeth checked. Stay up to date on all vaccines. This information is not intended to replace advice given to you by your health care provider. Make sure you discuss any questions you have with your health care provider. Document Revised: 08/30/2020 Document Reviewed: 08/30/2020 Elsevier Patient Education  [place].

## 2022-06-13 DIAGNOSIS — M1711 Unilateral primary osteoarthritis, right knee: Secondary | ICD-10-CM | POA: Diagnosis not present

## 2022-06-20 ENCOUNTER — Other Ambulatory Visit: Payer: Commercial Managed Care - PPO

## 2022-07-11 ENCOUNTER — Other Ambulatory Visit (INDEPENDENT_AMBULATORY_CARE_PROVIDER_SITE_OTHER): Payer: Commercial Managed Care - PPO

## 2022-07-11 DIAGNOSIS — R739 Hyperglycemia, unspecified: Secondary | ICD-10-CM | POA: Diagnosis not present

## 2022-07-11 DIAGNOSIS — Z0001 Encounter for general adult medical examination with abnormal findings: Secondary | ICD-10-CM | POA: Diagnosis not present

## 2022-07-11 DIAGNOSIS — I639 Cerebral infarction, unspecified: Secondary | ICD-10-CM | POA: Diagnosis not present

## 2022-07-11 LAB — LIPID PANEL
Cholesterol: 117 mg/dL (ref 0–200)
HDL: 70.2 mg/dL (ref >39.00)
LDL Cholesterol: 38 mg/dL (ref 0–99)
NonHDL: 47.12
Total CHOL/HDL Ratio: 2
Triglycerides: 48 mg/dL (ref 0.0–149.0)
VLDL: 9.6 mg/dL (ref 0.0–40.0)

## 2022-07-11 LAB — COMPREHENSIVE METABOLIC PANEL
ALT: 20 U/L (ref 0–35)
AST: 27 U/L (ref 0–37)
Albumin: 4.2 g/dL (ref 3.5–5.2)
Alkaline Phosphatase: 87 U/L (ref 39–117)
BUN: 18 mg/dL (ref 6–23)
CO2: 28 mEq/L (ref 19–32)
Calcium: 9.5 mg/dL (ref 8.4–10.5)
Chloride: 105 mEq/L (ref 96–112)
Creatinine, Ser: 0.83 mg/dL (ref 0.40–1.20)
GFR: 72 mL/min (ref >60.00)
Glucose, Bld: 108 mg/dL — ABNORMAL HIGH (ref 70–99)
Potassium: 4.2 mEq/L (ref 3.5–5.1)
Sodium: 140 mEq/L (ref 135–145)
Total Bilirubin: 0.6 mg/dL (ref 0.2–1.2)
Total Protein: 6.7 g/dL (ref 6.0–8.3)

## 2022-07-11 LAB — CBC
HCT: 40.3 % (ref 36.0–46.0)
Hemoglobin: 13.5 g/dL (ref 12.0–15.0)
MCHC: 33.5 g/dL (ref 30.0–36.0)
MCV: 97.1 fl (ref 78.0–100.0)
Platelets: 215 10*3/uL (ref 150.0–400.0)
RBC: 4.15 Mil/uL (ref 3.87–5.11)
RDW: 14 % (ref 11.5–15.5)
WBC: 5 10*3/uL (ref 4.0–10.5)

## 2022-07-11 LAB — TSH: TSH: 1.21 u[IU]/mL (ref 0.35–5.50)

## 2022-07-11 LAB — HEMOGLOBIN A1C: Hgb A1c MFr Bld: 5.7 % (ref 4.6–6.5)

## 2022-07-12 NOTE — Progress Notes (Signed)
Glucose is borderline but everything else is NORMAL. Do not need to make any changes to her treatment plan at this time. She should continue to work on diet and exercise and we can recheck in a year.  Sandra Clay. [name], MD 07/12/2022 8:18 AM

## 2022-07-25 ENCOUNTER — Other Ambulatory Visit (HOSPITAL_COMMUNITY): Payer: Self-pay | Admitting: Obstetrics and Gynecology

## 2022-07-25 ENCOUNTER — Other Ambulatory Visit: Payer: Self-pay | Admitting: Family Medicine

## 2022-07-25 ENCOUNTER — Other Ambulatory Visit (HOSPITAL_COMMUNITY): Payer: Self-pay

## 2022-07-25 DIAGNOSIS — Z1231 Encounter for screening mammogram for malignant neoplasm of breast: Secondary | ICD-10-CM

## 2022-07-25 MED ORDER — ROSUVASTATIN CALCIUM 20 MG PO TABS
ORAL_TABLET | Freq: Every day | ORAL | 3 refills | Status: DC
  Filled 2022-07-25: qty 90, 90d supply, fill #0
  Filled 2022-10-25: qty 90, 90d supply, fill #1
  Filled 2023-01-31: qty 90, 90d supply, fill #2
  Filled 2023-05-08: qty 90, 90d supply, fill #3

## 2022-08-01 DIAGNOSIS — Z96642 Presence of left artificial hip joint: Secondary | ICD-10-CM | POA: Diagnosis not present

## 2022-08-16 ENCOUNTER — Other Ambulatory Visit (HOSPITAL_COMMUNITY): Payer: Self-pay

## 2022-08-16 DIAGNOSIS — M19072 Primary osteoarthritis, left ankle and foot: Secondary | ICD-10-CM | POA: Diagnosis not present

## 2022-08-16 MED ORDER — PREDNISONE 10 MG PO TABS
ORAL_TABLET | ORAL | 0 refills | Status: DC
  Filled 2022-08-16: qty 42, 12d supply, fill #0

## 2022-08-16 MED ORDER — MELOXICAM 15 MG PO TABS
15.0000 mg | ORAL_TABLET | Freq: Every day | ORAL | 0 refills | Status: DC
  Filled 2022-08-16: qty 30, 30d supply, fill #0

## 2022-09-06 ENCOUNTER — Observation Stay (HOSPITAL_COMMUNITY)
Admission: EM | Admit: 2022-09-06 | Discharge: 2022-09-07 | Disposition: A | Discharge status: Home / Self Care | Payer: Commercial Managed Care - PPO | Attending: Internal Medicine | Admitting: Internal Medicine

## 2022-09-06 ENCOUNTER — Other Ambulatory Visit: Payer: Self-pay

## 2022-09-06 ENCOUNTER — Emergency Department (HOSPITAL_COMMUNITY): Payer: Commercial Managed Care - PPO

## 2022-09-06 DIAGNOSIS — R61 Generalized hyperhidrosis: Secondary | ICD-10-CM | POA: Diagnosis not present

## 2022-09-06 DIAGNOSIS — E785 Hyperlipidemia, unspecified: Secondary | ICD-10-CM | POA: Diagnosis not present

## 2022-09-06 DIAGNOSIS — I959 Hypotension, unspecified: Secondary | ICD-10-CM | POA: Diagnosis not present

## 2022-09-06 DIAGNOSIS — Z87891 Personal history of nicotine dependence: Secondary | ICD-10-CM | POA: Insufficient documentation

## 2022-09-06 DIAGNOSIS — Z96642 Presence of left artificial hip joint: Secondary | ICD-10-CM | POA: Insufficient documentation

## 2022-09-06 DIAGNOSIS — X31XXXA Exposure to excessive natural cold, initial encounter: Secondary | ICD-10-CM | POA: Diagnosis not present

## 2022-09-06 DIAGNOSIS — B009 Herpesviral infection, unspecified: Secondary | ICD-10-CM | POA: Diagnosis present

## 2022-09-06 DIAGNOSIS — Z1152 Encounter for screening for COVID-19: Secondary | ICD-10-CM | POA: Insufficient documentation

## 2022-09-06 DIAGNOSIS — R0902 Hypoxemia: Secondary | ICD-10-CM | POA: Diagnosis not present

## 2022-09-06 DIAGNOSIS — T68XXXA Hypothermia, initial encounter: Secondary | ICD-10-CM | POA: Diagnosis present

## 2022-09-06 DIAGNOSIS — Z79899 Other long term (current) drug therapy: Secondary | ICD-10-CM | POA: Insufficient documentation

## 2022-09-06 DIAGNOSIS — R001 Bradycardia, unspecified: Secondary | ICD-10-CM | POA: Diagnosis not present

## 2022-09-06 DIAGNOSIS — Z85828 Personal history of other malignant neoplasm of skin: Secondary | ICD-10-CM | POA: Insufficient documentation

## 2022-09-06 DIAGNOSIS — R531 Weakness: Secondary | ICD-10-CM | POA: Diagnosis not present

## 2022-09-06 DIAGNOSIS — Z8673 Personal history of transient ischemic attack (TIA), and cerebral infarction without residual deficits: Secondary | ICD-10-CM | POA: Insufficient documentation

## 2022-09-06 DIAGNOSIS — R55 Syncope and collapse: Secondary | ICD-10-CM | POA: Diagnosis not present

## 2022-09-06 DIAGNOSIS — Q2112 Patent foramen ovale: Secondary | ICD-10-CM

## 2022-09-06 LAB — CBC WITH DIFFERENTIAL/PLATELET
Abs Immature Granulocytes: 0.02 10*3/uL (ref 0.00–0.07)
Basophils Absolute: 0 10*3/uL (ref 0.0–0.1)
Basophils Relative: 1 %
Eosinophils Absolute: 0.1 10*3/uL (ref 0.0–0.5)
Eosinophils Relative: 2 %
HCT: 37 % (ref 36.0–46.0)
Hemoglobin: 12.2 g/dL (ref 12.0–15.0)
Immature Granulocytes: 0 %
Lymphocytes Relative: 22 %
Lymphs Abs: 1.1 10*3/uL (ref 0.7–4.0)
MCH: 31.9 pg (ref 26.0–34.0)
MCHC: 33 g/dL (ref 30.0–36.0)
MCV: 96.9 fL (ref 80.0–100.0)
Monocytes Absolute: 0.3 10*3/uL (ref 0.1–1.0)
Monocytes Relative: 7 %
Neutro Abs: 3.5 10*3/uL (ref 1.7–7.7)
Neutrophils Relative %: 68 %
Platelets: 167 10*3/uL (ref 150–400)
RBC: 3.82 MIL/uL — ABNORMAL LOW (ref 3.87–5.11)
RDW: 14.1 % (ref 11.5–15.5)
WBC: 5.1 10*3/uL (ref 4.0–10.5)
nRBC: 0 % (ref 0.0–0.2)

## 2022-09-06 LAB — I-STAT CHEM 8, ED
BUN: 29 mg/dL — ABNORMAL HIGH (ref 8–23)
Calcium, Ion: 1.09 mmol/L — ABNORMAL LOW (ref 1.15–1.40)
Chloride: 106 mmol/L (ref 98–111)
Creatinine, Ser: 0.8 mg/dL (ref 0.44–1.00)
Glucose, Bld: 140 mg/dL — ABNORMAL HIGH (ref 70–99)
HCT: 35 % — ABNORMAL LOW (ref 36.0–46.0)
Hemoglobin: 11.9 g/dL — ABNORMAL LOW (ref 12.0–15.0)
Potassium: 4.6 mmol/L (ref 3.5–5.1)
Sodium: 141 mmol/L (ref 135–145)
TCO2: 26 mmol/L (ref 22–32)

## 2022-09-06 LAB — HEPATIC FUNCTION PANEL
ALT: 24 U/L (ref 0–44)
AST: 35 U/L (ref 15–41)
Albumin: 3.4 g/dL — ABNORMAL LOW (ref 3.5–5.0)
Alkaline Phosphatase: 76 U/L (ref 38–126)
Bilirubin, Direct: 0.1 mg/dL (ref 0.0–0.2)
Indirect Bilirubin: 0.3 mg/dL (ref 0.3–0.9)
Total Bilirubin: 0.4 mg/dL (ref 0.3–1.2)
Total Protein: 6 g/dL — ABNORMAL LOW (ref 6.5–8.1)

## 2022-09-06 LAB — TSH: TSH: 0.574 u[IU]/mL (ref 0.350–4.500)

## 2022-09-06 LAB — BASIC METABOLIC PANEL
Anion gap: 9 (ref 5–15)
BUN: 19 mg/dL (ref 8–23)
CO2: 24 mmol/L (ref 22–32)
Calcium: 8.6 mg/dL — ABNORMAL LOW (ref 8.9–10.3)
Chloride: 107 mmol/L (ref 98–111)
Creatinine, Ser: 0.69 mg/dL (ref 0.44–1.00)
GFR, Estimated: >60 mL/min (ref >60)
Glucose, Bld: 110 mg/dL — ABNORMAL HIGH (ref 70–99)
Potassium: 4.2 mmol/L (ref 3.5–5.1)
Sodium: 140 mmol/L (ref 135–145)

## 2022-09-06 LAB — LIPASE, BLOOD: Lipase: 31 U/L (ref 11–51)

## 2022-09-06 LAB — URINALYSIS, ROUTINE W REFLEX MICROSCOPIC
Bacteria, UA: NONE SEEN
Bilirubin Urine: NEGATIVE
Glucose, UA: NEGATIVE mg/dL
Hgb urine dipstick: NEGATIVE
Ketones, ur: 5 mg/dL — AB
Nitrite: NEGATIVE
Protein, ur: NEGATIVE mg/dL
Specific Gravity, Urine: 1.013 (ref 1.005–1.030)
pH: 7 (ref 5.0–8.0)

## 2022-09-06 LAB — C-REACTIVE PROTEIN: CRP: 0.6 mg/dL (ref <1.0)

## 2022-09-06 LAB — TROPONIN I (HIGH SENSITIVITY)
Troponin I (High Sensitivity): 4 ng/L (ref <18)
Troponin I (High Sensitivity): 4 ng/L (ref <18)

## 2022-09-06 LAB — SARS CORONAVIRUS 2 BY RT PCR: SARS Coronavirus 2 by RT PCR: NEGATIVE

## 2022-09-06 LAB — LACTIC ACID, PLASMA
Lactic Acid, Venous: 1.3 mmol/L (ref 0.5–1.9)
Lactic Acid, Venous: 0.9 mmol/L (ref 0.5–1.9)

## 2022-09-06 LAB — SEDIMENTATION RATE: Sed Rate: 12 mm/hr (ref 0–22)

## 2022-09-06 LAB — CBG MONITORING, ED: Glucose-Capillary: 130 mg/dL — ABNORMAL HIGH (ref 70–99)

## 2022-09-06 LAB — HIV ANTIBODY (ROUTINE TESTING W REFLEX): HIV Screen 4th Generation wRfx: NONREACTIVE

## 2022-09-06 LAB — D-DIMER, QUANTITATIVE: D-Dimer, Quant: 0.45 ug/mL-FEU (ref 0.00–0.50)

## 2022-09-06 MED ORDER — VANCOMYCIN HCL 1500 MG/300ML IV SOLN
1500.0000 mg | Freq: Once | INTRAVENOUS | Status: AC
  Administered 2022-09-06: 1500 mg via INTRAVENOUS
  Filled 2022-09-06: qty 300

## 2022-09-06 MED ORDER — ASPIRIN 81 MG PO TBEC
81.0000 mg | DELAYED_RELEASE_TABLET | Freq: Every morning | ORAL | Status: DC
  Administered 2022-09-07: 81 mg via ORAL
  Filled 2022-09-06: qty 1

## 2022-09-06 MED ORDER — ROSUVASTATIN CALCIUM 20 MG PO TABS
20.0000 mg | ORAL_TABLET | Freq: Every day | ORAL | Status: DC
  Filled 2022-09-06: qty 1

## 2022-09-06 MED ORDER — SODIUM CHLORIDE 0.9 % IV SOLN: INTRAVENOUS | Status: AC

## 2022-09-06 MED ORDER — PIPERACILLIN-TAZOBACTAM 3.375 G IVPB 30 MIN
3.3750 g | Freq: Once | INTRAVENOUS | Status: AC
  Administered 2022-09-06: 3.375 g via INTRAVENOUS
  Filled 2022-09-06: qty 50

## 2022-09-06 MED ORDER — SODIUM CHLORIDE 0.9 % IV BOLUS
1000.0000 mL | Freq: Once | INTRAVENOUS | Status: AC
  Administered 2022-09-06: 1000 mL via INTRAVENOUS

## 2022-09-06 MED ORDER — VALACYCLOVIR HCL 500 MG PO TABS
1000.0000 mg | ORAL_TABLET | Freq: Every day | ORAL | Status: DC
  Administered 2022-09-07: 1000 mg via ORAL
  Filled 2022-09-06: qty 2

## 2022-09-06 MED ORDER — ENOXAPARIN SODIUM 40 MG/0.4ML IJ SOSY
40.0000 mg | PREFILLED_SYRINGE | INTRAMUSCULAR | Status: DC
  Administered 2022-09-06: 40 mg via SUBCUTANEOUS
  Filled 2022-09-06: qty 0.4

## 2022-09-06 MED ORDER — SODIUM CHLORIDE 0.9% FLUSH
3.0000 mL | Freq: Two times a day (BID) | INTRAVENOUS | Status: DC
  Administered 2022-09-06 – 2022-09-07 (×3): 3 mL via INTRAVENOUS

## 2022-09-06 MED ORDER — ACETAMINOPHEN 325 MG PO TABS: 650.0000 mg | ORAL_TABLET | Freq: Four times a day (QID) | ORAL | Status: DC | PRN

## 2022-09-06 MED ORDER — VANCOMYCIN HCL 750 MG/150ML IV SOLN: 750.0000 mg | Freq: Two times a day (BID) | INTRAVENOUS | Status: DC

## 2022-09-06 MED ORDER — ONDANSETRON HCL 4 MG/2ML IJ SOLN
4.0000 mg | Freq: Once | INTRAMUSCULAR | Status: AC
  Administered 2022-09-06: 4 mg via INTRAVENOUS
  Filled 2022-09-06: qty 2

## 2022-09-06 MED ORDER — PIPERACILLIN-TAZOBACTAM 3.375 G IVPB: 3.3750 g | Freq: Three times a day (TID) | INTRAVENOUS | Status: DC

## 2022-09-06 MED ORDER — ACETAMINOPHEN 650 MG RE SUPP: 650.0000 mg | Freq: Four times a day (QID) | RECTAL | Status: DC | PRN

## 2022-09-06 MED ORDER — ALBUTEROL SULFATE (2.5 MG/3ML) 0.083% IN NEBU: 2.5000 mg | INHALATION_SOLUTION | Freq: Four times a day (QID) | RESPIRATORY_TRACT | Status: DC | PRN

## 2022-09-06 NOTE — H&P (Addendum)
History and Physical    Patient: Sandra Clay WUJ:[id number] DOB: [birth year] DOA: 09/06/2022 DOS: the patient was seen and examined on 09/06/2022 PCP: Ardith Dark, MD  Patient coming from: Home via EMS  Chief Complaint:  Chief Complaint  Patient presents with   Weakness   HPI: Sandra Clay is a 67 y.o. female with medical history significant of  CVA in 03/2020, PFO s/p closure, anxiety, and depression who presents after probably having a syncopal episode.  She had been doing ballroom dancing and was getting ready for upcoming performance this morning when this occurred.  She was  about to get her hair and make-up done when she all of a sudden felt unwell.  She went and got some water, but felt very weak and lightheaded.  She tried laying down on the floor.  Reported having associated symptoms of feeling cold and clammy.  Patient tried to get back up and sit in the chair, but had to be laid back down on the floor where it was reported that she likely lost consciousness temporarily.  Denies having any recent fever shortness of breath, cough, nausea, vomiting, abdominal pain, diarrhea, dysuria, leg swelling, calf pain. She  does not recall having chest pain with this episode, but may have had some discomfort.  She admits that she does not drink a lot of water at baseline.  She had not been outside or working out in her garden like normal due to training for this competition.  She did report weight loss of 25 pounds since January with dietary changes.  The only recent changes that due to her dancing she had developed some inflammation in her left foot for which she had taken a prednisone Dosepak that she completed this past week.  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   In the emergency department patient noted to be hypothermic with pulse 54-58, and all other vital signs maintained.  Labs noted WBC 5.1, hemoglobin 12.2-> 11.9, D-dimer  0.45, and high-sensitivity troponin negative.  Blood cultures have been obtained.  Patient was bolused 2 L of IV fluids and started on empiric antibiotics of vancomycin and Zosyn. Lactic acid came back at 1.3.  Review of Systems: As mentioned in the history of present illness. All other systems reviewed and are negative. Past Medical History:  Diagnosis Date   Anxiety    Cancer (HCC)    skin cancer    Cataract    left forming    Depression    situational , resolved    Headache(784.0)    Osteoarthritis    PONV (postoperative nausea and vomiting)    Stroke (HCC) 03/2020   Past Surgical History:  Procedure Laterality Date   BUBBLE STUDY  05/19/2020   Procedure: BUBBLE STUDY;  Surgeon: Thurmon Fair, MD;  [facility];  Service: Cardiovascular;;   BUNIONECTOMY     CESAREAN SECTION     x3   CHOLECYSTECTOMY     COLONOSCOPY     ~51 ish per pt  bucini   FRACTURE SURGERY     KNEE ARTHROSCOPY W/ MENISCAL REPAIR Right    x2   PATENT FORAMEN OVALE(PFO) CLOSURE N/A 06/30/2020   Procedure: PATENT FORAMEN OVALE (PFO) CLOSURE;  Surgeon: Tonny Bollman, MD;  [facility];  Service: Cardiovascular;  Laterality: N/A;   ROTATOR CUFF REPAIR  2018   right    TEE WITHOUT CARDIOVERSION N/A 05/19/2020   Procedure:  TRANSESOPHAGEAL ECHOCARDIOGRAM (TEE);  Surgeon: Thurmon Fair, MD;  Location: [facility] ENDOSCOPY;  Service: Cardiovascular;  Laterality: N/A;   TOTAL HIP ARTHROPLASTY Left 08/03/2018   Procedure: TOTAL HIP ARTHROPLASTY ANTERIOR APPROACH;  Surgeon: Ollen Gross, MD;  Location: [facility];  Service: Orthopedics;  Laterality: Left;   TUBAL LIGATION     tubal reversal     Social History:  reports that she has quit smoking. She has never used smokeless tobacco. She reports current alcohol use of about 1.0 - 2.0 standard drink of alcohol per week. She reports that she does not use drugs.  Allergies  Allergen Reactions   Dilaudid [Hydromorphone] Itching   Sulfa Antibiotics  Rash    Family History  Problem Relation Age of Onset   Alcohol abuse Mother    Breast cancer Mother    Alcohol abuse Father    Arthritis Father    Cancer Father    Diabetes Father    Hearing loss Father    Colon cancer Sister 48   Alcohol abuse Brother    Cancer Brother    Depression Brother    Drug abuse Brother    Early death Brother    Hearing loss Brother    Depression Sister    Alcohol abuse Sister    Breast cancer Maternal Aunt    Colon polyps Neg Hx    Esophageal cancer Neg Hx    Rectal cancer Neg Hx    Stomach cancer Neg Hx     Prior to Admission medications   Medication Sig Start Date End Date Taking? Authorizing Provider  aspirin EC 81 MG tablet Take 81 mg by mouth in the morning. Swallow whole.    [provider]  estradiol (ESTRACE) 0.1 MG/GM vaginal cream Place 0.5 grams vaginally 2 times a week at bedtime. 01/14/22     meloxicam (MOBIC) 15 MG tablet Take 1 tablet (15 mg total) by mouth daily with a meal 08/16/22     predniSONE (DELTASONE) 10 MG tablet Take 6 tablets by mouth for 2 days then take 5 tablets for 2 days then take 4 tablets for 2 days then take 3 tablets for 2 days then take 2 tablets for 2 days then take 1 tablet for 2 days. 08/16/22     rosuvastatin (CRESTOR) 20 MG tablet TAKE 1 TABLET BY MOUTH ONCE A DAY 07/25/22 07/25/23  Ardith Dark, MD  valACYclovir (VALTREX) 1000 MG tablet Take 1 tablet (1,000 mg total) by mouth daily. 01/14/22       Physical Exam: Vitals:   09/06/22 0936 09/06/22 0945 09/06/22 1030 09/06/22 1155  BP:  110/60 127/69   Pulse: (!) 56 (!) 55 (!) 54   Resp:  16 11   Temp:    (!) 95 F (35 C)  TempSrc:    Rectal  SpO2: 100% 100% 97%   Weight:      Height:        Constitutional: Older adult female NAD, calm, comfortable Eyes: PERRL, lids and conjunctivae normal ENMT: Mucous membranes are moist. Posterior pharynx clear of any exudate or lesions.  Neck: normal, supple,  Respiratory: clear to auscultation  bilaterally, no wheezing, no crackles. Normal respiratory effort. No accessory muscle use.  Cardiovascular: Regular rate and rhythm, no murmurs / rubs / gallops. No extremity edema. 2+ pedal pulses  Abdomen: no tenderness, no masses palpated.  Bowel sounds positive.  Musculoskeletal: no clubbing / cyanosis. No joint deformity upper and lower extremities. Good ROM, no contractures. Normal muscle tone.  Skin: no rashes, lesions, ulcers.  Poor skin turgor Neurologic: CN 2-12 grossly intact.  Patient able to move all extremities Psychiatric: Normal judgment and insight. Alert and oriented x 3. Normal mood.   Data Reviewed:  EKG reveals sinus bradycardia 59 bpm.  Reviewed labs, imaging, and pertinent records as noted above in HPI  Assessment and Plan:  Syncope/near syncope Acute.  Patient presents after having what was reported to be a episode of loss of conscious with reports of impending doom and diaphoresis prior to episode.  She had been preparing for a ballroom dancing competition and at baseline does not drink a lot of fluids.  Initial blood pressures and heart rates noted to be low.  On physical exam patient noted to have poor skin turgor.  Low suspicion for pulmonary embolism as D-dimer 0.45 and she reports being active.  Patient had been bolused 2 L of normal saline IV fluids.  On the differential includes orthostatic hypotension, arrhythmia, orpossibility of infection as a cause of her symptoms. -Admit to a telemetry bed -Check orthostatic vital signs -Normal saline IV fluids at 75 ml/hr. -Check echocardiogram -Follow-up telemetry  Hypothermia Acute.  Initial temperature 95 F.  No other SIRS criteria met.  Lactic acid was reassuring.  Patient had initially been started on empiric antibiotics of vancomycin and Zosyn . -Continue warming blanket per protocol -Follow-up blood cultures -Check TSH, ESR (12), and CRP(0.6) -Discontinue antibiotics  PFO s/p closure PFO with atrial septal  aneurysm s/p PFO closure with amplatzer PFO occluder device. -Follow-up echocardiogram -Formally consult cardiology if warranted  Hyperlipidemia -Continue Crestor  HSV Patient chronically on Valtrex. -Continue Valtrex   DVT prophylaxis: Lovenox Advance Care Planning:   Code Status: Full Code    Consults: None  Family Communication: Patient notes that she will update her family  Severity of Illness: The appropriate patient status for this patient is OBSERVATION. Observation status is judged to be reasonable and necessary in order to provide the required intensity of service to ensure the patient's safety. The patient's presenting symptoms, physical exam findings, and initial radiographic and laboratory data in the context of their medical condition is felt to place them at decreased risk for further clinical deterioration. Furthermore, it is anticipated that the patient will be medically stable for discharge from the hospital within 2 midnights of admission.   Author: Clydie Braun, MD 09/06/2022 1:06 PM  For on call review [email or web address].

## 2022-09-06 NOTE — ED Notes (Signed)
Removed patient from bair hugger, temperature 99.1 oral.

## 2022-09-06 NOTE — ED Notes (Signed)
ED TO INPATIENT HANDOFF REPORT  ED Nurse Name and Phone #: Angelica Chessman [phone]  S Name/Age/Gender Sandra Clay 67 y.o. female Room/Bed: 030C/030C  Code Status   Code Status: Full Code  Home/SNF/Other Home Patient oriented to: self, place, time, and situation Is this baseline? Yes   Triage Complete: Triage complete  Chief Complaint Syncope [R55]  Triage Note PT BIB EMS from a dance studio, states all of a sudden she just felt weak and nauseous, pt was pale and diaphoretic, did not pass out but was assisted to the floor.   90/ palp Hr 60 141 CBG  18 g L AC 400 mdf NS    Allergies Allergies  Allergen Reactions   Dilaudid [Hydromorphone] Itching   Sulfa Antibiotics Rash    Level of Care/Admitting Diagnosis ED Disposition     ED Disposition  Admit   Condition  --   Comment  Hospital Area: [facility] [[id number]]  Level of Care: Telemetry Medical [104]  May place patient in observation at [facility] or [facility] if equivalent level of care is available:: No  Covid Evaluation: Asymptomatic - no recent exposure (last 10 days) testing not required  Diagnosis: Syncope [206001]  Admitting Physician: Clydie Braun [[id number]]  Attending Physician: Clydie Braun [[id number]]          B Medical/Surgery History Past Medical History:  Diagnosis Date   Anxiety    Cancer (HCC)    skin cancer    Cataract    left forming    Depression    situational , resolved    Headache(784.0)    Osteoarthritis    PONV (postoperative nausea and vomiting)    Stroke (HCC) 03/2020   Past Surgical History:  Procedure Laterality Date   BUBBLE STUDY  05/19/2020   Procedure: BUBBLE STUDY;  Surgeon: Thurmon Fair, MD;  [facility];  Service: Cardiovascular;;   BUNIONECTOMY     CESAREAN SECTION     x3   CHOLECYSTECTOMY     COLONOSCOPY     ~51 ish per pt  bucini   FRACTURE SURGERY     KNEE ARTHROSCOPY W/ MENISCAL REPAIR Right    x2   PATENT FORAMEN  OVALE(PFO) CLOSURE N/A 06/30/2020   Procedure: PATENT FORAMEN OVALE (PFO) CLOSURE;  Surgeon: Tonny Bollman, MD;  [facility];  Service: Cardiovascular;  Laterality: N/A;   ROTATOR CUFF REPAIR  2018   right    TEE WITHOUT CARDIOVERSION N/A 05/19/2020   Procedure: TRANSESOPHAGEAL ECHOCARDIOGRAM (TEE);  Surgeon: Thurmon Fair, MD;  Location: [facility] ENDOSCOPY;  Service: Cardiovascular;  Laterality: N/A;   TOTAL HIP ARTHROPLASTY Left 08/03/2018   Procedure: TOTAL HIP ARTHROPLASTY ANTERIOR APPROACH;  Surgeon: Ollen Gross, MD;  Location: [facility];  Service: Orthopedics;  Laterality: Left;   TUBAL LIGATION     tubal reversal       A IV Location/Drains/Wounds Patient Lines/Drains/Airways Status     Active Line/Drains/Airways     Name Placement date Placement time Site Days   Peripheral IV 05/19/20 Anterior;Distal;Left Forearm 05/19/20  0757  Forearm  840            Intake/Output Last 24 hours No intake or output data in the 24 hours ending 09/06/22 1423  Labs/Imaging Results for orders placed or performed during the hospital encounter of 09/06/22 (from the past 48 hour(s))  POC CBG, ED     Status: Abnormal   Collection Time: 09/06/22  9:48 AM  Result Value Ref  Range   Glucose-Capillary 130 (H) 70 - 99 mg/dL    Comment: Glucose reference range applies only to samples taken after fasting for at least 8 hours.   Comment 1 Notify RN    Comment 2 Document in Chart   CBC with Differential     Status: Abnormal   Collection Time: 09/06/22  9:49 AM  Result Value Ref Range   WBC 5.1 4.0 - 10.5 K/uL   RBC 3.82 (L) 3.87 - 5.11 MIL/uL   Hemoglobin 12.2 12.0 - 15.0 g/dL   HCT 16.1 09.6 - 04.5 %   MCV 96.9 80.0 - 100.0 fL   MCH 31.9 26.0 - 34.0 pg   MCHC 33.0 30.0 - 36.0 g/dL   RDW 40.9 81.1 - 91.4 %   Platelets 167 150 - 400 K/uL   nRBC 0.0 0.0 - 0.2 %   Neutrophils Relative % 68 %   Neutro Abs 3.5 1.7 - 7.7 K/uL   Lymphocytes Relative 22 %   Lymphs Abs 1.1 0.7 - 4.0  K/uL   Monocytes Relative 7 %   Monocytes Absolute 0.3 0.1 - 1.0 K/uL   Eosinophils Relative 2 %   Eosinophils Absolute 0.1 0.0 - 0.5 K/uL   Basophils Relative 1 %   Basophils Absolute 0.0 0.0 - 0.1 K/uL   Immature Granulocytes 0 %   Abs Immature Granulocytes 0.02 0.00 - 0.07 K/uL    Comment: Performed at [facility], [address] [place], [place] [address]  D-dimer, quantitative     Status: None   Collection Time: 09/06/22  9:49 AM  Result Value Ref Range   D-Dimer, Quant 0.45 0.00 - 0.50 ug/mL-FEU    Comment: (NOTE) At the manufacturer cut-off value of 0.5 g/mL FEU, this assay has a negative predictive value of 95-100%.This assay is intended for use in conjunction with a clinical pretest probability (PTP) assessment model to exclude pulmonary embolism (PE) and deep venous thrombosis (DVT) in outpatients suspected of PE or DVT. Results should be correlated with clinical presentation. Performed at [facility], [address], [place], [place] [address]   I-stat chem 8, ED (not at [facility] or [facility])     Status: Abnormal   Collection Time: 09/06/22 10:01 AM  Result Value Ref Range   Sodium 141 135 - 145 mmol/L   Potassium 4.6 3.5 - 5.1 mmol/L   Chloride 106 98 - 111 mmol/L   BUN 29 (H) 8 - 23 mg/dL   Creatinine, Ser 8.65 0.44 - 1.00 mg/dL   Glucose, Bld 784 (H) 70 - 99 mg/dL    Comment: Glucose reference range applies only to samples taken after fasting for at least 8 hours.   Calcium, Ion 1.09 (L) 1.15 - 1.40 mmol/L   TCO2 26 22 - 32 mmol/L   Hemoglobin 11.9 (L) 12.0 - 15.0 g/dL   HCT 69.6 (L) 29.5 - 28.4 %  Basic metabolic panel     Status: Abnormal   Collection Time: 09/06/22 11:22 AM  Result Value Ref Range   Sodium 140 135 - 145 mmol/L   Potassium 4.2 3.5 - 5.1 mmol/L   Chloride 107 98 - 111 mmol/L   CO2 24 22 - 32 mmol/L   Glucose, Bld 110 (H) 70 - 99 mg/dL    Comment: Glucose reference range applies only to samples taken after fasting for at least 8  hours.   BUN 19 8 - 23 mg/dL   Creatinine, Ser 1.32 0.44 -  1.00 mg/dL   Calcium 8.6 (L) 8.9 - 10.3 mg/dL   GFR, Estimated >24 >40 mL/min    Comment: (NOTE) Calculated using the CKD-EPI Creatinine Equation (2021)    Anion gap 9 5 - 15    Comment: Performed at [facility], [address], [place], [place] [address]  Hepatic function panel     Status: Abnormal   Collection Time: 09/06/22 11:22 AM  Result Value Ref Range   Total Protein 6.0 (L) 6.5 - 8.1 g/dL   Albumin 3.4 (L) 3.5 - 5.0 g/dL   AST 35 15 - 41 U/L   ALT 24 0 - 44 U/L   Alkaline Phosphatase 76 38 - 126 U/L   Total Bilirubin 0.4 0.3 - 1.2 mg/dL   Bilirubin, Direct 0.1 0.0 - 0.2 mg/dL   Indirect Bilirubin 0.3 0.3 - 0.9 mg/dL    Comment: Performed at [facility], [address] [place], [place] [address]  Lipase, blood     Status: None   Collection Time: 09/06/22 11:22 AM  Result Value Ref Range   Lipase 31 11 - 51 U/L    Comment: Performed at [facility], [address], [place], [place] [address]  Troponin I (High Sensitivity)     Status: None   Collection Time: 09/06/22 11:22 AM  Result Value Ref Range   Troponin I (High Sensitivity) 4 <18 ng/L    Comment: (NOTE) Elevated high sensitivity troponin I (hsTnI) values and significant  changes across serial measurements may suggest ACS but many other  chronic and acute conditions are known to elevate hsTnI results.  Refer to the "Links" section for chest pain algorithms and additional  guidance. Performed at [facility], [address] [place], [place] [address]   Troponin I (High Sensitivity)     Status: None   Collection Time: 09/06/22 12:35 PM  Result Value Ref Range   Troponin I (High Sensitivity) 4 <18 ng/L    Comment: (NOTE) Elevated high sensitivity troponin I (hsTnI) values and significant  changes across serial measurements may suggest ACS but many other  chronic and acute conditions are known to elevate hsTnI results.  Refer  to the "Links" section for chest pain algorithms and additional  guidance. Performed at [facility], [address], [place], [place] [address]   Lactic acid, plasma     Status: None   Collection Time: 09/06/22 12:35 PM  Result Value Ref Range   Lactic Acid, Venous 1.3 0.5 - 1.9 mmol/L    Comment: Performed at [facility], [address], [place], [place] [address]   DG Chest Portable 1 View  Result Date: 09/06/2022 CLINICAL DATA:  Syncope. EXAM: PORTABLE CHEST 1 VIEW COMPARISON:  None Available. FINDINGS: The heart size and mediastinal contours are within normal limits. 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 There is no evidence of pulmonary edema, consolidation, pneumothorax or pleural fluid. The visualized skeletal structures are unremarkable. 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 Electronically Signed   By: Irish Lack M.D.   On: 09/06/2022 12:12    Pending Labs Unresulted Labs (From admission, onward)     Start     Ordered   09/07/22 0500  CBC  Tomorrow morning,   R        09/06/22 1423   09/07/22 0500  Basic metabolic panel  Tomorrow morning,   R        09/06/22 1423  09/06/22 1423  Sedimentation rate  Add-on,   AD        09/06/22 1423   09/06/22 1423  C-reactive protein  Add-on,   AD        09/06/22 1423   09/06/22 1422  HIV Antibody (routine testing w rflx)  (HIV Antibody (Routine testing w reflex) panel)  Once,   R        09/06/22 1423   09/06/22 1323  TSH  Add-on,   AD        09/06/22 1322   09/06/22 1256  SARS Coronavirus 2 by RT PCR (hospital order, performed in [facility] lab) *cepheid single result test* Anterior Nasal Swab  (Tier 2 - SARS Coronavirus 2 by RT PCR (hospital order, performed in [facility] hospital lab) *cepheid single result test*)  Once,   URGENT        09/06/22 1255   09/06/22 1217  Lactic acid, plasma  Now then every 2 hours,   R (with STAT occurrences)       09/06/22 1216   09/06/22 1216  Blood culture (routine x 2)  BLOOD CULTURE X 2,   R (with STAT occurrences)      09/06/22 1215   09/06/22 0943  Urinalysis, Routine w reflex microscopic -Urine, Clean Catch  Once,   URGENT       Question:  Specimen Source  Answer:  Urine, Clean Catch   09/06/22 0942            Vitals/Pain Today's Vitals   09/06/22 0945 09/06/22 1030 09/06/22 1155 09/06/22 1422  BP: 110/60 127/69    Pulse: (!) 55 (!) 54    Resp: 16 11    Temp:   (!) 95 F (35 C) 99.1 F (37.3 C)  TempSrc:   Rectal Oral  SpO2: 100% 97%    Weight:      Height:      PainSc:        Isolation Precautions No active isolations  Medications Medications  vancomycin (VANCOREADY) IVPB 1500 mg/300 mL (has no administration in time range)  piperacillin-tazobactam (ZOSYN) IVPB 3.375 g (has no administration in time range)  vancomycin (VANCOREADY) IVPB 750 mg/150 mL (has no administration in time range)  enoxaparin (LOVENOX) injection 40 mg (has no administration in time range)  sodium chloride flush (NS) 0.9 % injection 3 mL (has no administration in time range)  acetaminophen (TYLENOL) tablet 650 mg (has no administration in time range)    Or  acetaminophen (TYLENOL) suppository 650 mg (has no administration in time range)  albuterol (PROVENTIL) (2.5 MG/3ML) 0.083% nebulizer solution 2.5 mg (has no administration in time range)  sodium chloride 0.9 % bolus 1,000 mL (0 mLs Intravenous Stopped 09/06/22 1130)  ondansetron (ZOFRAN) injection 4 mg (4 mg Intravenous Given 09/06/22 0957)  sodium chloride 0.9 % bolus 1,000 mL (1,000 mLs Intravenous New Bag/Given 09/06/22 1352)  piperacillin-tazobactam (ZOSYN) IVPB 3.375 g (3.375 g Intravenous New Bag/Given 09/06/22 1351)    Mobility walks     Focused Assessments Possible sepsis   R Recommendations: See Admitting Provider Note  Report given to:   Additional Notes:  AOX4, walky/talky, continent, lovely, 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

## 2022-09-06 NOTE — Progress Notes (Signed)
Pharmacy Antibiotic Note  Sandra Clay is a 67 y.o. female admitted on 09/06/2022 presenting with weakness, concern for sepsis.  Pharmacy has been consulted for vancomycin and zosyn dosing.  Plan: Vancomycin 1500 mg IV x 1, then 750 mg IV q 12h (eAUC 541) Zosyn 3.375 G IV every 8 hours (Extended 4h infusion) Monitor renal function, Cx and clinical progression Vancomycin levels as indicated  Height: 5\' 6"  (167.6 cm) Weight: 68.8 kg (151 lb 9.6 oz) IBW/kg (Calculated) : 59.3  Temp (24hrs), Avg:95 F (35 C), Min:95 F (35 C), Max:95 F (35 C)  Recent Labs  Lab 09/06/22 0949 09/06/22 1001 09/06/22 1122  WBC 5.1  --   --   CREATININE  --  0.80 0.69    Estimated Creatinine Clearance: 62.1 mL/min (by C-G formula based on SCr of 0.69 mg/dL).    Allergies  Allergen Reactions   Dilaudid [Hydromorphone] Itching   Sulfa Antibiotics Rash    Daylene Posey, PharmD, [facility] Clinical Pharmacist ED Pharmacist Phone # [phone] 09/06/2022 1:25 PM

## 2022-09-06 NOTE — ED Provider Notes (Signed)
[place] EMERGENCY DEPARTMENT AT [facility] Provider Note   CSN: [id number] Arrival date & time: 09/06/22  6213     History  Chief Complaint  Patient presents with   Weakness    Sandra Clay is a 67 y.o. female presenting to the emergency department with complaint of sudden generalized weakness.  The patient ports she woke up in her usual state of health, was getting ready for dance routine around 8 AM this morning, and began to feel abruptly lightheaded and generally weak.  She said "I just got all over and felt like it was going to pass out".  Her daughter at the bedside reports that they helped ease the patient to the ground, and there was a period where the patient was "really pale and white and she was not responding".  EMS was called and brought the patient into the hospital.  The patient reports that she does feel nauseated and lightheaded.  She still feels "hot all over".  She denies chest pain or pressure.  She denies difficulty breathing.  She denies any prior history of syncope.  She did have a history of a small stroke, for which she is currently taking aspirin.  She also had a PFO that was closed with a device with cardiology last year.  She denies history of DVT or PE.  The patient is here with her husband and her daughter at bedside  HPI     Home Medications Prior to Admission medications   Medication Sig Start Date End Date Taking? Authorizing Provider  aspirin EC 81 MG tablet Take 81 mg by mouth in the morning. Swallow whole.    [provider]  estradiol (ESTRACE) 0.1 MG/GM vaginal cream Place 0.5 grams vaginally 2 times a week at bedtime. 01/14/22     meloxicam (MOBIC) 15 MG tablet Take 1 tablet (15 mg total) by mouth daily with a meal 08/16/22     predniSONE (DELTASONE) 10 MG tablet Take 6 tablets by mouth for 2 days then take 5 tablets for 2 days then take 4 tablets for 2 days then take 3 tablets for 2 days then take 2 tablets for 2 days then  take 1 tablet for 2 days. 08/16/22     rosuvastatin (CRESTOR) 20 MG tablet TAKE 1 TABLET BY MOUTH ONCE A DAY 07/25/22 07/25/23  Ardith Dark, MD  valACYclovir (VALTREX) 1000 MG tablet Take 1 tablet (1,000 mg total) by mouth daily. 01/14/22         Allergies    Dilaudid [hydromorphone] and Sulfa antibiotics    Review of Systems   Review of Systems  Physical Exam Updated Vital Signs BP 127/65 (BP Location: Right Arm)   Pulse 79   Temp 98.4 F (36.9 C) (Oral)   Resp 13   Ht 5\' 6"  (1.676 m)   Wt 74 kg   SpO2 97%   BMI 26.33 kg/m  Physical Exam Constitutional:      General: She is not in acute distress. HENT:     Head: Normocephalic and atraumatic.  Eyes:     Conjunctiva/sclera: Conjunctivae normal.     Pupils: Pupils are equal, round, and reactive to light.  Cardiovascular:     Rate and Rhythm: Regular rhythm. Bradycardia present.  Pulmonary:     Effort: Pulmonary effort is normal. No respiratory distress.     Breath sounds: Normal breath sounds.  Abdominal:     General: There is no distension.     Tenderness:  There is no abdominal tenderness.  Skin:    General: Skin is warm and dry.  Neurological:     General: No focal deficit present.     Mental Status: She is alert and oriented to person, place, and time. Mental status is at baseline.  Psychiatric:        Mood and Affect: Mood normal.        Behavior: Behavior normal.     ED Results / Procedures / Treatments   Labs (all labs ordered are listed, but only abnormal results are displayed) Labs Reviewed  CBC WITH DIFFERENTIAL/PLATELET - Abnormal; Notable for the following components:      Result Value   RBC 3.82 (*)    All other components within normal limits  URINALYSIS, ROUTINE W REFLEX MICROSCOPIC - Abnormal; Notable for the following components:   Ketones, ur 5 (*)    Leukocytes,Ua TRACE (*)    All other components within normal limits  BASIC METABOLIC PANEL - Abnormal; Notable for the following components:    Glucose, Bld 110 (*)    Calcium 8.6 (*)    All other components within normal limits  HEPATIC FUNCTION PANEL - Abnormal; Notable for the following components:   Total Protein 6.0 (*)    Albumin 3.4 (*)    All other components within normal limits  CBG MONITORING, ED - Abnormal; Notable for the following components:   Glucose-Capillary 130 (*)    All other components within normal limits  I-STAT CHEM 8, ED - Abnormal; Notable for the following components:   BUN 29 (*)    Glucose, Bld 140 (*)    Calcium, Ion 1.09 (*)    Hemoglobin 11.9 (*)    HCT 35.0 (*)    All other components within normal limits  SARS CORONAVIRUS 2 BY RT PCR  CULTURE, BLOOD (ROUTINE X 2)  CULTURE, BLOOD (ROUTINE X 2)  D-DIMER, QUANTITATIVE  LIPASE, BLOOD  LACTIC ACID, PLASMA  LACTIC ACID, PLASMA  C-REACTIVE PROTEIN  TSH  HIV ANTIBODY (ROUTINE TESTING W REFLEX)  SEDIMENTATION RATE  CBC  BASIC METABOLIC PANEL  TROPONIN I (HIGH SENSITIVITY)  TROPONIN I (HIGH SENSITIVITY)    EKG EKG Interpretation  Date/Time:  Friday September 06 2022 10:20:29 EDT Ventricular Rate:  59 PR Interval:  194 QRS Duration: 113 QT Interval:  498 QTC Calculation: 494 R Axis:   73 Text Interpretation: Sinus rhythm Borderline intraventricular conduction delay Borderline prolonged QT interval Confirmed by Alvester Chou [phone]) on 09/06/2022 10:22:41 AM  Radiology DG Chest Portable 1 View  Result Date: 09/06/2022 CLINICAL DATA:  Syncope. EXAM: PORTABLE CHEST 1 VIEW COMPARISON:  None Available. FINDINGS: The heart size and mediastinal contours are within normal limits. 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 There is no evidence of pulmonary edema, consolidation, pneumothorax or pleural fluid. The visualized skeletal structures are unremarkable. 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 Electronically Signed   By: Irish Lack M.D.   On: 09/06/2022 12:12     Procedures Procedures    Medications Ordered in ED Medications  vancomycin (VANCOREADY) IVPB 1500 mg/300 mL (1,500 mg Intravenous New Bag/Given 09/06/22 1638)  piperacillin-tazobactam (ZOSYN) IVPB 3.375 g (has no administration in time range)  vancomycin (VANCOREADY) IVPB 750 mg/150 mL (has no administration in time range)  enoxaparin (LOVENOX) injection 40 mg (has no administration in time range)  sodium chloride flush (NS) 0.9 % injection 3 mL (3 mLs Intravenous Given 09/06/22 1641)  acetaminophen (TYLENOL) tablet 650 mg (has no  administration in time range)    Or  acetaminophen (TYLENOL) suppository 650 mg (has no administration in time range)  albuterol (PROVENTIL) (2.5 MG/3ML) 0.083% nebulizer solution 2.5 mg (has no administration in time range)  sodium chloride 0.9 % bolus 1,000 mL (0 mLs Intravenous Stopped 09/06/22 1130)  ondansetron (ZOFRAN) injection 4 mg (4 mg Intravenous Given 09/06/22 0957)  sodium chloride 0.9 % bolus 1,000 mL (1,000 mLs Intravenous New Bag/Given 09/06/22 1352)  piperacillin-tazobactam (ZOSYN) IVPB 3.375 g (3.375 g Intravenous New Bag/Given 09/06/22 1351)    ED Course/ Medical Decision Making/ A&P Clinical Course as of 09/06/22 1708  Fri Sep 06, 2022  0954 Patient vomited once, with diarrhea [MT]  1255 No further vomiting or diarrhea.  With the patient's rectal hypothermia temp, I do think an infection workup is reasonable.  Will send blood cultures.  Broad-spectrum antibiotics ordered as well as sepsis fluid bolus.  No clear source yet.  Awaiting UA [MT]  1256 She is feeling much better now in the ED, HR 70's [MT]  1325 Admitted to hospitalist [MT]    Clinical Course User Index [MT] Jailyne Chieffo, Kermit Balo, MD                             Medical Decision Making Amount and/or Complexity of Data Reviewed Labs: ordered. Radiology: ordered. ECG/medicine tests: ordered.  Risk Prescription drug management. Decision regarding hospitalization.   This  patient presents to the ED with concern for syncope versus near syncope. This involves an extensive number of treatment options, and is a complaint that carries with it a high risk of complications and morbidity.  The differential diagnosis includes vasovagal episode versus arrhythmia versus pulmonary embolism versus anemia vs dehydration vs other  Additional history obtained from family members at bedside  External records from outside source obtained and reviewed including echo bubble study 06/27/21 showing "no shunting and stable placement of device" per Georgie Chard NP report  I ordered and personally interpreted labs.  The pertinent results include:  lactate wnl, wbc wnl; covid negative; UA trace leuks, neg nitrites, torponins negative; BMP and CBC unremarkable  I ordered imaging studies including dg chest I independently visualized and interpreted imaging which showed no emergent findings I agree with the radiologist interpretation  The patient was maintained on a cardiac monitor.  I personally viewed and interpreted the cardiac monitored which showed an underlying rhythm of: sinus bradycardia and sinus rhythm  Per my interpretation the patient's ECG shows sinus rhythm without acute ischemic findings  I ordered medication including IV fluids and zofran for nausea and borderline hypotension.  BS antibiotics for hypothermia/infection/bacteremia coverage  I have reviewed the patients home medicines and have made adjustments as needed  Test Considered: Patient is not having any headache; MRA head and neck Feb 2022 without report of aneurysm; I have low suspicion for SAH/aneurysm or CVA for emergent neuroimaging; doubt meningitis  After the interventions noted above, I reevaluated the patient and found that they have: improved   Dispostion:  After consideration of the diagnostic results and the patients response to treatment, I feel that the patent would benefit from medical  admission.         Final Clinical Impression(s) / ED Diagnoses Final diagnoses:  Hypothermia, initial encounter  Syncope, unspecified syncope type    Rx / DC Orders ED Discharge Orders     None         Shmuel Girgis, Kermit Balo, MD  09/06/22 1709  

## 2022-09-06 NOTE — ED Triage Notes (Signed)
PT BIB EMS from a dance studio, states all of a sudden she just felt weak and nauseous, pt was pale and diaphoretic, did not pass out but was assisted to the floor.   90/ palp Hr 60 141 CBG  18 g L AC 400 mdf NS

## 2022-09-07 ENCOUNTER — Other Ambulatory Visit (HOSPITAL_COMMUNITY): Payer: Self-pay

## 2022-09-07 ENCOUNTER — Observation Stay (HOSPITAL_BASED_OUTPATIENT_CLINIC_OR_DEPARTMENT_OTHER): Payer: Commercial Managed Care - PPO

## 2022-09-07 ENCOUNTER — Other Ambulatory Visit: Payer: Self-pay | Admitting: Cardiology

## 2022-09-07 DIAGNOSIS — R55 Syncope and collapse: Secondary | ICD-10-CM

## 2022-09-07 LAB — ECHOCARDIOGRAM COMPLETE BUBBLE STUDY
AR max vel: 2.2 cm2
AV Area VTI: 2.59 cm2
AV Area mean vel: 2.24 cm2
AV Mean grad: 3 mmHg
AV Peak grad: 4.8 mmHg
Ao pk vel: 1.1 m/s
Area-P 1/2: 3.19 cm2
S' Lateral: 3.7 cm

## 2022-09-07 LAB — BASIC METABOLIC PANEL
Anion gap: 6 (ref 5–15)
BUN: 14 mg/dL (ref 8–23)
CO2: 24 mmol/L (ref 22–32)
Calcium: 8.3 mg/dL — ABNORMAL LOW (ref 8.9–10.3)
Chloride: 112 mmol/L — ABNORMAL HIGH (ref 98–111)
Creatinine, Ser: 0.7 mg/dL (ref 0.44–1.00)
GFR, Estimated: >60 mL/min (ref >60)
Glucose, Bld: 97 mg/dL (ref 70–99)
Potassium: 3.6 mmol/L (ref 3.5–5.1)
Sodium: 142 mmol/L (ref 135–145)

## 2022-09-07 LAB — CBC
HCT: 35.4 % — ABNORMAL LOW (ref 36.0–46.0)
Hemoglobin: 11.9 g/dL — ABNORMAL LOW (ref 12.0–15.0)
MCH: 33.1 pg (ref 26.0–34.0)
MCHC: 33.6 g/dL (ref 30.0–36.0)
MCV: 98.3 fL (ref 80.0–100.0)
Platelets: 176 10*3/uL (ref 150–400)
RBC: 3.6 MIL/uL — ABNORMAL LOW (ref 3.87–5.11)
RDW: 14.4 % (ref 11.5–15.5)
WBC: 6.5 10*3/uL (ref 4.0–10.5)
nRBC: 0 % (ref 0.0–0.2)

## 2022-09-07 LAB — MAGNESIUM: Magnesium: 1.9 mg/dL (ref 1.7–2.4)

## 2022-09-07 LAB — CULTURE, BLOOD (ROUTINE X 2): Culture: NO GROWTH

## 2022-09-07 LAB — C-REACTIVE PROTEIN: CRP: 0.6 mg/dL (ref <1.0)

## 2022-09-07 LAB — CORTISOL: Cortisol, Plasma: 13.1 ug/dL

## 2022-09-07 LAB — PROCALCITONIN: Procalcitonin: <0.1 ng/mL

## 2022-09-07 MED ORDER — POTASSIUM CHLORIDE CRYS ER 20 MEQ PO TBCR
40.0000 meq | EXTENDED_RELEASE_TABLET | Freq: Once | ORAL | Status: AC
  Administered 2022-09-07: 40 meq via ORAL
  Filled 2022-09-07: qty 2

## 2022-09-07 MED ORDER — CALCIUM GLUCONATE-NACL 1-0.675 GM/50ML-% IV SOLN
1.0000 g | Freq: Once | INTRAVENOUS | Status: AC
  Administered 2022-09-07: 1000 mg via INTRAVENOUS
  Filled 2022-09-07: qty 50

## 2022-09-07 MED ORDER — METOPROLOL TARTRATE 25 MG PO TABS
25.0000 mg | ORAL_TABLET | Freq: Two times a day (BID) | ORAL | Status: DC
  Administered 2022-09-07: 25 mg via ORAL
  Filled 2022-09-07: qty 1

## 2022-09-07 MED ORDER — METOPROLOL TARTRATE 25 MG PO TABS
25.0000 mg | ORAL_TABLET | Freq: Two times a day (BID) | ORAL | 0 refills | Status: DC
  Filled 2022-09-07: qty 60, 30d supply, fill #0

## 2022-09-07 NOTE — Progress Notes (Signed)
Nsg Discharge Note  Admit Date:  09/06/2022 Discharge date: 09/07/2022   [name] to be D/C'd Home per MD order.  AVS completed. Patient/caregiver able to verbalize understanding.  Discharge Medication: Allergies as of 09/07/2022       Reactions   Dilaudid [hydromorphone] Itching   Sulfa Antibiotics Rash        Medication List     TAKE these medications    aspirin EC 81 MG tablet Take 81 mg by mouth in the morning. Swallow whole.   estradiol 0.1 MG/GM vaginal cream Commonly known as: ESTRACE Place 0.5 grams vaginally 2 times a week at bedtime. What changed: how much to take   meloxicam 15 MG tablet Commonly known as: MOBIC Take 1 tablet (15 mg total) by mouth daily with a meal   metoprolol tartrate 25 MG tablet Commonly known as: LOPRESSOR Take 1 tablet (25 mg total) by mouth 2 (two) times daily.   rosuvastatin 20 MG tablet Commonly known as: CRESTOR TAKE 1 TABLET BY MOUTH ONCE A DAY What changed: how much to take   valACYclovir 1000 MG tablet Commonly known as: VALTREX Take 1 tablet (1,000 mg total) by mouth daily.   VITAMIN D PO Take 1 capsule by mouth daily.        Discharge Assessment: Vitals:   09/07/22 0419 09/07/22 1245  BP: 134/70 137/73  Pulse: 62   Resp: 13   Temp: 98.5 F (36.9 C)   SpO2: 96%    Skin clean, dry and intact without evidence of skin break down, no evidence of skin tears noted. IV catheter discontinued intact. Site without signs and symptoms of complications - no redness or edema noted at insertion site, patient denies c/o pain - only slight tenderness at site.  Dressing with slight pressure applied.  D/c Instructions-Education: Discharge instructions given to patient/family with verbalized understanding. D/c education completed with patient/family including follow up instructions, medication list, d/c activities limitations if indicated, with other d/c instructions as indicated by MD - patient able to verbalize  understanding, all questions fully answered. Patient instructed to return to ED, call 911, or call MD for any changes in condition.  Patient escorted via WC, and D/C home via private auto.  Kizzie Bane, RN 09/07/2022 1:06 PM

## 2022-09-07 NOTE — Evaluation (Signed)
Physical Therapy Evaluation Patient Details Name: Sandra Clay MRN: [id number] DOB: 01/24/1956 Today's Date: 09/07/2022  History of Present Illness  Pt is a 67 y.o. female admitted 6/21 with dx of syncope. PMH: CVA in 03/2020, PFO s/p closure, anxiety, and depression   Clinical Impression  PT eval complete. Pt is independent with all functional mobility. Pt reports feeling back to baseline. No further skilled PT intervention indicated. PT signing off.                                                 BP Supine                              140/74 Sitting                               135/72 Initial stand                      148/71 Stand after 3 min             143/76 EOB after amb                  136/73      Recommendations for follow up therapy are one component of a multi-disciplinary discharge planning process, led by the attending physician.  Recommendations may be updated based on patient status, additional functional criteria and insurance authorization.  Follow Up Recommendations       Assistance Recommended at Discharge    Patient can return home with the following       Equipment Recommendations None recommended by PT  Recommendations for Other Services       Functional Status Assessment Patient has not had a recent decline in their functional status     Precautions / Restrictions Precautions Precautions: None      Mobility  Bed Mobility Overal bed mobility: Independent                  Transfers Overall transfer level: Independent Equipment used: None                    Ambulation/Gait Ambulation/Gait assistance: Independent Gait Distance (Feet): 500 Feet Assistive device: None Gait Pattern/deviations: WFL(Within Functional Limits)   Gait velocity interpretation: >4.37 ft/sec, indicative of normal walking speed      Stairs            Wheelchair Mobility    Modified Rankin (Stroke Patients Only)       Balance Overall  balance assessment: Independent                                           Pertinent Vitals/Pain Pain Assessment Pain Assessment: No/denies pain    Home Living Family/patient expects to be discharged to:: Private residence Living Arrangements: Spouse/significant other Available Help at Discharge: Family;Available 24 hours/day Type of Home: House Home Access: Stairs to enter Entrance Stairs-Rails: None Entrance Stairs-Number of Steps: 3   Home Layout: Laundry or work area in [employer]: None      Prior Function Prior Level of Function : Independent/Modified  Independent;Working/employed;Driving                     Hand Dominance        Extremity/Trunk Assessment   Upper Extremity Assessment Upper Extremity Assessment: Overall WFL for tasks assessed    Lower Extremity Assessment Lower Extremity Assessment: Overall WFL for tasks assessed    Cervical / Trunk Assessment Cervical / Trunk Assessment: Normal  Communication   Communication: No difficulties  Cognition Arousal/Alertness: Awake/alert Behavior During Therapy: WFL for tasks assessed/performed Overall Cognitive Status: Within Functional Limits for tasks assessed                                          General Comments      Exercises     Assessment/Plan    PT Assessment Patient does not need any further PT services  PT Problem List         PT Treatment Interventions      PT Goals (Current goals can be found in the Care Plan section)  Acute Rehab PT Goals Patient Stated Goal: home PT Goal Formulation: All assessment and education complete, DC therapy    Frequency       Co-evaluation               AM-PAC PT "6 Clicks" Mobility  Outcome Measure Help needed turning from your back to your side while in a flat bed without using bedrails?: None Help needed moving from lying on your back to sitting on the side of a flat bed without using  bedrails?: None Help needed moving to and from a bed to a chair (including a wheelchair)?: None Help needed standing up from a chair using your arms (e.g., wheelchair or bedside chair)?: None Help needed to walk in hospital room?: None Help needed climbing 3-5 steps with a railing? : None 6 Click Score: 24    End of Session   Activity Tolerance: Patient tolerated treatment well Patient left: in bed;with call bell/phone within reach Nurse Communication: Mobility status PT Visit Diagnosis: Difficulty in walking, not elsewhere classified (R26.2)    Time: 1610-9604 PT Time Calculation (min) (ACUTE ONLY): 24 min   Charges:   PT Evaluation $PT Eval Low Complexity: 1 Low          Ferd Glassing., PT  Office # [phone]   Ilda Foil 09/07/2022, 8:45 AM

## 2022-09-07 NOTE — Progress Notes (Signed)
   30 day event monitor requested by [facility] hospitalist team 2/2 syncope, concern for vasovagal etiology. Results to Dr. Excell Seltzer.  Perlie Gold, PA-C

## 2022-09-07 NOTE — Progress Notes (Signed)
OT Cancellation Note  Patient Details Name: Sandra Clay MRN: [id number] DOB: February 26, 1956   Cancelled Treatment:    Reason Eval/Treat Not Completed: OT screened, no needs identified, will sign off. Pt evaluated by PT who reports pt is independent and has no needs at this time. OT will sign off, thank you for referral.   Rosey Bath OTR/L Acute Rehabilitation Services Office: [phone]   Rebeca Alert 09/07/2022, 8:41 AM

## 2022-09-07 NOTE — Discharge Instructions (Addendum)
Follow with Primary MD Ardith Dark, MD in 7 days   Get CBC, CMP, magnesium, Ionized calcium -  checked next visit with your primary MD    Activity: As tolerated with Full fall precautions use walker/cane & assistance as needed  Disposition Home    Diet: Heart Healthy   Special Instructions: If you have smoked or chewed Tobacco  in the last 2 yrs please stop smoking, stop any regular Alcohol  and or any Recreational drug use.  On your next visit with your primary care physician please Get Medicines reviewed and adjusted.  Please request your Prim.MD to go over all Hospital Tests and Procedure/Radiological results at the follow up, please get all Hospital records sent to your Prim MD by signing hospital release before you go home.  If you experience worsening of your admission symptoms, develop shortness of breath, life threatening emergency, suicidal or homicidal thoughts you must seek medical attention immediately by calling 911 or calling your MD immediately  if symptoms less severe.  You Must read complete instructions/literature along with all the possible adverse reactions/side effects for all the Medicines you take and that have been prescribed to you. Take any new Medicines after you have completely understood and accpet all the possible adverse reactions/side effects.   Do not drive when taking Pain medications.  Do not take more than prescribed Pain, Sleep and Anxiety Medications

## 2022-09-07 NOTE — Discharge Summary (Signed)
Sandra Clay SEG:[id number] DOB: 31-May-1955 DOA: 09/06/2022  PCP: Ardith Dark, MD  Admit date: 09/06/2022  Discharge date: 09/07/2022  Admitted From: Home   Disposition:  Home   Recommendations for Outpatient Follow-up:   Follow up with PCP in 1-2 weeks  PCP Please obtain BMP/CBC, 2 view CXR in 1week,  (see Discharge instructions)   PCP Please follow up on the following pending results: Monitor orthostatics, monitor syncope symptoms, needs outpatient cardiology follow-up and 30-day event monitor   Home Health: None   Equipment/Devices: None  Consultations: None  Discharge Condition: Stable    CODE STATUS: Full    Diet Recommendation: Heart Healthy     Chief Complaint  Patient presents with   Weakness     Brief history of present illness from the day of admission and additional interim summary    67 y.o. female with medical history significant of  CVA in 03/2020, PFO s/p closure, anxiety, and depression who presents after probably having a syncopal episode.  She had been doing ballroom dancing and was getting ready for upcoming performance this morning when this occurred.  She was  about to get her hair and make-up done when she all of a sudden felt unwell, got lightheaded and subsequently passed out for a few minutes.  No seizure-like activity or bowel or bladder incontinence, she claims that her blood pressure did fall quite a bit.  Also claims that she has had intentional 25 point weight loss with good diet and exercise, was brought to the ER and admitted for further syncope workup.                                                                 Hospital Course   Syncope/near syncope  This sounds like orthostatic hypotension in the setting of clinical dehydration, she had low blood pressure at the time of  this episode, after hydration she is completely symptom-free, not orthostatic, stable TSH and random cortisol, stable on telemetry except for frequent PVCs, electrolytes stable except mild hypocalcemia, placed on low-dose beta-blocker, repeat echocardiogram stable, 30-day event monitor arranged via cardiology.  Did well with PT and symptom-free, will be discharged home with close outpatient follow-up with PCP and her primary cardiologist in a week.  Mild Hypocalcemia - replaced, PCP to monitor.   Hypothermia -  likely due to sweating-dehydration and exposure, resolved, stable TSH and Cortisol, no signs of infection.   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   Hyperlipidemia - Continue Crestor   HSV - Patient chronically on Valtrex.     Discharge diagnosis     Principal Problem:   Syncope Active Problems:   Hypothermia   PFO (patent foramen  ovale)   Hyperlipidemia   HSV (herpes simplex virus) infection    Discharge instructions    Discharge Instructions     Diet - low sodium heart healthy   Complete by: As directed    Discharge instructions   Complete by: As directed    Follow with Primary MD Ardith Dark, MD in 7 days   Get CBC, CMP, magnesium, Ionized calcium -  checked next visit with your primary MD    Activity: As tolerated with Full fall precautions use walker/cane & assistance as needed  Disposition Home    Diet: Heart Healthy   Special Instructions: If you have smoked or chewed Tobacco  in the last 2 yrs please stop smoking, stop any regular Alcohol  and or any Recreational drug use.  On your next visit with your primary care physician please Get Medicines reviewed and adjusted.  Please request your Prim.MD to go over all Hospital Tests and Procedure/Radiological results at the follow up, please get all Hospital records sent to your  Prim MD by signing hospital release before you go home.  If you experience worsening of your admission symptoms, develop shortness of breath, life threatening emergency, suicidal or homicidal thoughts you must seek medical attention immediately by calling 911 or calling your MD immediately  if symptoms less severe.  You Must read complete instructions/literature along with all the possible adverse reactions/side effects for all the Medicines you take and that have been prescribed to you. Take any new Medicines after you have completely understood and accpet all the possible adverse reactions/side effects.   Do not drive when taking Pain medications.  Do not take more than prescribed Pain, Sleep and Anxiety Medications   Increase activity slowly   Complete by: As directed        Discharge Medications   Allergies as of 09/07/2022       Reactions   Dilaudid [hydromorphone] Itching   Sulfa Antibiotics Rash        Medication List     TAKE these medications    aspirin EC 81 MG tablet Take 81 mg by mouth in the morning. Swallow whole.   estradiol 0.1 MG/GM vaginal cream Commonly known as: ESTRACE Place 0.5 grams vaginally 2 times a week at bedtime. What changed: how much to take   meloxicam 15 MG tablet Commonly known as: MOBIC Take 1 tablet (15 mg total) by mouth daily with a meal   metoprolol tartrate 25 MG tablet Commonly known as: LOPRESSOR Take 1 tablet (25 mg total) by mouth 2 (two) times daily.   rosuvastatin 20 MG tablet Commonly known as: CRESTOR TAKE 1 TABLET BY MOUTH ONCE A DAY What changed: how much to take   valACYclovir 1000 MG tablet Commonly known as: VALTREX Take 1 tablet (1,000 mg total) by mouth daily.   VITAMIN D PO Take 1 capsule by mouth daily.         Follow-up Information     Ardith Dark, MD. Schedule an appointment as soon as possible for a visit in 1 week(s).   Specialty: Family Medicine Contact information: [address] [place] [address] [phone]         Tonny Bollman, MD. Schedule an appointment as soon as possible for a visit in 1 week(s).   Specialty: Cardiology Why: syncope Contact information: [address] [place] [address] [phone]                 Major  procedures and Radiology Reports - PLEASE review detailed and final reports thoroughly  -        ECHOCARDIOGRAM COMPLETE BUBBLE STUDY  Result Date: 09/07/2022    ECHOCARDIOGRAM REPORT   Patient Name:   AIMEE HELDMAN [facility] Date of Exam: 09/07/2022 Medical Rec #:  [id number]   Height:       66.0 in Accession #:    [id number]  Weight:       163.1 lb Date of Birth:  25-Sep-1955   BSA:          1.834 m Patient Age:    67 years    BP:           134/70 mmHg Patient Gender: F           HR:           67 bpm. Exam Location:  Inpatient Procedure: 2D Echo, Cardiac Doppler, Color Doppler and Saline Contrast Bubble            Study Indications:    Syncope  History:        Patient has prior history of Echocardiogram examinations, most                 recent 06/30/2020. Stroke.  Sonographer:    Darlys Gales Referring Phys: Effie Shy Alexine Pilant K [facility] IMPRESSIONS  1. Left ventricular ejection fraction, by estimation, is 55 to 60%. The left ventricle has normal function. The left ventricle has no regional wall motion abnormalities. Left ventricular diastolic parameters were normal.  2. Right ventricular systolic function is normal. The right ventricular size is normal. Tricuspid regurgitation signal is inadequate for assessing PA pressure.  3. The mitral valve is normal in structure. Mild mitral valve regurgitation.  4. The aortic valve is tricuspid. Aortic valve regurgitation is not visualized. No aortic stenosis is present.  5. Agitated saline contrast bubble study was negative, with no evidence of any interatrial shunt. Comparison(s): No significant change from prior study. Prior images reviewed side by side. Previous TEE in 2022  demonstrated PFO with bidirectional shunt, but no shunt is seen on current or previous transthoracic studies. FINDINGS  Left Ventricle: Left ventricular ejection fraction, by estimation, is 55 to 60%. The left ventricle has normal function. The left ventricle has no regional wall motion abnormalities. The left ventricular internal cavity size was normal in size. There is  no left ventricular hypertrophy. Left ventricular diastolic parameters were normal. Right Ventricle: The right ventricular size is normal. No increase in right ventricular wall thickness. Right ventricular systolic function is normal. Tricuspid regurgitation signal is inadequate for assessing PA pressure. Left Atrium: Left atrial size was normal in size. Right Atrium: Right atrial size was normal in size. Pericardium: There is no evidence of pericardial effusion. Mitral Valve: The mitral valve is normal in structure. Mild mitral valve regurgitation, with centrally-directed jet. Tricuspid Valve: The tricuspid valve is normal in structure. Tricuspid valve regurgitation is trivial. Aortic Valve: The aortic valve is tricuspid. Aortic valve regurgitation is not visualized. No aortic stenosis is present. Aortic valve mean gradient measures 3.0 mmHg. Aortic valve peak gradient measures 4.8 mmHg. Aortic valve area, by VTI measures 2.59 cm. Pulmonic Valve: The pulmonic valve was grossly normal. Pulmonic valve regurgitation is not visualized. Aorta: The aortic root and ascending aorta are structurally normal, with no evidence of dilitation. IAS/Shunts: No atrial level shunt detected by color flow Doppler. Agitated saline contrast was given intravenously to evaluate for intracardiac shunting. Agitated saline contrast bubble study was  negative, with no evidence of any interatrial shunt.  LEFT VENTRICLE PLAX 2D LVIDd:         5.00 cm   Diastology LVIDs:         3.70 cm   LV e' medial:    10.00 cm/s LV PW:         0.90 cm   LV E/e' medial:  6.7 LV IVS:         1.00 cm   LV e' lateral:   9.14 cm/s LVOT diam:     1.80 cm   LV E/e' lateral: 7.3 LV SV:         59 LV SV Index:   32 LVOT Area:     2.54 cm  RIGHT VENTRICLE             IVC RV S prime:     10.80 cm/s  IVC diam: 1.80 cm TAPSE (M-mode): 2.2 cm LEFT ATRIUM             Index        RIGHT ATRIUM           Index LA Vol (A2C):   32.1 ml 17.50 ml/m  RA Area:     17.50 cm LA Vol (A4C):   62.6 ml 34.13 ml/m  RA Volume:   46.40 ml  25.30 ml/m LA Biplane Vol: 47.7 ml 26.01 ml/m  AORTIC VALVE AV Area (Vmax):    2.20 cm AV Area (Vmean):   2.24 cm AV Area (VTI):     2.59 cm AV Vmax:           110.00 cm/s AV Vmean:          78.400 cm/s AV VTI:            0.226 m AV Peak Grad:      4.8 mmHg AV Mean Grad:      3.0 mmHg LVOT Vmax:         95.00 cm/s LVOT Vmean:        69.000 cm/s LVOT VTI:          0.230 m LVOT/AV VTI ratio: 1.02  AORTA Ao Root diam: 3.00 cm Ao Asc diam:  2.80 cm MITRAL VALVE               TRICUSPID VALVE MV Area (PHT): 3.19 cm    TR Peak grad:   16.6 mmHg MV Decel Time: 238 msec    TR Vmax:        204.00 cm/s MV E velocity: 67.10 cm/s MV A velocity: 82.80 cm/s  SHUNTS MV E/A ratio:  0.81        Systemic VTI:  0.23 m                            Systemic Diam: 1.80 cm Rachelle Hora Croitoru MD Electronically signed by Thurmon Fair MD Signature Date/Time: 09/07/2022/12:01:41 PM    Final    DG Chest Portable 1 View  Result Date: 09/06/2022 CLINICAL DATA:  Syncope. EXAM: PORTABLE CHEST 1 VIEW COMPARISON:  None Available. FINDINGS: The heart size and mediastinal contours are within normal limits. 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 There is no evidence of pulmonary edema, consolidation, pneumothorax or pleural fluid. The visualized skeletal structures are unremarkable. 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 Electronically Signed   By: Irish Lack M.D.   On: 09/06/2022 12:12    Micro Results  Recent Results (from the past 240 hour(s))  Blood  culture (routine x 2)     Status: None (Preliminary result)   Collection Time: 09/06/22 12:16 PM   Specimen: BLOOD  Result Value Ref Range Status   Specimen Description BLOOD SITE NOT SPECIFIED  Final   Special Requests   Final    BOTTLES DRAWN AEROBIC AND ANAEROBIC Blood Culture results may not be optimal due to an excessive volume of blood received in culture bottles   Culture   Final    NO GROWTH < 24 HOURS Performed at [facility], [address], [place], [place] [address]    Report Status PENDING  Incomplete  Blood culture (routine x 2)     Status: None (Preliminary result)   Collection Time: 09/06/22 12:21 PM   Specimen: BLOOD  Result Value Ref Range Status   Specimen Description BLOOD SITE NOT SPECIFIED  Final   Special Requests   Final    BOTTLES DRAWN AEROBIC AND ANAEROBIC Blood Culture adequate volume   Culture   Final    NO GROWTH < 24 HOURS Performed at [facility], [address] [place], [place] [address]    Report Status PENDING  Incomplete  SARS Coronavirus 2 by RT PCR (hospital order, performed in [facility] hospital lab) *cepheid single result test* Anterior Nasal Swab     Status: None   Collection Time: 09/06/22 12:56 PM   Specimen: Anterior Nasal Swab  Result Value Ref Range Status   SARS Coronavirus 2 by RT PCR NEGATIVE NEGATIVE Final    Comment: Performed at [facility], [address] [place], [place] [address]    Today   Subjective    Sandra Clay today has no headache,no chest abdominal pain,no new weakness tingling or numbness, feels much better wants to go home today.     Objective   Blood pressure 134/70, pulse 62, temperature 98.5 F (36.9 C), temperature source Tympanic, resp. rate 13, height 5\' 6"  (1.676 m), weight 74 kg, SpO2 96 %.  No intake or output data in the 24 hours ending 09/07/22 1220  Exam  Awake Alert, No new F.N deficits,    [place].AT,PERRAL Supple Neck,   Symmetrical Chest wall movement, Good air  movement bilaterally, CTAB RRR,No Gallops,   +ve B.Sounds, Abd Soft, Non tender,  No Cyanosis, Clubbing or edema    Data Review   Recent Labs  Lab 09/06/22 0949 09/06/22 1001 09/07/22 0313  WBC 5.1  --  6.5  HGB 12.2 11.9* 11.9*  HCT 37.0 35.0* 35.4*  PLT 167  --  176  MCV 96.9  --  98.3  MCH 31.9  --  33.1  MCHC 33.0  --  33.6  RDW 14.1  --  14.4  LYMPHSABS 1.1  --   --   MONOABS 0.3  --   --   EOSABS 0.1  --   --   BASOSABS 0.0  --   --     Recent Labs  Lab 09/06/22 0949 09/06/22 1001 09/06/22 1122 09/06/22 1235 09/06/22 1604 09/07/22 0313 09/07/22 0641  NA  --  141 140  --   --  142  --   K  --  4.6 4.2  --   --  3.6  --   CL  --  106 107  --   --  112*  --   CO2  --   --  24  --   --  24  --   ANIONGAP  --   --  9  --   --  6  --   GLUCOSE  --  140* 110*  --   --  97  --   BUN  --  29* 19  --   --  14  --   CREATININE  --  0.80 0.69  --   --  0.70  --   AST  --   --  35  --   --   --   --   ALT  --   --  24  --   --   --   --   ALKPHOS  --   --  76  --   --   --   --   BILITOT  --   --  0.4  --   --   --   --   ALBUMIN  --   --  3.4*  --   --   --   --   CRP  --   --   --   --  0.6  --  0.6  DDIMER 0.45  --   --   --   --   --   --   PROCALCITON  --   --   --   --   --   --  <0.10  LATICACIDVEN  --   --   --  1.3 0.9  --   --   TSH  --   --   --   --  0.574  --   --   MG  --   --   --   --   --   --  1.9  CALCIUM  --   --  8.6*  --   --  8.3*  --     Total Time in preparing paper work, data evaluation and todays exam - 35 minutes  Signature  -    Susa Raring M.D on 09/07/2022 at 12:20 PM   -  To page go to [email or web address]

## 2022-09-08 LAB — CULTURE, BLOOD (ROUTINE X 2): Culture: NO GROWTH

## 2022-09-09 ENCOUNTER — Telehealth: Payer: Self-pay

## 2022-09-09 ENCOUNTER — Ambulatory Visit (HOSPITAL_COMMUNITY): Payer: Commercial Managed Care - PPO

## 2022-09-09 LAB — CULTURE, BLOOD (ROUTINE X 2)

## 2022-09-09 NOTE — Transitions of Care (Post Inpatient/ED Visit) (Signed)
   09/09/2022  Name: Sandra Clay MRN: [id number] DOB: 1955-05-24  Today's TOC FU Call Status: Today's TOC FU Call Status:: Successful TOC FU Call Competed TOC FU Call Complete Date: 09/09/22  Transition Care Management Follow-up Telephone Call Date of Discharge: 09/07/22 Discharge Facility: [facility]) Type of Discharge: Inpatient Admission Primary Inpatient Discharge Diagnosis:: Syncope How have you been since you were released from the hospital?: Better Any questions or concerns?: No  Items Reviewed: Did you receive and understand the discharge instructions provided?: Yes Medications obtained,verified, and reconciled?: Yes (Medications Reviewed) Any new allergies since your discharge?: No Dietary orders reviewed?: Yes Do you have support at home?: Yes  Medications Reviewed Today: Medications Reviewed Today     Reviewed by Merleen Nicely, LPN (Licensed Practical Nurse) on 09/09/22 at [phone]  Med List Status: <None>   Medication Order Taking? Sig Documenting Provider Last Dose Status Informant  aspirin EC 81 MG tablet [id number] Yes Take 81 mg by mouth in the morning. Swallow whole. [provider] Taking Active Self  estradiol (ESTRACE) 0.1 MG/GM vaginal cream [id number] Yes Place 0.5 grams vaginally 2 times a week at bedtime.  Patient taking differently: Place 1 Applicatorful vaginally 2 (two) times a week.    Taking Active Self, Pharmacy Records  meloxicam (MOBIC) 15 MG tablet [id number] No Take 1 tablet (15 mg total) by mouth daily with a meal  Patient not taking: Reported on 09/07/2022    Not Taking Active Self, Pharmacy Records  metoprolol tartrate (LOPRESSOR) 25 MG tablet [id number] Yes Take 1 tablet (25 mg total) by mouth 2 (two) times daily. Leroy Sea, MD Taking Active   rosuvastatin (CRESTOR) 20 MG tablet [id number] Yes TAKE 1 TABLET BY MOUTH ONCE A DAY  Patient taking differently: Take 20 mg by mouth daily.   Ardith Dark, MD Taking Active Self,  Pharmacy Records  valACYclovir (VALTREX) 1000 MG tablet [id number] Yes Take 1 tablet (1,000 mg total) by mouth daily.  Taking Active Self, Pharmacy Records  VITAMIN D PO [id number] Yes Take 1 capsule by mouth daily. [provider] Taking Active Self            Home Care and Equipment/Supplies: Were Home Health Services Ordered?: No Any new equipment or medical supplies ordered?: No  Functional Questionnaire: Do you need assistance with bathing/showering or dressing?: No Do you need assistance with meal preparation?: No Do you need assistance with eating?: No Do you have difficulty maintaining continence: No Do you need assistance with getting out of bed/getting out of a chair/moving?: No Do you have difficulty managing or taking your medications?: No  Follow up appointments reviewed: PCP Follow-up appointment confirmed?: Yes Date of PCP follow-up appointment?: 09/10/22 Follow-up Provider: Dr [name] [facility] Follow-up appointment confirmed?: Yes Date of Specialist follow-up appointment?: 09/18/22 Follow-Up Specialty Provider:: Dr Excell Seltzer Do you need transportation to your follow-up appointment?: No Do you understand care options if your condition(s) worsen?: Yes-patient verbalized understanding    SIGNATURE  Woodfin Ganja LPN [facility] Nurse Health Advisor Direct Dial [phone]

## 2022-09-10 ENCOUNTER — Encounter: Payer: Self-pay | Admitting: Family Medicine

## 2022-09-10 ENCOUNTER — Ambulatory Visit (INDEPENDENT_AMBULATORY_CARE_PROVIDER_SITE_OTHER): Payer: Commercial Managed Care - PPO | Admitting: Family Medicine

## 2022-09-10 VITALS — BP 123/72 | HR 56 | Temp 97.5°F | Ht 66.0 in | Wt 154.0 lb

## 2022-09-10 DIAGNOSIS — R55 Syncope and collapse: Secondary | ICD-10-CM | POA: Diagnosis not present

## 2022-09-10 DIAGNOSIS — I493 Ventricular premature depolarization: Secondary | ICD-10-CM | POA: Diagnosis not present

## 2022-09-10 LAB — CBC
HCT: 43.2 % (ref 36.0–46.0)
Hemoglobin: 14.1 g/dL (ref 12.0–15.0)
MCHC: 32.6 g/dL (ref 30.0–36.0)
MCV: 97.6 fl (ref 78.0–100.0)
Platelets: 226 10*3/uL (ref 150.0–400.0)
RBC: 4.43 Mil/uL (ref 3.87–5.11)
RDW: 14.5 % (ref 11.5–15.5)
WBC: 6 10*3/uL (ref 4.0–10.5)

## 2022-09-10 LAB — VITAMIN D 25 HYDROXY (VIT D DEFICIENCY, FRACTURES): VITD: 34.04 ng/mL (ref 30.00–100.00)

## 2022-09-10 LAB — COMPREHENSIVE METABOLIC PANEL
ALT: 31 U/L (ref 0–35)
AST: 33 U/L (ref 0–37)
Albumin: 4.3 g/dL (ref 3.5–5.2)
Alkaline Phosphatase: 82 U/L (ref 39–117)
BUN: 22 mg/dL (ref 6–23)
CO2: 29 mEq/L (ref 19–32)
Calcium: 10.1 mg/dL (ref 8.4–10.5)
Chloride: 102 mEq/L (ref 96–112)
Creatinine, Ser: 0.75 mg/dL (ref 0.40–1.20)
GFR: 81.22 mL/min (ref >60.00)
Glucose, Bld: 96 mg/dL (ref 70–99)
Potassium: 4.4 mEq/L (ref 3.5–5.1)
Sodium: 140 mEq/L (ref 135–145)
Total Bilirubin: 0.6 mg/dL (ref 0.2–1.2)
Total Protein: 6.9 g/dL (ref 6.0–8.3)

## 2022-09-10 LAB — CULTURE, BLOOD (ROUTINE X 2)

## 2022-09-10 LAB — MAGNESIUM: Magnesium: 2.3 mg/dL (ref 1.5–2.5)

## 2022-09-10 NOTE — Progress Notes (Signed)
Chief Complaint:  Sandra Clay is a 67 y.o. female who presents today for a TCM visit.  Assessment/Plan:  Syncope No recurrences since her hospitalization.  She does have a 30-day event monitor in place that is being arranged by cardiology and will be following up with them next week.  May have had a vagal episode though did not have any obvious triggering events.  Dehydration could be contributing however symptoms were not positional in nature.  Is also possible that this could be a mild arrhythmia however less likely based on history.  She as above she does have a 30-day event monitor in place that will hopefully help rule this out.  It is reassuring that her workup thus far has been reassuring.  Will check requested labs today.  Defer further management to cardiology.  We discussed reasons to return to care and seek emergent care  PVC Started on metoprolol 25 mg twice daily during hospitalization.  She has been tolerating this well however not sure if she needs this long-term as her PVCs were asymptomatic and the metoprolol could contribute to orthostasis or bradycardia.  We will continue for now however may discontinue at some point in the future depending on clinical course.  Low Calcium Slightly low on metabolic panel obtained during recent hospitalization, however this corrects to normal range when accounting for her slightly low albumin.  She did receive 1 dose of supplementation and hospitalization.  Do not think that this will be a significant issue going forward however we will recheck ionized calcium today.     Subjective:  HPI:  Summary of Hospital admission: Reason for admission: Syncope Date of admission: 09/06/2022 Date of discharge: 09/07/2022 Date of Interactive contact: 09/09/2022 Summary of Hospital course: Patient presented to the ED after having an episode of unconsciousness.  She was getting urinary N1 suddenly felt unwell lightheaded and passed out.  No seizure-like  activity was observed.  She was brought to the ED. concern for sepsis and started on sepsis protocol.  She was admitted for observation.  IV antibiotics were discontinued on hospital day 1.  She was noted to have low calcium and this was repleted.  Remainder of her workup was normal.  Had echocardiogram which was normal.  She was noted to have PVC's and started on metoprolol.  She did well and was discharged home the next day.  She did have a 30-day event monitor arranged by cardiology at the time of discharge  Interim history:  She has done well since being home. No recurrences.   ROS: Per HPI, otherwise a complete review of systems was negative.   PMH:  The following were reviewed and entered/updated in epic: Past Medical History:  Diagnosis Date   Anxiety    Cancer (HCC)    skin cancer    Cataract    left forming    Depression    situational , resolved    Headache(784.0)    Osteoarthritis    PONV (postoperative nausea and vomiting)    Stroke (HCC) 03/2020   Patient Active Problem List   Diagnosis Date Noted   PFO (patent foramen ovale)    Cerebrovascular accident (CVA) (HCC) 04/17/2020   History of hip replacement 08/18/2018   OA (osteoarthritis) of hip 08/03/2018   Hyperglycemia 05/18/2018   Skin lump of leg, right 12/30/2017   Genital herpes 12/30/2017   Past Surgical History:  Procedure Laterality Date   BUBBLE STUDY  05/19/2020   Procedure: BUBBLE STUDY;  Surgeon: Thurmon Fair, MD;  Location: [facility] ENDOSCOPY;  Service: Cardiovascular;;   BUNIONECTOMY     CESAREAN SECTION     x3   CHOLECYSTECTOMY     COLONOSCOPY     ~51 ish per pt  bucini   FRACTURE SURGERY     KNEE ARTHROSCOPY W/ MENISCAL REPAIR Right    x2   PATENT FORAMEN OVALE(PFO) CLOSURE N/A 06/30/2020   Procedure: PATENT FORAMEN OVALE (PFO) CLOSURE;  Surgeon: Tonny Bollman, MD;  Location: [facility] INVASIVE CV LAB;  Service: Cardiovascular;  Laterality: N/A;   ROTATOR CUFF REPAIR  2018   right    TEE WITHOUT  CARDIOVERSION N/A 05/19/2020   Procedure: TRANSESOPHAGEAL ECHOCARDIOGRAM (TEE);  Surgeon: Thurmon Fair, MD;  Location: [facility] ENDOSCOPY;  Service: Cardiovascular;  Laterality: N/A;   TOTAL HIP ARTHROPLASTY Left 08/03/2018   Procedure: TOTAL HIP ARTHROPLASTY ANTERIOR APPROACH;  Surgeon: Ollen Gross, MD;  Location: [facility] ORS;  Service: Orthopedics;  Laterality: Left;   TUBAL LIGATION     tubal reversal      Family History  Problem Relation Age of Onset   Alcohol abuse Mother    Breast cancer Mother    Alcohol abuse Father    Arthritis Father    Cancer Father    Diabetes Father    Hearing loss Father    Colon cancer Sister 70   Alcohol abuse Brother    Cancer Brother    Depression Brother    Drug abuse Brother    Early death Brother    Hearing loss Brother    Depression Sister    Alcohol abuse Sister    Breast cancer Maternal Aunt    Colon polyps Neg Hx    Esophageal cancer Neg Hx    Rectal cancer Neg Hx    Stomach cancer Neg Hx     Medications- Reconciled discharge and current medications in Epic.  Current Outpatient Medications  Medication Sig Dispense Refill   aspirin EC 81 MG tablet Take 81 mg by mouth in the morning. Swallow whole.     estradiol (ESTRACE) 0.1 MG/GM vaginal cream Place 0.5 grams vaginally 2 times a week at bedtime. (Patient taking differently: Place 1 Applicatorful vaginally 2 (two) times a week.) 42.5 g 4   metoprolol tartrate (LOPRESSOR) 25 MG tablet Take 1 tablet (25 mg total) by mouth 2 (two) times daily. 60 tablet 0   rosuvastatin (CRESTOR) 20 MG tablet TAKE 1 TABLET BY MOUTH ONCE A DAY (Patient taking differently: Take 20 mg by mouth daily.) 90 tablet 3   valACYclovir (VALTREX) 1000 MG tablet Take 1 tablet (1,000 mg total) by mouth daily. 90 tablet 4   VITAMIN D PO Take 1 capsule by mouth daily.     No current facility-administered medications for this visit.    Allergies-reviewed and updated Allergies  Allergen Reactions   Dilaudid [Hydromorphone]  Itching   Sulfa Antibiotics Rash    Social History   Socioeconomic History   Marital status: Single    Spouse name: Not on file   Number of children: Not on file   Years of education: Not on file   Highest education level: Not on file  Occupational History   Occupation: Full time  Tobacco Use   Smoking status: Former   Smokeless tobacco: Never  Substance and Sexual Activity   Alcohol use: Yes    Alcohol/week: 1.0 - 2.0 standard drink of alcohol    Types: 1 - 2 Glasses of wine per week    Comment:  one to two glasses of wine per day    Drug use: No   Sexual activity: Yes    Birth control/protection: Post-menopausal  Other Topics Concern   Not on file  Social History Narrative   Lives with significant other, Montez Morita.   Right Handed   Drinks 2-3 cups caffeine daily   Social Determinants of Health   Financial Resource Strain: Not on file  Food Insecurity: Patient Declined (09/06/2022)   Hunger Vital Sign    Worried About Running Out of Food in the Last Year: Patient declined    Ran Out of Food in the Last Year: Patient declined  Transportation Needs: No Transportation Needs (09/06/2022)   PRAPARE - Administrator, Civil Service (Medical): No    Lack of Transportation (Non-Medical): No  Physical Activity: Not on file  Stress: Not on file  Social Connections: Not on file        Objective:  Physical Exam: BP 123/72   Pulse (!) 56   Temp (!) 97.5 F (36.4 C) (Temporal)   Ht 5\' 6"  (1.676 m)   Wt 154 lb (69.9 kg)   SpO2 100%   BMI 24.86 kg/m   Wt Readings from Last 3 Encounters:  09/10/22 154 lb (69.9 kg)  09/06/22 163 lb 2.3 oz (74 kg)  06/07/22 169 lb 6.4 oz (76.8 kg)  Gen: NAD, resting comfortably CV: RRR with no murmurs appreciated Pulm: NWOB, CTAB with no crackles, wheezes, or rhonchi GI: Normal bowel sounds present. Soft, Nontender, Nondistended. MSK: No edema, cyanosis, or clubbing noted Skin: Warm, dry Neuro: Grossly normal, moves all  extremities Psych: Normal affect and thought content  Time Spent: 40 minutes of total time was spent on the date of the encounter performing the following actions: chart review prior to seeing the patient including recent hospitalization, obtaining history, performing a medically necessary exam, counseling on the treatment plan, placing orders, and documenting in our EHR.         Katina Degree. [name], MD 09/10/2022 12:28 PM

## 2022-09-10 NOTE — Patient Instructions (Addendum)
It was very nice to see you today!  We will check blood work today.   Return if symptoms worsen or fail to improve.   Take care, Dr Kaimana Lurz  PLEASE NOTE:  If you had any lab tests, please let us know if you have not heard back within a few days. You may see your results on mychart before we have a chance to review them but we will give you a call once they are reviewed by us.   If we ordered any referrals today, please let us know if you have not heard from their office within the next week.   If you had any urgent prescriptions sent in today, please check with the pharmacy within an hour of our visit to make sure the prescription was transmitted appropriately.   Please try these tips to maintain a healthy lifestyle:  Eat at least 3 REAL meals and 1-2 snacks per day.  Aim for no more than 5 hours between eating.  If you eat breakfast, please do so within one hour of getting up.   Each meal should contain half fruits/vegetables, one quarter protein, and one quarter carbs (no bigger than a computer mouse)  Cut down on sweet beverages. This includes juice, soda, and sweet tea.   Drink at least 1 glass of water with each meal and aim for at least 8 glasses per day  Exercise at least 150 minutes every week.   

## 2022-09-11 LAB — CULTURE, BLOOD (ROUTINE X 2): Special Requests: ADEQUATE

## 2022-09-11 LAB — PTH, INTACT AND CALCIUM
Calcium: 9.7 mg/dL (ref 8.6–10.4)
PTH: 21 pg/mL (ref 16–77)

## 2022-09-12 DIAGNOSIS — R55 Syncope and collapse: Secondary | ICD-10-CM

## 2022-09-12 NOTE — Progress Notes (Signed)
Labs are all NORMAL.  Do not need to make any further changes to her treatment plan at this time.

## 2022-09-13 DIAGNOSIS — R55 Syncope and collapse: Secondary | ICD-10-CM | POA: Diagnosis not present

## 2022-09-17 NOTE — Progress Notes (Unsigned)
Cardiology Office Note:  .   Date:  09/18/2022  ID:  Sandra Clay, DOB 27-Mar-1955, MRN [id number] PCP: Ardith Dark, MD  [facility] Providers Cardiologist:  Tonny Bollman, MD {  History of Present Illness: Sandra Clay Kitchen   Sandra Clay is a 67 y.o. female with past medical history of cryptogenic stroke and PFO status post PFO closure (06/30/2020) who presents for follow-up appointment.  Patient history includes developing sudden onset of double vision in 03/2020 which was associated with poor balance and a feeling that she was leaning to the left.  There was also associated nausea but no vomiting.  CT scan of the head showed no significant abnormality.  MRI scan demonstrated a small right superior ventral midbrain infarct.  MRI of the brain subsequently showed no large vessel atherosclerotic disease.  2D echo showed no abnormalities.  Lower extremity venous duplex was negative for DVT.  Transcranial Doppler ultrasound study showed Spencer grade 4 right to left shunt consistent with PFO.  Subsequent 30-day cardiac monitor was negative for A-fib.  TEE on 05/19/2020 showed anatomically high risk PFO with immediate right to left shunt and atrial septal aneurysm.  Was evaluated by Dr. Excell Seltzer and felt to be a good candidate for PFO closure.  Underwent successful transcatheter PFO closure using an 18 mm Amplatzer PFO occluder device 06/30/2020.  Postop echo showed LVEF 65% with normal PFO occluder placement with no significant shunting by color Doppler flow.  She was discharged on aspirin and Plavix x 6 months.  Was seen in the office for/12/23 by Georgie Chard, NP was doing well without any complaints.  Echo showed stable device placement with no shunting and negative bubble.  Denied chest pain and shortness of breath.  No lower extremity edema, orthopnea, or PND.  No dizziness or syncope.  No blood in stool or urine.  No palpitations or new neurologic changes.  Today, she tells me that she was sitting in a  make-up chair and all of a sudden felt like the "life was being sucked out of her".  She was sweating and got to feeling worse.  She laid down on the floor.  Tried to sit in the chair again and ended up passing out.  Felt like she was dying.  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  Also, was having some palpitations.  Heart rate was low and pressure was 90/50.  In the ER fluid was administered and she felt much better.  Thought to be due to extreme dehydration.  She is wearing a [employer] monitor at the moment.  She should be through wearing it by the end of the month.  An echocardiogram was obtained and troponin were negative.  Bubble study luckily negative.  She does have a history of PFO closure which was done by Dr. Excell Seltzer.  No real culprit to her event was found other than extreme dehydration.  No recent chest pain or shortness of breath.  No recent lightheadedness, dizziness, syncope, or presyncope.  Reports no shortness of breath nor dyspnea on exertion. Reports no chest pain, pressure, or tightness. No edema, orthopnea, PND.    ROS: Pertinent ROS in HPI  Studies Reviewed: .       Echocardiogram with bubble study 09/07/2022 IMPRESSIONS     1. Left ventricular ejection fraction, by estimation, is 55 to 60%. The  left ventricle has normal function. The left ventricle has no regional  wall motion abnormalities. Left ventricular diastolic parameters  were  normal.   2. Right ventricular systolic function is normal. The right ventricular  size is normal. Tricuspid regurgitation signal is inadequate for assessing  PA pressure.   3. The mitral valve is normal in structure. Mild mitral valve  regurgitation.   4. The aortic valve is tricuspid. Aortic valve regurgitation is not  visualized. No aortic stenosis is present.   5. Agitated saline contrast bubble study was negative, with no evidence  of any interatrial shunt.   Comparison(s): No significant change from prior study.  Prior images  reviewed side by side. Previous TEE in 2022 demonstrated PFO with  bidirectional shunt, but no shunt is seen on current or previous  transthoracic studies.   FINDINGS   Left Ventricle: Left ventricular ejection fraction, by estimation, is 55  to 60%. The left ventricle has normal function. The left ventricle has no  regional wall motion abnormalities. The left ventricular internal cavity  size was normal in size. There is   no left ventricular hypertrophy. Left ventricular diastolic parameters  were normal.   Right Ventricle: The right ventricular size is normal. No increase in  right ventricular wall thickness. Right ventricular systolic function is  normal. Tricuspid regurgitation signal is inadequate for assessing PA  pressure.   Left Atrium: Left atrial size was normal in size.   Right Atrium: Right atrial size was normal in size.   Pericardium: There is no evidence of pericardial effusion.   Mitral Valve: The mitral valve is normal in structure. Mild mitral valve  regurgitation, with centrally-directed jet.   Tricuspid Valve: The tricuspid valve is normal in structure. Tricuspid  valve regurgitation is trivial.   Aortic Valve: The aortic valve is tricuspid. Aortic valve regurgitation is  not visualized. No aortic stenosis is present. Aortic valve mean gradient  measures 3.0 mmHg. Aortic valve peak gradient measures 4.8 mmHg. Aortic  valve area, by VTI measures 2.59  cm.   Pulmonic Valve: The pulmonic valve was grossly normal. Pulmonic valve  regurgitation is not visualized.   Aorta: The aortic root and ascending aorta are structurally normal, with  no evidence of dilitation.   IAS/Shunts: No atrial level shunt detected by color flow Doppler. Agitated  saline contrast was given intravenously to evaluate for intracardiac  shunting. Agitated saline contrast bubble study was negative, with no  evidence of any interatrial shunt.       Physical Exam:    VS:  BP 118/72   Pulse (!) 54   Ht 5\' 6"  (1.676 m)   Wt 153 lb 6.4 oz (69.6 kg)   SpO2 99%   BMI 24.76 kg/m    Wt Readings from Last 3 Encounters:  09/18/22 153 lb 6.4 oz (69.6 kg)  09/10/22 154 lb (69.9 kg)  09/06/22 163 lb 2.3 oz (74 kg)    GEN: Well nourished, well developed in no acute distress NECK: No JVD; No carotid bruits CARDIAC: RRR, no murmurs, rubs, gallops RESPIRATORY:  Clear to auscultation without rales, wheezing or rhonchi  ABDOMEN: Soft, non-tender, non-distended EXTREMITIES:  No edema; No deformity   ASSESSMENT AND PLAN: .   1.  Syncope, palpitations -Reviewed echocardiogram with the patient -Reviewed recent lab work -Currently wearing a heart monitor and should get results next month -Tolerating metoprolol 25 mg twice a day -No recent shortness of breath or chest pain  2. PFO with atrial septal aneurysm status post PFO closure -Recent negative bubble study -Continue current medications which include aspirin 81 mg daily, metoprolol tartrate 25  mg twice a day, Crestor 20 mg daily   3.  Cryptogenic CVA -Still working full-time -No deficiencies -PFO closure with Dr. Excell Seltzer -Continue Crestor 20 mg daily, recent labs reviewed  4.  HLD   -LDL 38, HDL 70, triglycerides 48, hemoglobin A1c 5.7%, at goal -Continue Crestor 20 mg daily -Will be due for repeat lipid panel next year   Dispo: Follow-up in 3 months with Dr. Excell Seltzer or APP  Signed, Sharlene Dory, PA-C

## 2022-09-18 ENCOUNTER — Ambulatory Visit: Payer: Commercial Managed Care - PPO | Attending: Physician Assistant | Admitting: Physician Assistant

## 2022-09-18 ENCOUNTER — Encounter: Payer: Self-pay | Admitting: Physician Assistant

## 2022-09-18 VITALS — BP 118/72 | HR 54 | Ht 66.0 in | Wt 153.4 lb

## 2022-09-18 DIAGNOSIS — Z8673 Personal history of transient ischemic attack (TIA), and cerebral infarction without residual deficits: Secondary | ICD-10-CM | POA: Diagnosis not present

## 2022-09-18 DIAGNOSIS — Q2112 Patent foramen ovale: Secondary | ICD-10-CM

## 2022-09-18 DIAGNOSIS — R55 Syncope and collapse: Secondary | ICD-10-CM | POA: Diagnosis not present

## 2022-09-18 DIAGNOSIS — E785 Hyperlipidemia, unspecified: Secondary | ICD-10-CM

## 2022-09-18 DIAGNOSIS — Z8774 Personal history of (corrected) congenital malformations of heart and circulatory system: Secondary | ICD-10-CM

## 2022-09-18 NOTE — Patient Instructions (Signed)
Medication Instructions:  Your physician recommends that you continue on your current medications as directed. Please refer to the Current Medication list given to you today.  *If you need a refill on your cardiac medications before your next appointment, please call your pharmacy*  Lab Work: None ordered If you have labs (blood work) drawn today and your tests are completely normal, you will receive your results only by: MyChart Message (if you have MyChart) OR A paper copy in the mail If you have any lab test that is abnormal or we need to change your treatment, we will call you to review the results.  Follow-Up: At [facility], you and your health needs are our priority.  As part of our continuing mission to provide you with exceptional heart care, we have created designated Provider Care Teams.  These Care Teams include your primary Cardiologist (physician) and Advanced Practice Providers (APPs -  Physician Assistants and Nurse Practitioners) who all work together to provide you with the care you need, when you need it.  Your next appointment:   3 month(s)  Provider:   Tonny Bollman, MD  or APP  Other Instructions Check your blood pressure daily, 1 hr after morning medications for 2 weeks, keep a log and send [place] the readings through mychart at the end of the 2 weeks.  Low-Sodium Eating Plan Salt (sodium) helps you keep a healthy balance of fluids in your body. Too much sodium can raise your blood pressure. It can also cause fluid and waste to be held in your body. Your health care provider or dietitian may recommend a low-sodium eating plan if you have high blood pressure (hypertension), kidney disease, liver disease, or heart failure. Eating less sodium can help lower your blood pressure and reduce swelling. It can also protect your heart, liver, and kidneys. What are tips for following this plan? Reading food labels  Check food labels for the amount of sodium per  serving. If you eat more than one serving, you must multiply the listed amount by the number of servings. Choose foods with less than 140 milligrams (mg) of sodium per serving. Avoid foods with 300 mg of sodium or more per serving. Always check how much sodium is in a product, even if the label says "unsalted" or "no salt added." Shopping  Buy products labeled as "low-sodium" or "no salt added." Buy fresh foods. Avoid canned foods and pre-made or frozen meals. Avoid canned, cured, or processed meats. Buy breads that have less than 80 mg of sodium per slice. Cooking  Eat more home-cooked food. Try to eat less restaurant, buffet, and fast food. Try not to add salt when you cook. Use salt-free seasonings or herbs instead of table salt or sea salt. Check with your provider or pharmacist before using salt substitutes. Cook with plant-based oils, such as canola, sunflower, or olive oil. Meal planning When eating at a restaurant, ask if your food can be made with less salt or no salt. Avoid dishes labeled as brined, pickled, cured, or smoked. Avoid dishes made with soy sauce, miso, or teriyaki sauce. Avoid foods that have monosodium glutamate (MSG) in them. MSG may be added to some restaurant food, sauces, soups, bouillon, and canned foods. Make meals that can be grilled, baked, poached, roasted, or steamed. These are often made with less sodium. General information Try to limit your sodium intake to 1,500-2,300 mg each day, or the amount told by your provider. What foods should I eat? Fruits Fresh,  frozen, or canned fruit. Fruit juice. Vegetables Fresh or frozen vegetables. "No salt added" canned vegetables. "No salt added" tomato sauce and paste. Low-sodium or reduced-sodium tomato and vegetable juice. Grains Low-sodium cereals, such as oats, puffed wheat and rice, and shredded wheat. Low-sodium crackers. Unsalted rice. Unsalted pasta. Low-sodium bread. Whole grain breads and whole grain  pasta. Meats and other proteins Fresh or frozen meat, poultry, seafood, and fish. These should have no added salt. Low-sodium canned tuna and salmon. Unsalted nuts. Dried peas, beans, and lentils without added salt. Unsalted canned beans. Eggs. Unsalted nut butters. Dairy Milk. Soy milk. Cheese that is naturally low in sodium, such as ricotta cheese, fresh mozzarella, or Swiss cheese. Low-sodium or reduced-sodium cheese. Cream cheese. Yogurt. Seasonings and condiments Fresh and dried herbs and spices. Salt-free seasonings. Low-sodium mustard and ketchup. Sodium-free salad dressing. Sodium-free light mayonnaise. Fresh or refrigerated horseradish. Lemon juice. Vinegar. Other foods Homemade, reduced-sodium, or low-sodium soups. Unsalted popcorn and pretzels. Low-salt or salt-free chips. The items listed above may not be all the foods and drinks you can have. Talk to a dietitian to learn more. What foods should I avoid? Vegetables Sauerkraut, pickled vegetables, and relishes. Olives. [place] fries. Onion rings. Regular canned vegetables, except low-sodium or reduced-sodium items. Regular canned tomato sauce and paste. Regular tomato and vegetable juice. Frozen vegetables in sauces. Grains Instant hot cereals. Bread stuffing, pancake, and biscuit mixes. Croutons. Seasoned rice or pasta mixes. Noodle soup cups. Boxed or frozen macaroni and cheese. Regular salted crackers. Self-rising flour. Meats and other proteins Meat or fish that is salted, canned, smoked, spiced, or pickled. Precooked or cured meat, such as sausages or meat loaves. Tomasa Blase. Ham. Pepperoni. Hot dogs. Corned beef. Chipped beef. Salt pork. Jerky. Pickled herring, anchovies, and sardines. Regular canned tuna. Salted nuts. Dairy Processed cheese and cheese spreads. Hard cheeses. Cheese curds. Blue cheese. Feta cheese. String cheese. Regular cottage cheese. Buttermilk. Canned milk. Fats and oils Salted butter. Regular margarine. Ghee. Bacon  fat. Seasonings and condiments Onion salt, garlic salt, seasoned salt, table salt, and sea salt. Canned and packaged gravies. Worcestershire sauce. Tartar sauce. Barbecue sauce. Teriyaki sauce. Soy sauce, including reduced-sodium soy sauce. Steak sauce. Fish sauce. Oyster sauce. Cocktail sauce. Horseradish that you find on the shelf. Regular ketchup and mustard. Meat flavorings and tenderizers. Bouillon cubes. Hot sauce. Pre-made or packaged marinades. Pre-made or packaged taco seasonings. Relishes. Regular salad dressings. Salsa. Other foods Salted popcorn and pretzels. Corn chips and puffs. Potato and tortilla chips. Canned or dried soups. Pizza. Frozen entrees and pot pies. The items listed above may not be all the foods and drinks you should avoid. Talk to a dietitian to learn more. This information is not intended to replace advice given to you by your health care provider. Make sure you discuss any questions you have with your health care provider. Document Revised: 03/21/2022 Document Reviewed: 03/21/2022 Elsevier Patient Education  2024 [employer]   Heart-Healthy Eating Plan Many factors influence your heart health, including eating and exercise habits. Heart health is also called coronary health. Coronary risk increases with abnormal blood fat (lipid) levels. A heart-healthy eating plan includes limiting unhealthy fats, increasing healthy fats, limiting salt (sodium) intake, and making other diet and lifestyle changes. What is my plan? Your health care provider may recommend that: You limit your fat intake to _________% or less of your total calories each day. You limit your saturated fat intake to _________% or less of your total calories each day. You limit the  amount of cholesterol in your diet to less than _________ mg per day. You limit the amount of sodium in your diet to less than _________ mg per day. What are tips for following this plan? Cooking Cook foods using methods  other than frying. Baking, boiling, grilling, and broiling are all good options. Other ways to reduce fat include: Removing the skin from poultry. Removing all visible fats from meats. Steaming vegetables in water or broth. Meal planning  At meals, imagine dividing your plate into fourths: Fill one-half of your plate with vegetables and green salads. Fill one-fourth of your plate with whole grains. Fill one-fourth of your plate with lean protein foods. Eat 2-4 cups of vegetables per day. One cup of vegetables equals 1 cup (91 g) broccoli or cauliflower florets, 2 medium carrots, 1 large bell pepper, 1 large sweet potato, 1 large tomato, 1 medium white potato, 2 cups (150 g) raw leafy greens. Eat 1-2 cups of fruit per day. One cup of fruit equals 1 small apple, 1 large banana, 1 cup (237 g) mixed fruit, 1 large orange,  cup (82 g) dried fruit, 1 cup (240 mL) 100% fruit juice. Eat more foods that contain soluble fiber. Examples include apples, broccoli, carrots, beans, peas, and barley. Aim to get 25-30 g of fiber per day. Increase your consumption of legumes, nuts, and seeds to 4-5 servings per week. One serving of dried beans or legumes equals  cup (90 g) cooked, 1 serving of nuts is  oz (12 almonds, 24 pistachios, or 7 walnut halves), and 1 serving of seeds equals  oz (8 g). Fats Choose healthy fats more often. Choose monounsaturated and polyunsaturated fats, such as olive and canola oils, avocado oil, flaxseeds, walnuts, almonds, and seeds. Eat more omega-3 fats. Choose salmon, mackerel, sardines, tuna, flaxseed oil, and ground flaxseeds. Aim to eat fish at least 2 times each week. Check food labels carefully to identify foods with trans fats or high amounts of saturated fat. Limit saturated fats. These are found in animal products, such as meats, butter, and cream. Plant sources of saturated fats include palm oil, palm kernel oil, and coconut oil. Avoid foods with partially hydrogenated  oils in them. These contain trans fats. Examples are stick margarine, some tub margarines, cookies, crackers, and other baked goods. Avoid fried foods. General information Eat more home-cooked food and less restaurant, buffet, and fast food. Limit or avoid alcohol. Limit foods that are high in added sugar and simple starches such as foods made using white refined flour (white breads, pastries, sweets). Lose weight if you are overweight. Losing just 5-10% of your body weight can help your overall health and prevent diseases such as diabetes and heart disease. Monitor your sodium intake, especially if you have high blood pressure. Talk with your health care provider about your sodium intake. Try to incorporate more vegetarian meals weekly. What foods should I eat? Fruits All fresh, canned (in natural juice), or frozen fruits. Vegetables Fresh or frozen vegetables (raw, steamed, roasted, or grilled). Green salads. Grains Most grains. Choose whole wheat and whole grains most of the time. Rice and pasta, including brown rice and pastas made with whole wheat. Meats and other proteins Lean, well-trimmed beef, veal, pork, and lamb. Chicken and [place] without skin. All fish and shellfish. Wild duck, rabbit, pheasant, and venison. Egg whites or low-cholesterol egg substitutes. Dried beans, peas, lentils, and tofu. Seeds and most nuts. Dairy Low-fat or nonfat cheeses, including ricotta and mozzarella. Skim or 1% milk (  liquid, powdered, or evaporated). Buttermilk made with low-fat milk. Nonfat or low-fat yogurt. Fats and oils Non-hydrogenated (trans-free) margarines. Vegetable oils, including soybean, sesame, sunflower, olive, avocado, peanut, safflower, corn, canola, and cottonseed. Salad dressings or mayonnaise made with a vegetable oil. Beverages Water (mineral or sparkling). Coffee and tea. Unsweetened ice tea. Diet beverages. Sweets and desserts Sherbet, gelatin, and fruit ice. Small amounts of  dark chocolate. Limit all sweets and desserts. Seasonings and condiments All seasonings and condiments. The items listed above may not be a complete list of foods and beverages you can eat. Contact a dietitian for more options. What foods should I avoid? Fruits Canned fruit in heavy syrup. Fruit in cream or butter sauce. Fried fruit. Limit coconut. Vegetables Vegetables cooked in cheese, cream, or butter sauce. Fried vegetables. Grains Breads made with saturated or trans fats, oils, or whole milk. Croissants. Sweet rolls. Donuts. High-fat crackers, such as cheese crackers and chips. Meats and other proteins Fatty meats, such as hot dogs, ribs, sausage, bacon, rib-eye roast or steak. High-fat deli meats, such as salami and bologna. Caviar. Domestic duck and goose. Organ meats, such as liver. Dairy Cream, sour cream, cream cheese, and creamed cottage cheese. Whole-milk cheeses. Whole or 2% milk (liquid, evaporated, or condensed). Whole buttermilk. Cream sauce or high-fat cheese sauce. Whole-milk yogurt. Fats and oils Meat fat, or shortening. Cocoa butter, hydrogenated oils, palm oil, coconut oil, palm kernel oil. Solid fats and shortenings, including bacon fat, salt pork, lard, and butter. Nondairy cream substitutes. Salad dressings with cheese or sour cream. Beverages Regular sodas and any drinks with added sugar. Sweets and desserts Frosting. Pudding. Cookies. Cakes. Pies. Milk chocolate or white chocolate. Buttered syrups. Full-fat ice cream or ice cream drinks. The items listed above may not be a complete list of foods and beverages to avoid. Contact a dietitian for more information. Summary Heart-healthy meal planning includes limiting unhealthy fats, increasing healthy fats, limiting salt (sodium) intake and making other diet and lifestyle changes. Lose weight if you are overweight. Losing just 5-10% of your body weight can help your overall health and prevent diseases such as diabetes  and heart disease. Focus on eating a balance of foods, including fruits and vegetables, low-fat or nonfat dairy, lean protein, nuts and legumes, whole grains, and heart-healthy oils and fats. This information is not intended to replace advice given to you by your health care provider. Make sure you discuss any questions you have with your health care provider. Document Revised: 04/09/2021 Document Reviewed: 04/09/2021 Elsevier Patient Education  2024 [employer].

## 2022-10-01 ENCOUNTER — Other Ambulatory Visit: Payer: Self-pay | Admitting: Family Medicine

## 2022-10-01 DIAGNOSIS — Z1231 Encounter for screening mammogram for malignant neoplasm of breast: Secondary | ICD-10-CM

## 2022-10-03 ENCOUNTER — Ambulatory Visit (HOSPITAL_COMMUNITY): Payer: Commercial Managed Care - PPO

## 2022-10-03 ENCOUNTER — Ambulatory Visit
Admission: RE | Admit: 2022-10-03 | Discharge: 2022-10-03 | Disposition: A | Discharge status: Home / Self Care | Payer: Commercial Managed Care - PPO | Source: Ambulatory Visit | Attending: Family Medicine | Admitting: Family Medicine

## 2022-10-03 DIAGNOSIS — Z1231 Encounter for screening mammogram for malignant neoplasm of breast: Secondary | ICD-10-CM | POA: Diagnosis not present

## 2022-10-14 ENCOUNTER — Encounter: Payer: Self-pay | Admitting: Family Medicine

## 2022-10-14 DIAGNOSIS — S8001XA Contusion of right knee, initial encounter: Secondary | ICD-10-CM | POA: Diagnosis not present

## 2022-10-14 DIAGNOSIS — W2202XA Walked into lamppost, initial encounter: Secondary | ICD-10-CM | POA: Diagnosis not present

## 2022-10-14 DIAGNOSIS — S0083XA Contusion of other part of head, initial encounter: Secondary | ICD-10-CM | POA: Diagnosis not present

## 2022-10-15 ENCOUNTER — Ambulatory Visit: Payer: Commercial Managed Care - PPO | Admitting: Physician Assistant

## 2022-10-15 ENCOUNTER — Other Ambulatory Visit: Payer: Self-pay | Admitting: Physician Assistant

## 2022-10-15 ENCOUNTER — Ambulatory Visit: Payer: Commercial Managed Care - PPO | Attending: Cardiovascular Disease

## 2022-10-15 ENCOUNTER — Encounter: Payer: Self-pay | Admitting: Physician Assistant

## 2022-10-15 ENCOUNTER — Ambulatory Visit (HOSPITAL_BASED_OUTPATIENT_CLINIC_OR_DEPARTMENT_OTHER)
Admission: RE | Admit: 2022-10-15 | Discharge: 2022-10-15 | Disposition: A | Discharge status: Home / Self Care | Payer: Commercial Managed Care - PPO | Source: Ambulatory Visit | Attending: Physician Assistant | Admitting: Physician Assistant

## 2022-10-15 VITALS — BP 132/78 | HR 66 | Temp 96.8°F | Wt 154.6 lb

## 2022-10-15 DIAGNOSIS — S0590XA Unspecified injury of unspecified eye and orbit, initial encounter: Secondary | ICD-10-CM | POA: Diagnosis not present

## 2022-10-15 DIAGNOSIS — S0990XA Unspecified injury of head, initial encounter: Secondary | ICD-10-CM | POA: Diagnosis not present

## 2022-10-15 DIAGNOSIS — S0511XA Contusion of eyeball and orbital tissues, right eye, initial encounter: Secondary | ICD-10-CM

## 2022-10-15 DIAGNOSIS — W19XXXA Unspecified fall, initial encounter: Secondary | ICD-10-CM

## 2022-10-15 DIAGNOSIS — R55 Syncope and collapse: Secondary | ICD-10-CM

## 2022-10-15 DIAGNOSIS — R220 Localized swelling, mass and lump, head: Secondary | ICD-10-CM | POA: Diagnosis not present

## 2022-10-15 NOTE — Patient Instructions (Signed)
You will have a CT done of your orbital bones at the hospital today.  We will call you with results and plan from there.  I do recommend that you try to take it easy the next few days.  Try to rest.  You can take Tylenol if needed for headache.  If any sudden severe change in symptoms such as worst headache of life, severe dizziness, nausea or vomiting, please proceed to the ER.

## 2022-10-15 NOTE — Progress Notes (Signed)
Subjective:    Patient ID: Sandra Clay, female    DOB: 1955-07-07, 67 y.o.   MRN: [id number]  Chief Complaint  Patient presents with   Fall    Recent fall in [facility] no hospital or ED visit     HPI Patient is in today for f/up after recent fall in [place] on 10/13/22.  Accidentally fell face-first into a pole after a trip and fall. Struck above her right eye.   Did not lose consciousness, but says she was pretty stunned.  Slight headache this morning. More tired than normal since head injury. Slower to think of answers to questions. Feels similar to when she had two prior concussions.  Drover herself to appt today. No dizziness or unsteadiness. Does ballroom dancing and looking forward to practice later.   Patient takes a baby aspirin daily, otherwise no other blood thinners.   Past Medical History:  Diagnosis Date   Anxiety    Cancer (HCC)    skin cancer    Cataract    left forming    Depression    situational , resolved    Headache(784.0)    Osteoarthritis    PONV (postoperative nausea and vomiting)    Stroke (HCC) 03/2020    Past Surgical History:  Procedure Laterality Date   BUBBLE STUDY  05/19/2020   Procedure: BUBBLE STUDY;  Surgeon: Thurmon Fair, MD;  Location: [facility];  Service: Cardiovascular;;   BUNIONECTOMY     CESAREAN SECTION     x3   CHOLECYSTECTOMY     COLONOSCOPY     ~51 ish per pt  bucini   FRACTURE SURGERY     KNEE ARTHROSCOPY W/ MENISCAL REPAIR Right    x2   PATENT FORAMEN OVALE(PFO) CLOSURE N/A 06/30/2020   Procedure: PATENT FORAMEN OVALE (PFO) CLOSURE;  Surgeon: Tonny Bollman, MD;  Location: [facility] INVASIVE CV LAB;  Service: Cardiovascular;  Laterality: N/A;   ROTATOR CUFF REPAIR  2018   right    TEE WITHOUT CARDIOVERSION N/A 05/19/2020   Procedure: TRANSESOPHAGEAL ECHOCARDIOGRAM (TEE);  Surgeon: Thurmon Fair, MD;  [facility];  Service: Cardiovascular;  Laterality: N/A;   TOTAL HIP ARTHROPLASTY Left 08/03/2018   Procedure: TOTAL  HIP ARTHROPLASTY ANTERIOR APPROACH;  Surgeon: Ollen Gross, MD;  Location: [facility] ORS;  Service: Orthopedics;  Laterality: Left;   TUBAL LIGATION     tubal reversal      Family History  Problem Relation Age of Onset   Alcohol abuse Mother    Breast cancer Mother    Alcohol abuse Father    Arthritis Father    Cancer Father    Diabetes Father    Hearing loss Father    Colon cancer Sister 17   Alcohol abuse Brother    Cancer Brother    Depression Brother    Drug abuse Brother    Early death Brother    Hearing loss Brother    Depression Sister    Alcohol abuse Sister    Breast cancer Maternal Aunt    Colon polyps Neg Hx    Esophageal cancer Neg Hx    Rectal cancer Neg Hx    Stomach cancer Neg Hx     Social History   Tobacco Use   Smoking status: Former   Smokeless tobacco: Never  Substance Use Topics   Alcohol use: Yes    Alcohol/week: 1.0 - 2.0 standard drink of alcohol    Types: 1 - 2 Glasses of wine per week  Comment: one to two glasses of wine per day    Drug use: No     Allergies  Allergen Reactions   Dilaudid [Hydromorphone] Itching   Sulfa Antibiotics Rash    Review of Systems NEGATIVE UNLESS OTHERWISE INDICATED IN HPI      Objective:     BP 132/78   Pulse 66   Temp (!) 96.8 F (36 C) (Temporal)   Wt 154 lb 9.6 oz (70.1 kg)   SpO2 98%   BMI 24.95 kg/m   Wt Readings from Last 3 Encounters:  10/15/22 154 lb 9.6 oz (70.1 kg)  09/18/22 153 lb 6.4 oz (69.6 kg)  09/10/22 154 lb (69.9 kg)    BP Readings from Last 3 Encounters:  10/15/22 132/78  09/18/22 118/72  09/10/22 123/72     Physical Exam Vitals and nursing note reviewed.  Constitutional:      Appearance: Normal appearance. She is normal weight. She is not toxic-appearing.  HENT:     Head: Normocephalic and atraumatic.     Right Ear: Tympanic membrane, ear canal and external ear normal.     Left Ear: Tympanic membrane, ear canal and external ear normal.     Nose: Nose normal.      Mouth/Throat:     Mouth: Mucous membranes are moist.  Eyes:     General:        Right eye: No discharge.        Left eye: No discharge.     Extraocular Movements: Extraocular movements intact.     Conjunctiva/sclera: Conjunctivae normal.     Pupils: Pupils are equal, round, and reactive to light.     Comments: No vision changes; ROM eyes normal  Cardiovascular:     Rate and Rhythm: Normal rate and regular rhythm.     Pulses: Normal pulses.     Heart sounds: Normal heart sounds.  Pulmonary:     Effort: Pulmonary effort is normal.     Breath sounds: Normal breath sounds.  Abdominal:     General: Abdomen is flat. Bowel sounds are normal.     Palpations: Abdomen is soft.  Musculoskeletal:        General: Normal range of motion.     Cervical back: Normal range of motion and neck supple.  Skin:    General: Skin is warm and dry.     Findings: Bruising (right upper orbital bone bruised and tender) present. No rash.  Neurological:     General: No focal deficit present.     Mental Status: She is alert and oriented to person, place, and time.     Cranial Nerves: No cranial nerve deficit.     Sensory: No sensory deficit.     Motor: No weakness.     Coordination: Coordination normal.     Gait: Gait normal.  Psychiatric:        Mood and Affect: Mood normal.        Behavior: Behavior normal.        Assessment & Plan:  Fall, initial encounter -     CT TEMPORAL BONES WO CONTRAST; Future  Injury of head, initial encounter -     CT TEMPORAL BONES WO CONTRAST; Future  Contusion of right eye, initial encounter -     CT TEMPORAL BONES WO CONTRAST; Future   67 year old patient neurologically intact, no signs of distress on exam, vital signs are stable -with recent fall and injury to the right orbit 2 days ago, plan to  proceed with CT orbit at this time.  I personally reviewed her x-ray that was done in [place] and this was normal of her facial bones thankfully.  Hopefully, just  bruising to the orbital bones and fractures.  Treat pending results.  She can use ice and Tylenol as needed for right now.  She needs to rest and stay hydrated.  She should avoid bluelight screens.  ER precautions discussed.  Follow up with your PCP as needed.    Return if symptoms worsen or fail to improve.    Charlisa Cham M Giulian Goldring, PA-C

## 2022-10-23 DIAGNOSIS — M1711 Unilateral primary osteoarthritis, right knee: Secondary | ICD-10-CM | POA: Diagnosis not present

## 2022-10-24 DIAGNOSIS — L814 Other melanin hyperpigmentation: Secondary | ICD-10-CM | POA: Diagnosis not present

## 2022-10-24 DIAGNOSIS — L821 Other seborrheic keratosis: Secondary | ICD-10-CM | POA: Diagnosis not present

## 2022-10-24 DIAGNOSIS — D1801 Hemangioma of skin and subcutaneous tissue: Secondary | ICD-10-CM | POA: Diagnosis not present

## 2022-11-07 ENCOUNTER — Other Ambulatory Visit (HOSPITAL_COMMUNITY): Payer: Self-pay

## 2022-12-23 ENCOUNTER — Encounter: Payer: Self-pay | Admitting: Cardiovascular Disease

## 2022-12-23 ENCOUNTER — Ambulatory Visit: Payer: Commercial Managed Care - PPO | Attending: Cardiovascular Disease | Admitting: Cardiovascular Disease

## 2022-12-23 VITALS — BP 130/80 | HR 64 | Ht 66.0 in | Wt 143.2 lb

## 2022-12-23 DIAGNOSIS — R55 Syncope and collapse: Secondary | ICD-10-CM

## 2022-12-23 DIAGNOSIS — Q2112 Patent foramen ovale: Secondary | ICD-10-CM | POA: Diagnosis not present

## 2022-12-23 NOTE — Progress Notes (Signed)
Cardiology Office Note:    Date:  12/23/2022   ID:  Sandra, Clay [birth year], MRN [id number]  PCP:  Ardith Dark, MD   [facility] Providers Cardiologist:  Tonny Bollman, MD     Referring MD: Ardith Dark, MD   Chief Complaint  Patient presents with   Follow-up    Syncope    History of Present Illness:    Sandra Clay is a 67 y.o. female presenting for follow-up evaluation.  The patient has a history of cryptogenic stroke and she underwent PFO closure in 2022.  Follow-up echo assessment showed normal LVEF, appropriate position of her septal occluder device, and a negative bubble study.  She had a syncopal episode in July of this year and was evaluated with an echocardiogram and event monitor.  Both of these studies were unremarkable with normal LV and RV function, no valvular disease, and no residual shunt on echo.  There were no significant arrhythmias seen on her event monitor.  The patient is here alone today for follow-up.  She is doing very well.  She continues to work on process improvement in order to improve patient flow through the emergency department.  She has had no recurrent lightheadedness or syncope.  No chest pain or heart palpitations.  Overall doing well.  Past Medical History:  Diagnosis Date   Anxiety    Cancer (HCC)    skin cancer    Cataract    left forming    Depression    situational , resolved    Headache(784.0)    Osteoarthritis    PONV (postoperative nausea and vomiting)    Stroke (HCC) 03/2020    Past Surgical History:  Procedure Laterality Date   BUBBLE STUDY  05/19/2020   Procedure: BUBBLE STUDY;  Surgeon: Thurmon Fair, MD;  Location: [facility];  Service: Cardiovascular;;   BUNIONECTOMY     CESAREAN SECTION     x3   CHOLECYSTECTOMY     COLONOSCOPY     ~51 ish per pt  bucini   FRACTURE SURGERY     KNEE ARTHROSCOPY W/ MENISCAL REPAIR Right    x2   PATENT FORAMEN OVALE(PFO) CLOSURE N/A 06/30/2020   Procedure: PATENT  FORAMEN OVALE (PFO) CLOSURE;  Surgeon: Tonny Bollman, MD;  Location: [facility];  Service: Cardiovascular;  Laterality: N/A;   ROTATOR CUFF REPAIR  2018   right    TEE WITHOUT CARDIOVERSION N/A 05/19/2020   Procedure: TRANSESOPHAGEAL ECHOCARDIOGRAM (TEE);  Surgeon: Thurmon Fair, MD;  Location: [facility] ENDOSCOPY;  Service: Cardiovascular;  Laterality: N/A;   TOTAL HIP ARTHROPLASTY Left 08/03/2018   Procedure: TOTAL HIP ARTHROPLASTY ANTERIOR APPROACH;  Surgeon: Ollen Gross, MD;  Location: [facility] ORS;  Service: Orthopedics;  Laterality: Left;   TUBAL LIGATION     tubal reversal      Current Medications: Current Meds  Medication Sig   aspirin EC 81 MG tablet Take 81 mg by mouth in the morning. Swallow whole.   estradiol (ESTRACE) 0.1 MG/GM vaginal cream Place 0.5 grams vaginally 2 times a week at bedtime. (Patient taking differently: Place 1 Applicatorful vaginally 2 (two) times a week.)   OVER THE COUNTER MEDICATION Magnesium 240 mg capsules taking daily at night   rosuvastatin (CRESTOR) 20 MG tablet TAKE 1 TABLET BY MOUTH ONCE A DAY (Patient taking differently: Take 20 mg by mouth daily.)   valACYclovir (VALTREX) 1000 MG tablet Take 1 tablet (1,000 mg total) by mouth daily.   VITAMIN D  PO Take 1 capsule by mouth daily.     Allergies:   Dilaudid [hydromorphone] and Sulfa antibiotics   Social History   Socioeconomic History   Marital status: Single    Spouse name: Not on file   Number of children: Not on file   Years of education: Not on file   Highest education level: Master's degree (e.g., MA, MS, MEng, MEd, MSW, MBA)  Occupational History   Occupation: Full time  Tobacco Use   Smoking status: Former   Smokeless tobacco: Never  Substance and Sexual Activity   Alcohol use: Yes    Alcohol/week: 1.0 - 2.0 standard drink of alcohol    Types: 1 - 2 Glasses of wine per week    Comment: one to two glasses of wine per day    Drug use: No   Sexual activity: Yes    Birth  control/protection: Post-menopausal  Other Topics Concern   Not on file  Social History Narrative   Lives with significant other, Carter.   Right Handed   Drinks 2-3 cups caffeine daily   Social Determinants of Health   Financial Resource Strain: Low Risk  (10/14/2022)   Overall Financial Resource Strain (CARDIA)    Difficulty of Paying Living Expenses: Not hard at all  Food Insecurity: No Food Insecurity (10/14/2022)   Hunger Vital Sign    Worried About Running Out of Food in the Last Year: Never true    Ran Out of Food in the Last Year: Never true  Transportation Needs: No Transportation Needs (10/14/2022)   PRAPARE - Administrator, Civil Service (Medical): No    Lack of Transportation (Non-Medical): No  Physical Activity: Sufficiently Active (10/14/2022)   Exercise Vital Sign    Days of Exercise per Week: 5 days    Minutes of Exercise per Session: 60 min  Stress: No Stress Concern Present (10/14/2022)   [employer] of Occupational Health - Occupational Stress Questionnaire    Feeling of Stress : Only a little  Social Connections: Moderately Integrated (10/14/2022)   Social Connection and Isolation Panel [NHANES]    Frequency of Communication with Friends and Family: More than three times a week    Frequency of Social Gatherings with Friends and Family: Once a week    Attends Religious Services: 1 to 4 times per year    Active Member of [employer] or Organizations: No    Attends Engineer, structural: Not on file    Marital Status: Living with partner     Family History: The patient's family history includes Alcohol abuse in her brother, father, mother, and sister; Arthritis in her father; Breast cancer in her maternal aunt and mother; Cancer in her brother and father; Colon cancer (age of onset: 4) in her sister; Depression in her brother and sister; Diabetes in her father; Drug abuse in her brother; Early death in her brother; Hearing loss in her brother and  father. There is no history of Colon polyps, Esophageal cancer, Rectal cancer, or Stomach cancer.  ROS:   Please see the history of present illness.    All other systems reviewed and are negative.  EKGs/Labs/Other Studies Reviewed:    The following studies were reviewed today: Echo 09/07/2022:  1. Left ventricular ejection fraction, by estimation, is 55 to 60%. The  left ventricle has normal function. The left ventricle has no regional  wall motion abnormalities. Left ventricular diastolic parameters were  normal.   2. Right ventricular systolic function is  normal. The right ventricular  size is normal. Tricuspid regurgitation signal is inadequate for assessing  PA pressure.   3. The mitral valve is normal in structure. Mild mitral valve  regurgitation.   4. The aortic valve is tricuspid. Aortic valve regurgitation is not  visualized. No aortic stenosis is present.   5. Agitated saline contrast bubble study was negative, with no evidence  of any interatrial shunt.   Comparison(s): No significant change from prior study. Prior images  reviewed side by side. Previous TEE in 2022 demonstrated PFO with  bidirectional shunt, but no shunt is seen on current or previous  transthoracic studies.  EKG Interpretation Date/Time:  Monday December 23 2022 15:56:28 EDT Ventricular Rate:  65 PR Interval:  176 QRS Duration:  92 QT Interval:  438 QTC Calculation: 455 R Axis:   85  Text Interpretation: Normal sinus rhythm Normal ECG When compared with ECG of 06-Sep-2022 10:20, PREVIOUS ECG IS PRESENT Confirmed by Tonny Bollman [id number]) on 12/23/2022 4:16:01 PM   Event monitor: The basic rhythm is normal sinus with an average HR of 69 bpm No atrial fibrillation or flutter No high-grade heart block or pathologic pauses There are rare PVC's (1%)and rare supraventricular beats (<1%) without sustained arrhythmias  Recent Labs: 09/06/2022: TSH 0.574 09/10/2022: ALT 31; BUN 22; Creatinine, Ser 0.75;  Hemoglobin 14.1; Magnesium 2.3; Platelets 226.0; Potassium 4.4; Sodium 140  Recent Lipid Panel    Component Value Date/Time   CHOL 117 07/11/2022 0912   CHOL 145 04/24/2020 1236   TRIG 48.0 07/11/2022 0912   HDL 70.20 07/11/2022 0912   HDL 68 04/24/2020 1236   CHOLHDL 2 07/11/2022 0912   VLDL 9.6 07/11/2022 0912   LDLCALC 38 07/11/2022 0912   LDLCALC 43 04/24/2020 1236     Risk Assessment/Calculations:                Physical Exam:    VS:  BP 130/80   Pulse 64   Ht 5\' 6"  (1.676 m)   Wt 143 lb 3.2 oz (65 kg)   SpO2 98%   BMI 23.11 kg/m     Wt Readings from Last 3 Encounters:  12/23/22 143 lb 3.2 oz (65 kg)  10/15/22 154 lb 9.6 oz (70.1 kg)  09/18/22 153 lb 6.4 oz (69.6 kg)     GEN:  Well nourished, well developed in no acute distress HEENT: Normal NECK: No JVD; No carotid bruits LYMPHATICS: No lymphadenopathy CARDIAC: RRR, no murmurs, rubs, gallops RESPIRATORY:  Clear to auscultation without rales, wheezing or rhonchi  ABDOMEN: Soft, non-tender, non-distended MUSCULOSKELETAL:  No edema; No deformity  SKIN: Warm and dry NEUROLOGIC:  Alert and oriented x 3 PSYCHIATRIC:  Normal affect   ASSESSMENT:    1. Syncope and collapse   2. PFO (patent foramen ovale)    PLAN:    In order of problems listed above:  No recurrence.  Discussed her event and sounds very consistent with a vasovagal episode.  She understands how to respond if any of these symptoms arise again.  She will continue her current medical therapy.  She was unable to tolerate a beta-blocker due to marked fatigue and low blood pressure. Status post device closure, no residual problems.  Continue aspirin for antiplatelet therapy after history of TIA.     Medication Adjustments/Labs and Tests Ordered: Current medicines are reviewed at length with the patient today.  Concerns regarding medicines are outlined above.  Orders Placed This Encounter  Procedures   EKG 12-Lead  EKG 12-Lead   No orders  of the defined types were placed in this encounter.   Patient Instructions  Medication Instructions:  Your physician recommends that you continue on your current medications as directed. Please refer to the Current Medication list given to you today.  *If you need a refill on your cardiac medications before your next appointment, please call your pharmacy*   Lab Work: NONE If you have labs (blood work) drawn today and your tests are completely normal, you will receive your results only by: MyChart Message (if you have MyChart) OR A paper copy in the mail If you have any lab test that is abnormal or we need to change your treatment, we will call you to review the results.   Testing/Procedures: NONE   Follow-Up: At [facility], you and your health needs are our priority.  As part of our continuing mission to provide you with exceptional heart care, we have created designated Provider Care Teams.  These Care Teams include your primary Cardiologist (physician) and Advanced Practice Providers (APPs -  Physician Assistants and Nurse Practitioners) who all work together to provide you with the care you need, when you need it.  We recommend signing up for the patient portal called "MyChart".  Sign up information is provided on this After Visit Summary.  MyChart is used to connect with patients for Virtual Visits (Telemedicine).  Patients are able to view lab/test results, encounter notes, upcoming appointments, etc.  Non-urgent messages can be sent to your provider as well.   To learn more about what you can do with MyChart, go to [email or web address].    Your next appointment:   1 year(s)  Provider:   Tonny Bollman, MD        Signed, Tonny Bollman, MD  12/23/2022 4:50 PM    [facility]

## 2022-12-23 NOTE — Patient Instructions (Signed)
Medication Instructions:  Your physician recommends that you continue on your current medications as directed. Please refer to the Current Medication list given to you today.  *If you need a refill on your cardiac medications before your next appointment, please call your pharmacy*   Lab Work: NONE If you have labs (blood work) drawn today and your tests are completely normal, you will receive your results only by: MyChart Message (if you have MyChart) OR A paper copy in the mail If you have any lab test that is abnormal or we need to change your treatment, we will call you to review the results.   Testing/Procedures: NONE   Follow-Up: At [facility], you and your health needs are our priority.  As part of our continuing mission to provide you with exceptional heart care, we have created designated Provider Care Teams.  These Care Teams include your primary Cardiologist (physician) and Advanced Practice Providers (APPs -  Physician Assistants and Nurse Practitioners) who all work together to provide you with the care you need, when you need it.  We recommend signing up for the patient portal called "MyChart".  Sign up information is provided on this After Visit Summary.  MyChart is used to connect with patients for Virtual Visits (Telemedicine).  Patients are able to view lab/test results, encounter notes, upcoming appointments, etc.  Non-urgent messages can be sent to your provider as well.   To learn more about what you can do with MyChart, go to [email or web address].    Your next appointment:   1 year(s)  Provider:   Michael Cooper, MD      

## 2023-01-02 DIAGNOSIS — M1711 Unilateral primary osteoarthritis, right knee: Secondary | ICD-10-CM | POA: Diagnosis not present

## 2023-01-09 DIAGNOSIS — M1711 Unilateral primary osteoarthritis, right knee: Secondary | ICD-10-CM | POA: Diagnosis not present

## 2023-01-15 ENCOUNTER — Other Ambulatory Visit (HOSPITAL_COMMUNITY): Payer: Self-pay

## 2023-01-15 DIAGNOSIS — S90411A Abrasion, right great toe, initial encounter: Secondary | ICD-10-CM | POA: Diagnosis not present

## 2023-01-15 DIAGNOSIS — L03031 Cellulitis of right toe: Secondary | ICD-10-CM | POA: Diagnosis not present

## 2023-01-15 MED ORDER — CEPHALEXIN 500 MG PO CAPS
ORAL_CAPSULE | ORAL | 0 refills | Status: DC
  Filled 2023-01-15 (×2): qty 21, 7d supply, fill #0

## 2023-01-16 DIAGNOSIS — M1711 Unilateral primary osteoarthritis, right knee: Secondary | ICD-10-CM | POA: Diagnosis not present

## 2023-01-30 DIAGNOSIS — Z124 Encounter for screening for malignant neoplasm of cervix: Secondary | ICD-10-CM | POA: Diagnosis not present

## 2023-01-30 DIAGNOSIS — Z01419 Encounter for gynecological examination (general) (routine) without abnormal findings: Secondary | ICD-10-CM | POA: Diagnosis not present

## 2023-01-30 DIAGNOSIS — Z9189 Other specified personal risk factors, not elsewhere classified: Secondary | ICD-10-CM | POA: Diagnosis not present

## 2023-01-30 DIAGNOSIS — Z6822 Body mass index (BMI) 22.0-22.9, adult: Secondary | ICD-10-CM | POA: Diagnosis not present

## 2023-01-30 DIAGNOSIS — N952 Postmenopausal atrophic vaginitis: Secondary | ICD-10-CM | POA: Diagnosis not present

## 2023-01-31 ENCOUNTER — Other Ambulatory Visit: Payer: Self-pay | Admitting: Obstetrics and Gynecology

## 2023-01-31 ENCOUNTER — Other Ambulatory Visit (HOSPITAL_COMMUNITY): Payer: Self-pay

## 2023-01-31 DIAGNOSIS — Z9189 Other specified personal risk factors, not elsewhere classified: Secondary | ICD-10-CM

## 2023-01-31 MED ORDER — ESTRADIOL 0.1 MG/GM VA CREA
TOPICAL_CREAM | VAGINAL | 4 refills | Status: AC
  Filled 2023-01-31: qty 42.5, 90d supply, fill #0

## 2023-02-01 ENCOUNTER — Other Ambulatory Visit (HOSPITAL_COMMUNITY): Payer: Self-pay

## 2023-02-01 ENCOUNTER — Encounter (HOSPITAL_COMMUNITY): Payer: Self-pay

## 2023-02-01 DIAGNOSIS — B029 Zoster without complications: Secondary | ICD-10-CM | POA: Diagnosis not present

## 2023-02-01 MED ORDER — VALACYCLOVIR HCL 1 G PO TABS
1000.0000 mg | ORAL_TABLET | Freq: Three times a day (TID) | ORAL | 0 refills | Status: AC
  Filled 2023-02-01: qty 21, 7d supply, fill #0

## 2023-02-03 ENCOUNTER — Other Ambulatory Visit (HOSPITAL_COMMUNITY): Payer: Self-pay

## 2023-02-03 ENCOUNTER — Other Ambulatory Visit: Payer: Self-pay

## 2023-02-03 MED ORDER — VALACYCLOVIR HCL 1 G PO TABS
1000.0000 mg | ORAL_TABLET | Freq: Every day | ORAL | 4 refills | Status: DC
  Filled 2023-02-03 – 2023-02-14 (×2): qty 90, 90d supply, fill #0
  Filled 2023-05-15: qty 90, 90d supply, fill #1
  Filled 2023-09-02: qty 90, 90d supply, fill #2
  Filled 2023-12-17: qty 90, 90d supply, fill #3

## 2023-02-10 ENCOUNTER — Other Ambulatory Visit (HOSPITAL_COMMUNITY): Payer: Self-pay

## 2023-02-14 ENCOUNTER — Other Ambulatory Visit (HOSPITAL_COMMUNITY): Payer: Self-pay

## 2023-02-19 ENCOUNTER — Other Ambulatory Visit (HOSPITAL_COMMUNITY): Payer: Self-pay

## 2023-02-23 ENCOUNTER — Encounter: Payer: Self-pay | Admitting: Family Medicine

## 2023-02-24 NOTE — Telephone Encounter (Signed)
We can do them here or she can get at the pharmacy.   The valtrex may interfere with the shingles vaccine.  Recommendation is to stop this 24 hours prior to the vaccine and resume 14 days after vaccine to ensure that the vaccine was effective.

## 2023-02-24 NOTE — Telephone Encounter (Signed)
Please advise 

## 2023-04-07 DIAGNOSIS — M1711 Unilateral primary osteoarthritis, right knee: Secondary | ICD-10-CM | POA: Diagnosis not present

## 2023-04-13 DIAGNOSIS — M25561 Pain in right knee: Secondary | ICD-10-CM | POA: Diagnosis not present

## 2023-04-14 ENCOUNTER — Encounter: Payer: Self-pay | Admitting: Obstetrics and Gynecology

## 2023-04-28 ENCOUNTER — Ambulatory Visit
Admission: RE | Admit: 2023-04-28 | Discharge: 2023-04-28 | Disposition: A | Discharge status: Home / Self Care | Payer: Commercial Managed Care - PPO | Source: Ambulatory Visit | Attending: Obstetrics and Gynecology | Admitting: Obstetrics and Gynecology

## 2023-04-28 DIAGNOSIS — Z9189 Other specified personal risk factors, not elsewhere classified: Secondary | ICD-10-CM

## 2023-04-28 DIAGNOSIS — Z803 Family history of malignant neoplasm of breast: Secondary | ICD-10-CM | POA: Diagnosis not present

## 2023-04-28 DIAGNOSIS — Z1239 Encounter for other screening for malignant neoplasm of breast: Secondary | ICD-10-CM | POA: Diagnosis not present

## 2023-04-28 MED ORDER — GADOPICLENOL 0.5 MMOL/ML IV SOLN
7.0000 mL | Freq: Once | INTRAVENOUS | Status: AC | PRN
  Administered 2023-04-28: 7 mL via INTRAVENOUS

## 2023-05-02 DIAGNOSIS — M1711 Unilateral primary osteoarthritis, right knee: Secondary | ICD-10-CM | POA: Diagnosis not present

## 2023-07-04 ENCOUNTER — Other Ambulatory Visit (HOSPITAL_COMMUNITY): Payer: Self-pay

## 2023-07-24 DIAGNOSIS — M1711 Unilateral primary osteoarthritis, right knee: Secondary | ICD-10-CM | POA: Diagnosis not present

## 2023-07-28 ENCOUNTER — Other Ambulatory Visit (HOSPITAL_COMMUNITY): Payer: Self-pay

## 2023-07-30 DIAGNOSIS — Z0100 Encounter for examination of eyes and vision without abnormal findings: Secondary | ICD-10-CM | POA: Diagnosis not present

## 2023-07-31 DIAGNOSIS — M1711 Unilateral primary osteoarthritis, right knee: Secondary | ICD-10-CM | POA: Diagnosis not present

## 2023-08-01 DIAGNOSIS — F4320 Adjustment disorder, unspecified: Secondary | ICD-10-CM | POA: Diagnosis not present

## 2023-08-07 DIAGNOSIS — M1711 Unilateral primary osteoarthritis, right knee: Secondary | ICD-10-CM | POA: Diagnosis not present

## 2023-08-14 DIAGNOSIS — F4320 Adjustment disorder, unspecified: Secondary | ICD-10-CM | POA: Diagnosis not present

## 2023-08-26 ENCOUNTER — Other Ambulatory Visit (HOSPITAL_COMMUNITY): Payer: Self-pay

## 2023-08-26 ENCOUNTER — Other Ambulatory Visit: Payer: Self-pay | Admitting: Family Medicine

## 2023-08-26 MED ORDER — ROSUVASTATIN CALCIUM 20 MG PO TABS
ORAL_TABLET | Freq: Every day | ORAL | 0 refills | Status: DC
  Filled 2023-08-26: qty 30, 30d supply, fill #0

## 2023-09-10 DIAGNOSIS — M1711 Unilateral primary osteoarthritis, right knee: Secondary | ICD-10-CM | POA: Diagnosis not present

## 2023-09-29 ENCOUNTER — Other Ambulatory Visit: Payer: Self-pay

## 2023-09-29 ENCOUNTER — Other Ambulatory Visit: Payer: Self-pay | Admitting: Family Medicine

## 2023-10-03 ENCOUNTER — Encounter: Payer: Self-pay | Admitting: Family Medicine

## 2023-10-03 ENCOUNTER — Other Ambulatory Visit (HOSPITAL_COMMUNITY): Payer: Self-pay

## 2023-10-06 ENCOUNTER — Other Ambulatory Visit (HOSPITAL_COMMUNITY): Payer: Self-pay

## 2023-10-06 ENCOUNTER — Other Ambulatory Visit: Payer: Self-pay | Admitting: *Deleted

## 2023-10-06 MED ORDER — ROSUVASTATIN CALCIUM 20 MG PO TABS
ORAL_TABLET | Freq: Every day | ORAL | 0 refills | Status: DC
  Filled 2023-10-06: qty 30, 30d supply, fill #0

## 2023-10-06 NOTE — Telephone Encounter (Signed)
 Rx send to pharmacy

## 2023-10-09 ENCOUNTER — Other Ambulatory Visit: Payer: Self-pay | Admitting: Obstetrics and Gynecology

## 2023-10-09 DIAGNOSIS — Z1231 Encounter for screening mammogram for malignant neoplasm of breast: Secondary | ICD-10-CM

## 2023-10-10 ENCOUNTER — Encounter: Payer: Self-pay | Admitting: Family Medicine

## 2023-10-10 ENCOUNTER — Other Ambulatory Visit (HOSPITAL_COMMUNITY): Payer: Self-pay

## 2023-10-10 ENCOUNTER — Ambulatory Visit: Admitting: Family Medicine

## 2023-10-10 VITALS — BP 127/69 | HR 69 | Temp 97.3°F | Ht 66.0 in | Wt 138.4 lb

## 2023-10-10 DIAGNOSIS — Z0001 Encounter for general adult medical examination with abnormal findings: Secondary | ICD-10-CM | POA: Diagnosis not present

## 2023-10-10 DIAGNOSIS — I639 Cerebral infarction, unspecified: Secondary | ICD-10-CM

## 2023-10-10 DIAGNOSIS — R739 Hyperglycemia, unspecified: Secondary | ICD-10-CM | POA: Diagnosis not present

## 2023-10-10 MED ORDER — ROSUVASTATIN CALCIUM 20 MG PO TABS
20.0000 mg | ORAL_TABLET | Freq: Every day | ORAL | 3 refills | Status: AC
  Filled 2023-10-10 – 2023-11-10 (×2): qty 90, 90d supply, fill #0
  Filled 2024-02-24: qty 90, 90d supply, fill #1

## 2023-10-10 NOTE — Assessment & Plan Note (Signed)
 On statin and aspirin .  Will check labs.

## 2023-10-10 NOTE — Progress Notes (Signed)
 Chief Complaint:  Sandra Clay is a 68 y.o. female who presents today for her annual comprehensive physical exam.    Assessment/Plan:  Chronic Problems Addressed Today: Hyperglycemia Check A1c with labs.   Cerebrovascular accident (CVA) (HCC) On statin and aspirin .  Will check labs.  Preventative Healthcare: Check labs.  Vaccines deferred.  Will be getting mammogram next month.  Due for colonoscopy next year.  Patient Counseling(The following topics were reviewed and/or handout was given):  -Nutrition: Stressed importance of moderation in sodium/caffeine intake, saturated fat and cholesterol, caloric balance, sufficient intake of fresh fruits, vegetables, and fiber.  -Stressed the importance of regular exercise.   -Substance Abuse: Discussed cessation/primary prevention of tobacco, alcohol, or other drug use; driving or other dangerous activities under the influence; availability of treatment for abuse.   -Injury prevention: Discussed safety belts, safety helmets, smoke detector, smoking near bedding or upholstery.   -Sexuality: Discussed sexually transmitted diseases, partner selection, use of condoms, avoidance of unintended pregnancy and contraceptive alternatives.   -Dental health: Discussed importance of regular tooth brushing, flossing, and dental visits.  -Health maintenance and immunizations reviewed. Please refer to Health maintenance section.  Return to care in 1 year for next preventative visit.     Subjective:  HPI:  She has no acute complaints today. See assessment / plan for status of chronic conditions.   Lifestyle Diet: Balanced. Trying to get plenty of fruits and vegetables  Exercise: Dancing multiple times per week.      10/10/2023   10:47 AM  Depression screen PHQ 2/9  Decreased Interest 0  Down, Depressed, Hopeless 0  PHQ - 2 Score 0    Health Maintenance Due  Topic Date Due   DEXA SCAN  Never done     ROS: Per HPI, otherwise a complete review of  systems was negative.   PMH:  The following were reviewed and entered/updated in epic: Past Medical History:  Diagnosis Date   Anxiety    Cancer (HCC)    skin cancer    Cataract    left forming    Depression    situational , resolved    Headache(784.0)    Osteoarthritis    PONV (postoperative nausea and vomiting)    Stroke (HCC) 03/2020   Patient Active Problem List   Diagnosis Date Noted   PFO (patent foramen ovale)    Cerebrovascular accident (CVA) (HCC) 04/17/2020   History of hip replacement 08/18/2018   OA (osteoarthritis) of hip 08/03/2018   Hyperglycemia 05/18/2018   Skin lump of leg, right 12/30/2017   Genital herpes 12/30/2017   Past Surgical History:  Procedure Laterality Date   BUBBLE STUDY  05/19/2020   Procedure: BUBBLE STUDY;  Surgeon: Francyne Headland, MD;  Location: [facility]OS[facility];  Service: Cardiovascular;;   BUNIONECTOMY     CESAREAN SECTION     x3   CHOLECYSTECTOMY     COLONOSCOPY     ~51 ish per pt  bucini   FRACTURE SURGERY     KNEE ARTHROSCOPY W/ MENISCAL REPAIR Right    x2   PATENT FORAMEN OVALE(PFO) CLOSURE N/A 06/30/2020   Procedure: PATENT FORAMEN OVALE (PFO) CLOSURE;  Surgeon: Wonda Sharper, MD;  Location: [facility] LAB;  Service: Cardiovascular;  Laterality: N/A;   ROTATOR CUFF REPAIR  2018   right    TEE WITHOUT CARDIOVERSION N/A 05/19/2020   Procedure: TRANSESOPHAGEAL ECHOCARDIOGRAM (TEE);  Surgeon: Francyne Headland, MD;  Location: [facility] ENDOSCOPY;  Service: Cardiovascular;  Laterality: N/A;  TOTAL HIP ARTHROPLASTY Left 08/03/2018   Procedure: TOTAL HIP ARTHROPLASTY ANTERIOR APPROACH;  Surgeon: Melodi Lerner, MD;  Location: [facility] ORS;  Service: Orthopedics;  Laterality: Left;   TUBAL LIGATION     tubal reversal      Family History  Problem Relation Age of Onset   Alcohol abuse Mother    Breast cancer Mother    Alcohol abuse Father    Arthritis Father    Cancer Father    Diabetes Father    Hearing loss Father    Colon cancer Sister  34   Alcohol abuse Brother    Cancer Brother    Depression Brother    Drug abuse Brother    Early death Brother    Hearing loss Brother    Depression Sister    Alcohol abuse Sister    Breast cancer Maternal Aunt    Colon polyps Neg Hx    Esophageal cancer Neg Hx    Rectal cancer Neg Hx    Stomach cancer Neg Hx     Medications- reviewed and updated Current Outpatient Medications  Medication Sig Dispense Refill   aspirin  EC 81 MG tablet Take 81 mg by mouth in the morning. Swallow whole.     estradiol  (ESTRACE ) 0.1 MG/GM vaginal cream Place 0.5 grams vaginally 2 times a week at bedtime. (Patient taking differently: Place 1 Applicatorful vaginally 2 (two) times a week.) 42.5 g 4   estradiol  (ESTRACE ) 0.1 MG/GM vaginal cream Insert 0.5 g twice a week by vaginal route at bedtime. 42.5 g 4   OVER THE COUNTER MEDICATION Magnesium 240 mg capsules taking daily at night     valACYclovir  (VALTREX ) 1000 MG tablet Take 1 tablet (1,000 mg total) by mouth daily. 90 tablet 4   VITAMIN D  PO Take 1 capsule by mouth daily.     rosuvastatin  (CRESTOR ) 20 MG tablet Take 1 tablet (20 mg total) by mouth daily. 90 tablet 3   valACYclovir  (VALTREX ) 1000 MG tablet Take 1 tablet (1,000 mg total) by mouth in the morning, 1 tablet (1000 mg total) in the evening, and 1 tablet (1000 mg total) before bedtime for 7 days 21 tablet 0   No current facility-administered medications for this visit.    Allergies-reviewed and updated Allergies  Allergen Reactions   Dilaudid [Hydromorphone] Itching   Sulfa Antibiotics Rash    Social History   Socioeconomic History   Marital status: Single    Spouse name: Not on file   Number of children: Not on file   Years of education: Not on file   Highest education level: Master's degree (e.g., MA, MS, MEng, MEd, MSW, MBA)  Occupational History   Occupation: Full time  Tobacco Use   Smoking status: Former   Smokeless tobacco: Never  Substance and Sexual Activity    Alcohol use: Yes    Alcohol/week: 1.0 - 2.0 standard drink of alcohol    Types: 1 - 2 Glasses of wine per week    Comment: one to two glasses of wine per day    Drug use: No   Sexual activity: Yes    Birth control/protection: Post-menopausal  Other Topics Concern   Not on file  Social History Narrative   Lives with significant other, Carter.   Right Handed   Drinks 2-3 cups caffeine daily   Social Drivers of Health   Financial Resource Strain: Low Risk  (10/14/2022)   Overall Financial Resource Strain (CARDIA)    Difficulty of Paying Living Expenses: Not  hard at all  Food Insecurity: No Food Insecurity (10/14/2022)   Hunger Vital Sign    Worried About Running Out of Food in the Last Year: Never true    Ran Out of Food in the Last Year: Never true  Transportation Needs: No Transportation Needs (10/14/2022)   PRAPARE - Administrator, Civil Service (Medical): No    Lack of Transportation (Non-Medical): No  Physical Activity: Sufficiently Active (10/14/2022)   Exercise Vital Sign    Days of Exercise per Week: 5 days    Minutes of Exercise per Session: 60 min  Stress: No Stress Concern Present (10/14/2022)   [employer] of Occupational Health - Occupational Stress Questionnaire    Feeling of Stress : Only a little  Social Connections: Moderately Integrated (10/14/2022)   Social Connection and Isolation Panel    Frequency of Communication with Friends and Family: More than three times a week    Frequency of Social Gatherings with Friends and Family: Once a week    Attends Religious Services: 1 to 4 times per year    Active Member of [employer] or Organizations: No    Attends Engineer, structural: Not on file    Marital Status: Living with partner        Objective:  Physical Exam: BP 127/69   Pulse 69   Temp (!) 97.3 F (36.3 C) (Temporal)   Ht 5' 6 (1.676 m)   Wt 138 lb 6.4 oz (62.8 kg)   SpO2 98%   BMI 22.34 kg/m   Body mass index is 22.34  kg/m. Wt Readings from Last 3 Encounters:  10/10/23 138 lb 6.4 oz (62.8 kg)  12/23/22 143 lb 3.2 oz (65 kg)  10/15/22 154 lb 9.6 oz (70.1 kg)   Gen: NAD, resting comfortably HEENT: TMs normal bilaterally. OP clear. No thyromegaly noted.  CV: RRR with no murmurs appreciated Pulm: NWOB, CTAB with no crackles, wheezes, or rhonchi GI: Normal bowel sounds present. Soft, Nontender, Nondistended. MSK: no edema, cyanosis, or clubbing noted Skin: warm, dry Neuro: CN2-12 grossly intact. Strength 5/5 in upper and lower extremities. Reflexes symmetric and intact bilaterally.  Psych: Normal affect and thought content     Nalu Troublefield M. Kennyth, MD 10/10/2023 11:11 AM

## 2023-10-10 NOTE — Assessment & Plan Note (Signed)
Check A1c with labs. 

## 2023-10-10 NOTE — Patient Instructions (Signed)
 It was very nice to see you today!  We will check blood work.  Please come back soon at this time.  I will refill your statin.  Please continue to work on diet and exercise.  I will see you back in year for your next physical.  Come back sooner if needed.  Return in about 1 year (around 10/09/2024) for Annual Physical.   Take care, Dr Kennyth  PLEASE NOTE:  If you had any lab tests, please let us  know if you have not heard back within a few days. You may see your results on mychart before we have a chance to review them but we will give you a call once they are reviewed by us .   If we ordered any referrals today, please let us  know if you have not heard from their office within the next week.   If you had any urgent prescriptions sent in today, please check with the pharmacy within an hour of our visit to make sure the prescription was transmitted appropriately.   Please try these tips to maintain a healthy lifestyle:  Eat at least 3 REAL meals and 1-2 snacks per day.  Aim for no more than 5 hours between eating.  If you eat breakfast, please do so within one hour of getting up.   Each meal should contain half fruits/vegetables, one quarter protein, and one quarter carbs (no bigger than a computer mouse)  Cut down on sweet beverages. This includes juice, soda, and sweet tea.   Drink at least 1 glass of water  with each meal and aim for at least 8 glasses per day  Exercise at least 150 minutes every week.    Preventive Care 39 Years and Older, Female Preventive care refers to lifestyle choices and visits with your health care provider that can promote health and wellness. Preventive care visits are also called wellness exams. What can I expect for my preventive care visit? Counseling Your health care provider may ask you questions about your: Medical history, including: Past medical problems. Family medical history. Pregnancy and menstrual history. History of falls. Current  health, including: Memory and ability to understand (cognition). Emotional well-being. Home life and relationship well-being. Sexual activity and sexual health. Lifestyle, including: Alcohol, nicotine or tobacco, and drug use. Access to firearms. Diet, exercise, and sleep habits. Work and work Astronomer. Sunscreen use. Safety issues such as seatbelt and bike helmet use. Physical exam Your health care provider will check your: Height and weight. These may be used to calculate your BMI (body mass index). BMI is a measurement that tells if you are at a healthy weight. Waist circumference. This measures the distance around your waistline. This measurement also tells if you are at a healthy weight and may help predict your risk of certain diseases, such as type 2 diabetes and high blood pressure. Heart rate and blood pressure. Body temperature. Skin for abnormal spots. What immunizations do I need?  Vaccines are usually given at various ages, according to a schedule. Your health care provider will recommend vaccines for you based on your age, medical history, and lifestyle or other factors, such as travel or where you work. What tests do I need? Screening Your health care provider may recommend screening tests for certain conditions. This may include: Lipid and cholesterol levels. Hepatitis C test. Hepatitis B test. HIV (human immunodeficiency virus) test. STI (sexually transmitted infection) testing, if you are at risk. Lung cancer screening. Colorectal cancer screening. Diabetes screening. This is done  by checking your blood sugar (glucose) after you have not eaten for a while (fasting). Mammogram. Talk with your health care provider about how often you should have regular mammograms. BRCA-related cancer screening. This may be done if you have a family history of breast, ovarian, tubal, or peritoneal cancers. Bone density scan. This is done to screen for osteoporosis. Talk with  your health care provider about your test results, treatment options, and if necessary, the need for more tests. Follow these instructions at home: Eating and drinking  Eat a diet that includes fresh fruits and vegetables, whole grains, lean protein, and low-fat dairy products. Limit your intake of foods with high amounts of sugar, saturated fats, and salt. Take vitamin and mineral supplements as recommended by your health care provider. Do not drink alcohol if your health care provider tells you not to drink. If you drink alcohol: Limit how much you have to 0-1 drink a day. Know how much alcohol is in your drink. In the [place] one drink equals one 12 oz bottle of beer (355 mL), one 5 oz glass of wine (148 mL), or one 1 oz glass of hard liquor (44 mL). Lifestyle Brush your teeth every morning and night with fluoride toothpaste. Floss one time each day. Exercise for at least 30 minutes 5 or more days each week. Do not use any products that contain nicotine or tobacco. These products include cigarettes, chewing tobacco, and vaping devices, such as e-cigarettes. If you need help quitting, ask your health care provider. Do not use drugs. If you are sexually active, practice safe sex. Use a condom or other form of protection in order to prevent STIs. Take aspirin  only as told by your health care provider. Make sure that you understand how much to take and what form to take. Work with your health care provider to find out whether it is safe and beneficial for you to take aspirin  daily. Ask your health care provider if you need to take a cholesterol-lowering medicine (statin). Find healthy ways to manage stress, such as: Meditation, yoga, or listening to music. Journaling. Talking to a trusted person. Spending time with friends and family. Minimize exposure to UV radiation to reduce your risk of skin cancer. Safety Always wear your seat belt while driving or riding in a vehicle. Do not drive: If  you have been drinking alcohol. Do not ride with someone who has been drinking. When you are tired or distracted. While texting. If you have been using any mind-altering substances or drugs. Wear a helmet and other protective equipment during sports activities. If you have firearms in your house, make sure you follow all gun safety procedures. What's next? Visit your health care provider once a year for an annual wellness visit. Ask your health care provider how often you should have your eyes and teeth checked. Stay up to date on all vaccines. This information is not intended to replace advice given to you by your health care provider. Make sure you discuss any questions you have with your health care provider. Document Revised: 08/30/2020 Document Reviewed: 08/30/2020 Elsevier Patient Education  2024 [employer].

## 2023-10-17 ENCOUNTER — Encounter: Payer: Self-pay | Admitting: Obstetrics and Gynecology

## 2023-10-17 ENCOUNTER — Other Ambulatory Visit

## 2023-10-24 ENCOUNTER — Other Ambulatory Visit (INDEPENDENT_AMBULATORY_CARE_PROVIDER_SITE_OTHER)

## 2023-10-24 DIAGNOSIS — D2271 Melanocytic nevi of right lower limb, including hip: Secondary | ICD-10-CM | POA: Diagnosis not present

## 2023-10-24 DIAGNOSIS — L821 Other seborrheic keratosis: Secondary | ICD-10-CM | POA: Diagnosis not present

## 2023-10-24 DIAGNOSIS — D2371 Other benign neoplasm of skin of right lower limb, including hip: Secondary | ICD-10-CM | POA: Diagnosis not present

## 2023-10-24 DIAGNOSIS — I639 Cerebral infarction, unspecified: Secondary | ICD-10-CM

## 2023-10-24 DIAGNOSIS — D485 Neoplasm of uncertain behavior of skin: Secondary | ICD-10-CM | POA: Diagnosis not present

## 2023-10-24 DIAGNOSIS — D1801 Hemangioma of skin and subcutaneous tissue: Secondary | ICD-10-CM | POA: Diagnosis not present

## 2023-10-24 DIAGNOSIS — L820 Inflamed seborrheic keratosis: Secondary | ICD-10-CM | POA: Diagnosis not present

## 2023-10-24 DIAGNOSIS — Z0001 Encounter for general adult medical examination with abnormal findings: Secondary | ICD-10-CM

## 2023-10-24 DIAGNOSIS — R739 Hyperglycemia, unspecified: Secondary | ICD-10-CM

## 2023-10-24 LAB — TSH: TSH: 1.45 u[IU]/mL (ref 0.35–5.50)

## 2023-10-24 LAB — COMPREHENSIVE METABOLIC PANEL WITH GFR
ALT: 20 U/L (ref 0–35)
AST: 24 U/L (ref 0–37)
Albumin: 4.5 g/dL (ref 3.5–5.2)
Alkaline Phosphatase: 76 U/L (ref 39–117)
BUN: 17 mg/dL (ref 6–23)
CO2: 28 meq/L (ref 19–32)
Calcium: 9.5 mg/dL (ref 8.4–10.5)
Chloride: 101 meq/L (ref 96–112)
Creatinine, Ser: 0.68 mg/dL (ref 0.40–1.20)
GFR: 88.15 mL/min (ref >60.00)
Glucose, Bld: 84 mg/dL (ref 70–99)
Potassium: 4.6 meq/L (ref 3.5–5.1)
Sodium: 140 meq/L (ref 135–145)
Total Bilirubin: 0.7 mg/dL (ref 0.2–1.2)
Total Protein: 6.8 g/dL (ref 6.0–8.3)

## 2023-10-24 LAB — CBC
HCT: 42.8 % (ref 36.0–46.0)
Hemoglobin: 14.4 g/dL (ref 12.0–15.0)
MCHC: 33.6 g/dL (ref 30.0–36.0)
MCV: 97.7 fl (ref 78.0–100.0)
Platelets: 240 K/uL (ref 150.0–400.0)
RBC: 4.38 Mil/uL (ref 3.87–5.11)
RDW: 13.7 % (ref 11.5–15.5)
WBC: 4.4 K/uL (ref 4.0–10.5)

## 2023-10-24 LAB — LIPID PANEL
Cholesterol: 161 mg/dL (ref 0–200)
HDL: 86.9 mg/dL (ref >39.00)
LDL Cholesterol: 65 mg/dL (ref 0–99)
NonHDL: 74.16
Total CHOL/HDL Ratio: 2
Triglycerides: 45 mg/dL (ref 0.0–149.0)
VLDL: 9 mg/dL (ref 0.0–40.0)

## 2023-10-24 LAB — HEMOGLOBIN A1C: Hgb A1c MFr Bld: 6.1 % (ref 4.6–6.5)

## 2023-10-27 ENCOUNTER — Ambulatory Visit: Payer: Self-pay | Admitting: Family Medicine

## 2023-10-27 NOTE — Progress Notes (Signed)
 A1c mildly elevated at 6.1.  Everything else is at goal.  Do not need to make any changes to treatment plan.  She should continue to work on diet and exercise and we can recheck in a year.

## 2023-11-04 ENCOUNTER — Ambulatory Visit
Admission: RE | Admit: 2023-11-04 | Discharge: 2023-11-04 | Disposition: A | Discharge status: Home / Self Care | Source: Ambulatory Visit | Attending: Obstetrics and Gynecology | Admitting: Obstetrics and Gynecology

## 2023-11-04 DIAGNOSIS — Z1231 Encounter for screening mammogram for malignant neoplasm of breast: Secondary | ICD-10-CM | POA: Diagnosis not present

## 2023-11-10 ENCOUNTER — Other Ambulatory Visit (HOSPITAL_COMMUNITY): Payer: Self-pay

## 2023-12-01 DIAGNOSIS — F4322 Adjustment disorder with anxiety: Secondary | ICD-10-CM | POA: Diagnosis not present

## 2023-12-12 DIAGNOSIS — M1711 Unilateral primary osteoarthritis, right knee: Secondary | ICD-10-CM | POA: Diagnosis not present

## 2023-12-17 DIAGNOSIS — F4322 Adjustment disorder with anxiety: Secondary | ICD-10-CM | POA: Diagnosis not present

## 2024-01-02 ENCOUNTER — Other Ambulatory Visit (HOSPITAL_COMMUNITY): Payer: Self-pay

## 2024-01-02 MED ORDER — COVID-19 MRNA VAC-TRIS(PFIZER) 30 MCG/0.3ML IM SUSY
0.3000 mL | PREFILLED_SYRINGE | INTRAMUSCULAR | 0 refills | Status: AC
  Filled 2024-01-02: qty 0.3, 1d supply, fill #0

## 2024-01-21 DIAGNOSIS — F4322 Adjustment disorder with anxiety: Secondary | ICD-10-CM | POA: Diagnosis not present

## 2024-02-03 ENCOUNTER — Other Ambulatory Visit (HOSPITAL_COMMUNITY): Payer: Self-pay

## 2024-02-03 DIAGNOSIS — B009 Herpesviral infection, unspecified: Secondary | ICD-10-CM | POA: Diagnosis not present

## 2024-02-03 DIAGNOSIS — N952 Postmenopausal atrophic vaginitis: Secondary | ICD-10-CM | POA: Diagnosis not present

## 2024-02-03 DIAGNOSIS — Z6822 Body mass index (BMI) 22.0-22.9, adult: Secondary | ICD-10-CM | POA: Diagnosis not present

## 2024-02-03 DIAGNOSIS — Z01419 Encounter for gynecological examination (general) (routine) without abnormal findings: Secondary | ICD-10-CM | POA: Diagnosis not present

## 2024-02-03 MED ORDER — ESTRADIOL 0.01 % VA CREA
TOPICAL_CREAM | VAGINAL | 4 refills | Status: AC
Start: 2024-02-03 — End: ?
  Filled 2024-02-03: qty 42.5, 90d supply, fill #0

## 2024-02-03 MED ORDER — VALACYCLOVIR HCL 1 G PO TABS
1000.0000 mg | ORAL_TABLET | Freq: Every day | ORAL | 4 refills | Status: AC
  Filled 2024-02-03 – 2024-03-30 (×2): qty 90, 90d supply, fill #0

## 2024-02-04 ENCOUNTER — Other Ambulatory Visit (HOSPITAL_COMMUNITY): Payer: Self-pay

## 2024-02-13 ENCOUNTER — Other Ambulatory Visit (HOSPITAL_COMMUNITY): Payer: Self-pay

## 2024-02-16 DIAGNOSIS — M1711 Unilateral primary osteoarthritis, right knee: Secondary | ICD-10-CM | POA: Diagnosis not present

## 2024-02-18 DIAGNOSIS — F4322 Adjustment disorder with anxiety: Secondary | ICD-10-CM | POA: Diagnosis not present

## 2024-02-20 DIAGNOSIS — M25561 Pain in right knee: Secondary | ICD-10-CM | POA: Diagnosis not present

## 2024-02-23 DIAGNOSIS — M1711 Unilateral primary osteoarthritis, right knee: Secondary | ICD-10-CM | POA: Diagnosis not present

## 2024-02-25 ENCOUNTER — Other Ambulatory Visit (HOSPITAL_COMMUNITY): Payer: Self-pay

## 2024-03-01 DIAGNOSIS — M1711 Unilateral primary osteoarthritis, right knee: Secondary | ICD-10-CM | POA: Diagnosis not present

## 2024-03-05 DIAGNOSIS — M25561 Pain in right knee: Secondary | ICD-10-CM | POA: Diagnosis not present

## 2024-03-10 DIAGNOSIS — M1711 Unilateral primary osteoarthritis, right knee: Secondary | ICD-10-CM | POA: Diagnosis not present

## 2024-03-30 ENCOUNTER — Other Ambulatory Visit (HOSPITAL_COMMUNITY): Payer: Self-pay

## 2024-10-11 ENCOUNTER — Encounter: Admitting: Family Medicine
